# Patient Record
Sex: Male | Born: 1950 | Race: White | Hispanic: No | Smoking: Never smoker
Health system: Southern US, Community
[De-identification: ages and names within clinical notes are randomized; demographics above are authoritative.]

## PROBLEM LIST (undated history)

## (undated) DIAGNOSIS — R51 Headache: Secondary | ICD-10-CM

## (undated) DIAGNOSIS — G8929 Other chronic pain: Secondary | ICD-10-CM

## (undated) DIAGNOSIS — R413 Other amnesia: Secondary | ICD-10-CM

## (undated) DIAGNOSIS — M1990 Unspecified osteoarthritis, unspecified site: Secondary | ICD-10-CM

## (undated) DIAGNOSIS — M549 Dorsalgia, unspecified: Secondary | ICD-10-CM

## (undated) DIAGNOSIS — K922 Gastrointestinal hemorrhage, unspecified: Secondary | ICD-10-CM

## (undated) DIAGNOSIS — Z8719 Personal history of other diseases of the digestive system: Secondary | ICD-10-CM

## (undated) DIAGNOSIS — Z8711 Personal history of peptic ulcer disease: Secondary | ICD-10-CM

## (undated) DIAGNOSIS — F329 Major depressive disorder, single episode, unspecified: Secondary | ICD-10-CM

## (undated) DIAGNOSIS — Z8709 Personal history of other diseases of the respiratory system: Secondary | ICD-10-CM

## (undated) DIAGNOSIS — K219 Gastro-esophageal reflux disease without esophagitis: Secondary | ICD-10-CM

## (undated) DIAGNOSIS — T7840XA Allergy, unspecified, initial encounter: Secondary | ICD-10-CM

## (undated) DIAGNOSIS — Z9289 Personal history of other medical treatment: Secondary | ICD-10-CM

## (undated) DIAGNOSIS — E66813 Obesity, class 3: Secondary | ICD-10-CM

## (undated) DIAGNOSIS — F32A Depression, unspecified: Secondary | ICD-10-CM

## (undated) DIAGNOSIS — I10 Essential (primary) hypertension: Secondary | ICD-10-CM

## (undated) HISTORY — DX: Allergy, unspecified, initial encounter: T78.40XA

## (undated) HISTORY — PX: HAND SURGERY: SHX662

## (undated) HISTORY — PX: ANKLE SURGERY: SHX546

## (undated) HISTORY — PX: LEG SURGERY: SHX1003

## (undated) HISTORY — DX: Major depressive disorder, single episode, unspecified: F32.9

## (undated) HISTORY — DX: Essential (primary) hypertension: I10

## (undated) HISTORY — PX: TIBIA FRACTURE SURGERY: SHX806

## (undated) HISTORY — DX: Morbid (severe) obesity due to excess calories: E66.01

## (undated) HISTORY — DX: Obesity, class 3: E66.813

## (undated) HISTORY — PX: VASECTOMY: SHX75

## (undated) HISTORY — PX: OTHER SURGICAL HISTORY: SHX169

## (undated) HISTORY — PX: FRACTURE SURGERY: SHX138

## (undated) HISTORY — DX: Depression, unspecified: F32.A

## (undated) HISTORY — DX: Personal history of other diseases of the respiratory system: Z87.09

## (undated) HISTORY — PX: BACK SURGERY: SHX140

## (undated) HISTORY — DX: Headache: R51

## (undated) HISTORY — DX: Other amnesia: R41.3

## (undated) HISTORY — PX: TONSILLECTOMY: SUR1361

## (undated) SURGERY — Surgical Case
Anesthesia: *Unknown

---

## 1987-04-01 DIAGNOSIS — K922 Gastrointestinal hemorrhage, unspecified: Secondary | ICD-10-CM

## 1987-04-01 HISTORY — DX: Gastrointestinal hemorrhage, unspecified: K92.2

## 2006-01-02 ENCOUNTER — Ambulatory Visit (HOSPITAL_COMMUNITY): Admission: RE | Admit: 2006-01-02 | Discharge: 2006-01-02 | Payer: Self-pay | Admitting: Anesthesiology

## 2007-07-12 ENCOUNTER — Observation Stay (HOSPITAL_COMMUNITY): Admission: EM | Admit: 2007-07-12 | Discharge: 2007-07-16 | Payer: Self-pay | Admitting: Emergency Medicine

## 2007-07-13 ENCOUNTER — Ambulatory Visit: Payer: Self-pay | Admitting: Surgery

## 2007-07-13 ENCOUNTER — Encounter (INDEPENDENT_AMBULATORY_CARE_PROVIDER_SITE_OTHER): Payer: Self-pay | Admitting: Internal Medicine

## 2007-07-15 ENCOUNTER — Encounter
Admission: RE | Admit: 2007-07-15 | Discharge: 2007-07-15 | Payer: Self-pay | Admitting: Physical Medicine & Rehabilitation

## 2009-12-09 ENCOUNTER — Inpatient Hospital Stay (HOSPITAL_COMMUNITY)
Admission: EM | Admit: 2009-12-09 | Discharge: 2009-12-17 | Disposition: A | Discharge status: Other Acute Inpatient Hospital | Payer: Self-pay | Source: Home / Self Care | Admitting: Emergency Medicine

## 2009-12-09 ENCOUNTER — Ambulatory Visit: Payer: Self-pay | Admitting: Pulmonary Disease

## 2009-12-17 ENCOUNTER — Ambulatory Visit: Payer: Self-pay | Admitting: Psychiatry

## 2009-12-17 ENCOUNTER — Inpatient Hospital Stay (HOSPITAL_COMMUNITY): Admission: EM | Admit: 2009-12-17 | Discharge: 2009-12-19 | Payer: Self-pay | Admitting: Psychiatry

## 2009-12-18 ENCOUNTER — Emergency Department (HOSPITAL_COMMUNITY)
Admission: EM | Admit: 2009-12-18 | Discharge: 2009-12-18 | Disposition: A | Discharge status: Other Acute Inpatient Hospital | Payer: Self-pay | Source: Home / Self Care | Admitting: Emergency Medicine

## 2010-02-13 ENCOUNTER — Ambulatory Visit: Payer: Self-pay | Admitting: Psychiatry

## 2010-06-13 LAB — BASIC METABOLIC PANEL
BUN: 27 mg/dL — ABNORMAL HIGH (ref 6–23)
BUN: 46 mg/dL — ABNORMAL HIGH (ref 6–23)
BUN: 56 mg/dL — ABNORMAL HIGH (ref 6–23)
BUN: 67 mg/dL — ABNORMAL HIGH (ref 6–23)
CO2: 19 mEq/L (ref 19–32)
CO2: 14 mEq/L — ABNORMAL LOW (ref 19–32)
CO2: 16 mEq/L — ABNORMAL LOW (ref 19–32)
CO2: 31 mEq/L (ref 19–32)
CO2: 33 mEq/L — ABNORMAL HIGH (ref 19–32)
Calcium: 8.9 mg/dL (ref 8.4–10.5)
Calcium: 7.3 mg/dL — ABNORMAL LOW (ref 8.4–10.5)
Calcium: 8 mg/dL — ABNORMAL LOW (ref 8.4–10.5)
Chloride: 107 mEq/L (ref 96–112)
Chloride: 103 mEq/L (ref 96–112)
Chloride: 107 mEq/L (ref 96–112)
Chloride: 113 mEq/L — ABNORMAL HIGH (ref 96–112)
Creatinine, Ser: 2.54 mg/dL — ABNORMAL HIGH (ref 0.4–1.5)
Creatinine, Ser: 4.18 mg/dL — ABNORMAL HIGH (ref 0.4–1.5)
Creatinine, Ser: 4.58 mg/dL — ABNORMAL HIGH (ref 0.4–1.5)
Creatinine, Ser: 4.63 mg/dL — ABNORMAL HIGH (ref 0.4–1.5)
GFR calc Af Amer: 16 mL/min — ABNORMAL LOW (ref >60)
GFR calc Af Amer: 21 mL/min — ABNORMAL LOW (ref >60)
GFR calc Af Amer: 32 mL/min — ABNORMAL LOW (ref >60)
GFR calc non Af Amer: 15 mL/min — ABNORMAL LOW (ref >60)
Glucose, Bld: 162 mg/dL — ABNORMAL HIGH (ref 70–99)
Glucose, Bld: 131 mg/dL — ABNORMAL HIGH (ref 70–99)
Glucose, Bld: 163 mg/dL — ABNORMAL HIGH (ref 70–99)
Glucose, Bld: 193 mg/dL — ABNORMAL HIGH (ref 70–99)
Potassium: 3.5 mEq/L (ref 3.5–5.1)
Potassium: 4.2 mEq/L (ref 3.5–5.1)
Sodium: 138 mEq/L (ref 135–145)
Sodium: 141 mEq/L (ref 135–145)

## 2010-06-13 LAB — CBC
HCT: 38.4 % — ABNORMAL LOW (ref 39.0–52.0)
HCT: 30.7 % — ABNORMAL LOW (ref 39.0–52.0)
HCT: 33.3 % — ABNORMAL LOW (ref 39.0–52.0)
HCT: 45.8 % (ref 39.0–52.0)
Hemoglobin: 13.4 g/dL (ref 13.0–17.0)
Hemoglobin: 12.8 g/dL — ABNORMAL LOW (ref 13.0–17.0)
Hemoglobin: 14.7 g/dL (ref 13.0–17.0)
MCH: 29.7 pg (ref 26.0–34.0)
MCH: 29.9 pg (ref 26.0–34.0)
MCH: 30.1 pg (ref 26.0–34.0)
MCHC: 33.6 g/dL (ref 30.0–36.0)
MCHC: 33.6 g/dL (ref 30.0–36.0)
MCHC: 32.1 g/dL (ref 30.0–36.0)
MCV: 91 fL (ref 78.0–100.0)
MCV: 86.7 fL (ref 78.0–100.0)
MCV: 88.6 fL (ref 78.0–100.0)
MCV: 93.9 fL (ref 78.0–100.0)
Platelets: 148 10*3/uL — ABNORMAL LOW (ref 150–400)
Platelets: 161 10*3/uL (ref 150–400)
Platelets: 138 10*3/uL — ABNORMAL LOW (ref 150–400)
Platelets: 226 K/uL (ref 150–400)
RBC: 3.54 MIL/uL — ABNORMAL LOW (ref 4.22–5.81)
RBC: 4.31 MIL/uL (ref 4.22–5.81)
RBC: 4.88 MIL/uL (ref 4.22–5.81)
RDW: 12.9 % (ref 11.5–15.5)
RDW: 13.3 % (ref 11.5–15.5)
RDW: 13.6 % (ref 11.5–15.5)
RDW: 13.8 % (ref 11.5–15.5)
WBC: 8.2 10*3/uL (ref 4.0–10.5)
WBC: 10.5 10*3/uL (ref 4.0–10.5)
WBC: 14.3 10*3/uL — ABNORMAL HIGH (ref 4.0–10.5)
WBC: 9.1 10*3/uL (ref 4.0–10.5)
WBC: 9.6 10*3/uL (ref 4.0–10.5)

## 2010-06-13 LAB — RAPID URINE DRUG SCREEN, HOSP PERFORMED
Amphetamines: NOT DETECTED
Barbiturates: NOT DETECTED
Benzodiazepines: POSITIVE — AB
Cocaine: NOT DETECTED
Opiates: POSITIVE — AB
Tetrahydrocannabinol: NOT DETECTED

## 2010-06-13 LAB — POCT I-STAT 3, ART BLOOD GAS (G3+)
Acid-Base Excess: 11 mmol/L — ABNORMAL HIGH (ref 0.0–2.0)
Acid-base deficit: 13 mmol/L — ABNORMAL HIGH (ref 0.0–2.0)
Acid-base deficit: 15 mmol/L — ABNORMAL HIGH (ref 0.0–2.0)
Acid-base deficit: 16 mmol/L — ABNORMAL HIGH (ref 0.0–2.0)
Acid-base deficit: 16 mmol/L — ABNORMAL HIGH (ref 0.0–2.0)
Acid-base deficit: 8 mmol/L — ABNORMAL HIGH (ref 0.0–2.0)
Bicarbonate: 12.1 mEq/L — ABNORMAL LOW (ref 20.0–24.0)
Bicarbonate: 13.9 meq/L — ABNORMAL LOW (ref 20.0–24.0)
Bicarbonate: 14.4 meq/L — ABNORMAL LOW (ref 20.0–24.0)
Bicarbonate: 14.6 mEq/L — ABNORMAL LOW (ref 20.0–24.0)
Bicarbonate: 15.8 mEq/L — ABNORMAL LOW (ref 20.0–24.0)
Bicarbonate: 35 meq/L — ABNORMAL HIGH (ref 20.0–24.0)
O2 Saturation: 87 %
O2 Saturation: 94 %
O2 Saturation: 98 %
O2 Saturation: 98 %
O2 Saturation: 98 %
O2 Saturation: 99 %
Patient temperature: 37
Patient temperature: 38.6
Patient temperature: 39.4
Patient temperature: 98
Patient temperature: 98.6
Patient temperature: 98.7
TCO2: 13 mmol/L (ref 0–100)
TCO2: 15 mmol/L (ref 0–100)
TCO2: 15 mmol/L (ref 0–100)
TCO2: 16 mmol/L (ref 0–100)
TCO2: 17 mmol/L (ref 0–100)
TCO2: 36 mmol/L (ref 0–100)
pCO2 arterial: 28.5 mmHg — ABNORMAL LOW (ref 35.0–45.0)
pCO2 arterial: 34.3 mmHg — ABNORMAL LOW (ref 35.0–45.0)
pCO2 arterial: 39.9 mmHg (ref 35.0–45.0)
pCO2 arterial: 45 mmHg (ref 35.0–45.0)
pCO2 arterial: 46.5 mmHg — ABNORMAL HIGH (ref 35.0–45.0)
pCO2 arterial: 48.1 mmHg — ABNORMAL HIGH (ref 35.0–45.0)
pH, Arterial: 7.082 — CL (ref 7.350–7.450)
pH, Arterial: 7.092 — CL (ref 7.350–7.450)
pH, Arterial: 7.155 — CL (ref 7.350–7.450)
pH, Arterial: 7.177 — CL (ref 7.350–7.450)
pH, Arterial: 7.359 (ref 7.350–7.450)
pH, Arterial: 7.499 — ABNORMAL HIGH (ref 7.350–7.450)
pO2, Arterial: 133 mmHg — ABNORMAL HIGH (ref 80.0–100.0)
pO2, Arterial: 138 mmHg — ABNORMAL HIGH (ref 80.0–100.0)
pO2, Arterial: 142 mmHg — ABNORMAL HIGH (ref 80.0–100.0)
pO2, Arterial: 73 mmHg — ABNORMAL LOW (ref 80.0–100.0)
pO2, Arterial: 87 mmHg (ref 80.0–100.0)
pO2, Arterial: 96 mmHg (ref 80.0–100.0)

## 2010-06-13 LAB — GLUCOSE, CAPILLARY
Glucose-Capillary: 104 mg/dL — ABNORMAL HIGH (ref 70–99)
Glucose-Capillary: 104 mg/dL — ABNORMAL HIGH (ref 70–99)
Glucose-Capillary: 107 mg/dL — ABNORMAL HIGH (ref 70–99)
Glucose-Capillary: 112 mg/dL — ABNORMAL HIGH (ref 70–99)
Glucose-Capillary: 113 mg/dL — ABNORMAL HIGH (ref 70–99)
Glucose-Capillary: 115 mg/dL — ABNORMAL HIGH (ref 70–99)
Glucose-Capillary: 116 mg/dL — ABNORMAL HIGH (ref 70–99)
Glucose-Capillary: 116 mg/dL — ABNORMAL HIGH (ref 70–99)
Glucose-Capillary: 119 mg/dL — ABNORMAL HIGH (ref 70–99)
Glucose-Capillary: 119 mg/dL — ABNORMAL HIGH (ref 70–99)
Glucose-Capillary: 124 mg/dL — ABNORMAL HIGH (ref 70–99)
Glucose-Capillary: 124 mg/dL — ABNORMAL HIGH (ref 70–99)
Glucose-Capillary: 125 mg/dL — ABNORMAL HIGH (ref 70–99)
Glucose-Capillary: 126 mg/dL — ABNORMAL HIGH (ref 70–99)
Glucose-Capillary: 127 mg/dL — ABNORMAL HIGH (ref 70–99)
Glucose-Capillary: 131 mg/dL — ABNORMAL HIGH (ref 70–99)
Glucose-Capillary: 133 mg/dL — ABNORMAL HIGH (ref 70–99)
Glucose-Capillary: 137 mg/dL — ABNORMAL HIGH (ref 70–99)
Glucose-Capillary: 139 mg/dL — ABNORMAL HIGH (ref 70–99)
Glucose-Capillary: 140 mg/dL — ABNORMAL HIGH (ref 70–99)
Glucose-Capillary: 142 mg/dL — ABNORMAL HIGH (ref 70–99)
Glucose-Capillary: 147 mg/dL — ABNORMAL HIGH (ref 70–99)
Glucose-Capillary: 152 mg/dL — ABNORMAL HIGH (ref 70–99)
Glucose-Capillary: 180 mg/dL — ABNORMAL HIGH (ref 70–99)
Glucose-Capillary: 245 mg/dL — ABNORMAL HIGH (ref 70–99)
Glucose-Capillary: 92 mg/dL (ref 70–99)
Glucose-Capillary: 103 mg/dL — ABNORMAL HIGH (ref 70–99)
Glucose-Capillary: 104 mg/dL — ABNORMAL HIGH (ref 70–99)
Glucose-Capillary: 111 mg/dL — ABNORMAL HIGH (ref 70–99)
Glucose-Capillary: 113 mg/dL — ABNORMAL HIGH (ref 70–99)
Glucose-Capillary: 114 mg/dL — ABNORMAL HIGH (ref 70–99)
Glucose-Capillary: 114 mg/dL — ABNORMAL HIGH (ref 70–99)
Glucose-Capillary: 125 mg/dL — ABNORMAL HIGH (ref 70–99)
Glucose-Capillary: 127 mg/dL — ABNORMAL HIGH (ref 70–99)
Glucose-Capillary: 146 mg/dL — ABNORMAL HIGH (ref 70–99)
Glucose-Capillary: 146 mg/dL — ABNORMAL HIGH (ref 70–99)
Glucose-Capillary: 153 mg/dL — ABNORMAL HIGH (ref 70–99)
Glucose-Capillary: 163 mg/dL — ABNORMAL HIGH (ref 70–99)
Glucose-Capillary: 171 mg/dL — ABNORMAL HIGH (ref 70–99)
Glucose-Capillary: 172 mg/dL — ABNORMAL HIGH (ref 70–99)
Glucose-Capillary: 180 mg/dL — ABNORMAL HIGH (ref 70–99)
Glucose-Capillary: 180 mg/dL — ABNORMAL HIGH (ref 70–99)
Glucose-Capillary: 184 mg/dL — ABNORMAL HIGH (ref 70–99)
Glucose-Capillary: 186 mg/dL — ABNORMAL HIGH (ref 70–99)
Glucose-Capillary: 192 mg/dL — ABNORMAL HIGH (ref 70–99)
Glucose-Capillary: 88 mg/dL (ref 70–99)
Glucose-Capillary: 95 mg/dL (ref 70–99)
Glucose-Capillary: 95 mg/dL (ref 70–99)
Glucose-Capillary: 97 mg/dL (ref 70–99)

## 2010-06-13 LAB — CARDIAC PANEL(CRET KIN+CKTOT+MB+TROPI)
CK, MB: 71.3 ng/mL (ref 0.3–4.0)
CK, MB: 99.4 ng/mL (ref 0.3–4.0)
Relative Index: 0.3 (ref 0.0–2.5)
Relative Index: 1.5 (ref 0.0–2.5)
Total CK: 564 U/L — ABNORMAL HIGH (ref 7–232)
Total CK: 4669 U/L — ABNORMAL HIGH (ref 7–232)
Total CK: 5519 U/L — ABNORMAL HIGH (ref 7–232)
Total CK: 6094 U/L — ABNORMAL HIGH (ref 7–232)
Troponin I: 0.12 ng/mL — ABNORMAL HIGH (ref 0.00–0.06)
Troponin I: 0.72 ng/mL (ref 0.00–0.06)

## 2010-06-13 LAB — URINALYSIS, ROUTINE W REFLEX MICROSCOPIC
Bilirubin Urine: NEGATIVE
Bilirubin Urine: NEGATIVE
Glucose, UA: 100 mg/dL — AB
Glucose, UA: NEGATIVE mg/dL
Ketones, ur: NEGATIVE mg/dL
Leukocytes, UA: NEGATIVE
Nitrite: NEGATIVE
Nitrite: NEGATIVE
Protein, ur: >300 mg/dL — AB
Specific Gravity, Urine: 1.014 (ref 1.005–1.030)
Specific Gravity, Urine: 1.014 (ref 1.005–1.030)
Urobilinogen, UA: 1 mg/dL (ref 0.0–1.0)
pH: 6 (ref 5.0–8.0)
pH: 6 (ref 5.0–8.0)

## 2010-06-13 LAB — RENAL FUNCTION PANEL
Albumin: 3 g/dL — ABNORMAL LOW (ref 3.5–5.2)
Albumin: 3.1 g/dL — ABNORMAL LOW (ref 3.5–5.2)
BUN: 48 mg/dL — ABNORMAL HIGH (ref 6–23)
BUN: 66 mg/dL — ABNORMAL HIGH (ref 6–23)
CO2: 29 mEq/L (ref 19–32)
CO2: 29 mEq/L (ref 19–32)
CO2: 32 mEq/L (ref 19–32)
CO2: 34 mEq/L — ABNORMAL HIGH (ref 19–32)
Calcium: 8.5 mg/dL (ref 8.4–10.5)
Calcium: 8.5 mg/dL (ref 8.4–10.5)
Calcium: 8.5 mg/dL (ref 8.4–10.5)
Calcium: 8.2 mg/dL — ABNORMAL LOW (ref 8.4–10.5)
Chloride: 103 meq/L (ref 96–112)
Chloride: 96 mEq/L (ref 96–112)
Creatinine, Ser: 1.79 mg/dL — ABNORMAL HIGH (ref 0.4–1.5)
Creatinine, Ser: 4.07 mg/dL — ABNORMAL HIGH (ref 0.4–1.5)
Creatinine, Ser: 4.41 mg/dL — ABNORMAL HIGH (ref 0.4–1.5)
GFR calc Af Amer: 18 mL/min — ABNORMAL LOW (ref >60)
GFR calc Af Amer: 28 mL/min — ABNORMAL LOW (ref >60)
GFR calc Af Amer: 47 mL/min — ABNORMAL LOW (ref >60)
GFR calc Af Amer: 17 mL/min — ABNORMAL LOW (ref >60)
GFR calc non Af Amer: 23 mL/min — ABNORMAL LOW (ref >60)
GFR calc non Af Amer: 39 mL/min — ABNORMAL LOW (ref >60)
GFR calc non Af Amer: 14 mL/min — ABNORMAL LOW (ref >60)
Glucose, Bld: 137 mg/dL — ABNORMAL HIGH (ref 70–99)
Glucose, Bld: 91 mg/dL (ref 70–99)
Glucose, Bld: 114 mg/dL — ABNORMAL HIGH (ref 70–99)
Phosphorus: 3.5 mg/dL (ref 2.3–4.6)
Phosphorus: 4.6 mg/dL (ref 2.3–4.6)
Phosphorus: 6.5 mg/dL — ABNORMAL HIGH (ref 2.3–4.6)
Potassium: 3 mEq/L — ABNORMAL LOW (ref 3.5–5.1)
Sodium: 136 mEq/L (ref 135–145)
Sodium: 142 mEq/L (ref 135–145)

## 2010-06-13 LAB — COMPREHENSIVE METABOLIC PANEL
ALT: 47 U/L (ref 0–53)
ALT: 50 U/L (ref 0–53)
ALT: 54 U/L — ABNORMAL HIGH (ref 0–53)
AST: 57 U/L — ABNORMAL HIGH (ref 0–37)
Alkaline Phosphatase: 50 U/L (ref 39–117)
Alkaline Phosphatase: 62 U/L (ref 39–117)
Alkaline Phosphatase: 81 U/L (ref 39–117)
BUN: 52 mg/dL — ABNORMAL HIGH (ref 6–23)
CO2: 20 mEq/L (ref 19–32)
CO2: 26 mEq/L (ref 19–32)
CO2: 31 mEq/L (ref 19–32)
Calcium: 8.4 mg/dL (ref 8.4–10.5)
Chloride: 109 mEq/L (ref 96–112)
GFR calc Af Amer: 24 mL/min — ABNORMAL LOW (ref >60)
GFR calc non Af Amer: 13 mL/min — ABNORMAL LOW (ref >60)
GFR calc non Af Amer: 13 mL/min — ABNORMAL LOW (ref >60)
GFR calc non Af Amer: 20 mL/min — ABNORMAL LOW (ref >60)
Glucose, Bld: 101 mg/dL — ABNORMAL HIGH (ref 70–99)
Glucose, Bld: 159 mg/dL — ABNORMAL HIGH (ref 70–99)
Potassium: 2.7 mEq/L — CL (ref 3.5–5.1)
Potassium: 3.1 mEq/L — ABNORMAL LOW (ref 3.5–5.1)
Potassium: 5.4 mEq/L — ABNORMAL HIGH (ref 3.5–5.1)
Sodium: 139 mEq/L (ref 135–145)
Sodium: 141 mEq/L (ref 135–145)
Sodium: 144 mEq/L (ref 135–145)
Total Bilirubin: 0.4 mg/dL (ref 0.3–1.2)
Total Bilirubin: 0.7 mg/dL (ref 0.3–1.2)

## 2010-06-13 LAB — BLOOD GAS, ARTERIAL
Bicarbonate: 14.2 mEq/L — ABNORMAL LOW (ref 20.0–24.0)
Bicarbonate: 30.6 mEq/L — ABNORMAL HIGH (ref 20.0–24.0)
MECHVT: 620 mL
O2 Saturation: 98.9 %
Patient temperature: 102
TCO2: 15.1 mmol/L (ref 0–100)
TCO2: 31.5 mmol/L (ref 0–100)
pCO2 arterial: 28.6 mmHg — ABNORMAL LOW (ref 35.0–45.0)
pH, Arterial: 7.281 — ABNORMAL LOW (ref 7.350–7.450)
pH, Arterial: 7.632 (ref 7.350–7.450)

## 2010-06-13 LAB — CULTURE, BLOOD (ROUTINE X 2)

## 2010-06-13 LAB — DIFFERENTIAL
Basophils Absolute: 0 K/uL (ref 0.0–0.1)
Basophils Relative: 0 % (ref 0–1)
Basophils Relative: 0 % (ref 0–1)
Eosinophils Absolute: 0 10*3/uL (ref 0.0–0.7)
Eosinophils Absolute: 0 10*3/uL (ref 0.0–0.7)
Eosinophils Relative: 0 % (ref 0–5)
Lymphocytes Relative: 10 % — ABNORMAL LOW (ref 12–46)
Lymphs Abs: 1.4 10*3/uL (ref 0.7–4.0)
Monocytes Absolute: 0.5 K/uL (ref 0.1–1.0)
Monocytes Relative: 4 % (ref 3–12)
Neutro Abs: 12.3 K/uL — ABNORMAL HIGH (ref 1.7–7.7)
Neutrophils Relative %: 75 % (ref 43–77)
Neutrophils Relative %: 86 % — ABNORMAL HIGH (ref 43–77)

## 2010-06-13 LAB — CARBOXYHEMOGLOBIN
Carboxyhemoglobin: 0.4 % — ABNORMAL LOW (ref 0.5–1.5)
Methemoglobin: 0.7 % (ref 0.0–1.5)
O2 Saturation: 82.3 %
Total hemoglobin: 14.5 g/dL (ref 13.5–18.0)

## 2010-06-13 LAB — URINE MICROSCOPIC-ADD ON

## 2010-06-13 LAB — COMPREHENSIVE METABOLIC PANEL WITH GFR
Albumin: 3.7 g/dL (ref 3.5–5.2)
BUN: 24 mg/dL — ABNORMAL HIGH (ref 6–23)
Creatinine, Ser: 3.21 mg/dL — ABNORMAL HIGH (ref 0.4–1.5)
Glucose, Bld: 91 mg/dL (ref 70–99)
Total Bilirubin: 0.6 mg/dL (ref 0.3–1.2)
Total Protein: 7 g/dL (ref 6.0–8.3)

## 2010-06-13 LAB — POCT CARDIAC MARKERS
CKMB, poc: 20.7 ng/mL (ref 1.0–8.0)
Myoglobin, poc: >500 ng/mL (ref 12–200)
Troponin i, poc: <0.05 ng/mL (ref 0.00–0.09)

## 2010-06-13 LAB — CULTURE, BAL-QUANTITATIVE W GRAM STAIN: Colony Count: 45000

## 2010-06-13 LAB — SODIUM, URINE, RANDOM: Sodium, Ur: 39 mEq/L

## 2010-06-13 LAB — APTT: aPTT: 38 s — ABNORMAL HIGH (ref 24–37)

## 2010-06-13 LAB — MAGNESIUM: Magnesium: 1.8 mg/dL (ref 1.5–2.5)

## 2010-06-13 LAB — STREP PNEUMONIAE URINARY ANTIGEN: Strep Pneumo Urinary Antigen: POSITIVE — AB

## 2010-06-13 LAB — SALICYLATE LEVEL: Salicylate Lvl: <4 mg/dL (ref 2.8–20.0)

## 2010-06-13 LAB — TRICYCLICS SCREEN, URINE: TCA Scrn: NOT DETECTED

## 2010-06-13 LAB — MRSA PCR SCREENING: MRSA by PCR: NEGATIVE

## 2010-06-13 LAB — CK TOTAL AND CKMB (NOT AT ARMC)
CK, MB: 26.9 ng/mL (ref 0.3–4.0)
Total CK: 1572 U/L — ABNORMAL HIGH (ref 7–232)

## 2010-06-13 LAB — ETHANOL: Alcohol, Ethyl (B): <5 mg/dL (ref 0–10)

## 2010-06-13 LAB — PROTIME-INR
INR: 1.22 (ref 0.00–1.49)
Prothrombin Time: 15.6 seconds — ABNORMAL HIGH (ref 11.6–15.2)

## 2010-06-13 LAB — PHOSPHORUS: Phosphorus: 4 mg/dL (ref 2.3–4.6)

## 2010-06-13 LAB — ACETAMINOPHEN LEVEL: Acetaminophen (Tylenol), Serum: <10 ug/mL — ABNORMAL LOW (ref 10–30)

## 2010-08-13 NOTE — Discharge Summary (Signed)
NAME:  Randy Grimes, Randy Grimes               ACCOUNT NO.:  [id number]   MEDICAL RECORD NO.:  [id number]          PATIENT TYPE:  INP   LOCATION:  3703                         FACILITY:  [facility]   PHYSICIAN:  Mobolaji B. Bakare, M.D.DATE OF BIRTH:  [birth year]   DATE OF ADMISSION:  07/12/2007  DATE OF DISCHARGE:  07/16/2007                               DISCHARGE SUMMARY   PRIMARY CARE PHYSICIAN:  Marjory Lies, M.D.   FINAL DIAGNOSES:  1. Syncope, etiology unclear.  2. Headache and tinnitus.  3. Hypertension.  4. Chronic back pain/chronic pain syndrome.   PROCEDURES:  1. Head CT scan done on July 12, 2007, showed no evidence of acute      intracranial abnormality.  Lumbar spine x-ray showed age      indeterminate L1 and mild superior endplate compression fracture.      MRI of the lumbar spine done on July 14, 2007, showed old healed      minimal compression deformity at the superior endplate of L1 minor      disk degenerative changes in the lower lumbar region.  No apparent      neural compression.  MRI done on July 15, 2007, showed no acute      intracranial abnormality.  Previous right maxillary sinus surgery.      MRA showed normal variant in the circle of Willis.  2. A 2D echocardiogram done on the July 13, 2007, showed normal left      ventricular systolic function.  Ejection fraction of 55%-65%.  No      significant valvular abnormality.  There was an increased relative      contribution of atrial contraction to the left ventricular filling.  3. Carotid Dopplers showed no significant internal carotid artery      stenosis.  Vertebral artery flow antegrade bilaterally.   BRIEF HISTORY:  Please refer to the admission H&P for full details.  In  brief, Randy Grimes was found on the floor by his wife and he was brought  to the emergency room.  The patient could not tell what happened.  It  was presumed he had a syncopal episode.  The patient reported previous  episodes of fall 2 weeks  prior to hospitalization, but not associated  with loss of consciousness.  He did not go to the hospital at that time.  Subsequent to this fall, he has developed worsening of his chronic  headaches and ringing in both ears.   Examination of both ears were unrevealing.  The tympanic membranes were  intact and no evidence of middle ear infection.  Initial vitals on  admission were blood pressure 155/85, pulse of 61, respiratory rate of  16, O2 sats 100% on room air, and temperature of 97.1.  Physical  examination was unremarkable.  No focal neurological deficit.  The  patient was admitted for further evaluation.  His initial laboratory  data was essentially within normal except for a lumbosacral x-ray, which  showed question of an age indeterminate L1 and endplate compression  fracture.  The patient was admitted to telemetry floor for further  evaluation and  treatment.   HOSPITAL COURSE:  1. Syncope.  Syncopal workup was essentially negative.  A 2D      echocardiogram which showed normal left ventricular systolic      function and no valvular abnormality.  Carotid Dopplers were      normal.  Head CT scan was normal.  He had no malignant arrhythmia      on telemetry.  He ruled out for myocardial infarction with 3      negative sets of cardiac enzymes.  The patient had no chest pain to      suggest cardiac origin.  No telemetry evidence of abnormal rhythm      to account for syncopal episode.  He had an EEG, which again was      normal.  The patient had no recurrent episode of syncope while in      the hospital.  2. Headache and tinnitus.  He described frontal headaches bilaterally      without any visual symptoms.  He had some cervical pain as well,      which seems to be an acute on chronic problem.  This recent acuity      was a result of a recent fall about 2 weeks ago, and he tells these      headaches come associated with tinnitus.  We obtained an MRA of the      head to rule out  cerebellopontine angle tumor and aneurysm.  This      study was essentially negative.  3. Hypertension.  Blood pressure was controlled with Norvasc.  4. Chronic back pain/chronic pain syndrome.  The patient reports that      he has been referred to the pain clinic.  He had some issues with      his Duragesic patch.  He was given p.r.n. OxyIR during this      hospitalization and also received one-time dose of Duragesic patch.   DISCHARGE MEDICATIONS:  1. Norvasc 10 mg daily.  2. Fentanyl patch to use as before.  3. Protonix 40 mg daily.  4. Hydrocodone 10/325 p.r.n.   DISCHARGE INSTRUCTIONS:  1. Follow up with Dr. Doristine Counter in 1 week.  2. Follow up with Dr. Joneen Boers at [facility]      (neurologist).  3. Follow up with ENT physician if tinnitus persists.   DISCHARGE LABORATORY DATA:  White cell 5.9, hemoglobin 14.3, hematocrit  41.4, and platelets 220.  LFTs were normal.  Sodium 139, potassium 3.7,  BUN 7, creatinine 0.79.      Mobolaji B. Corky Downs, M.D.  Electronically Signed     MBB/MEDQ  D:  07/16/2007  T:  07/17/2007  Job:  [id number]   cc:   Marjory Lies, M.D.  Joneen Boers, M.D.

## 2010-08-13 NOTE — Procedures (Signed)
EEG NUMBER:  [id number]   HISTORY:  This is a 60 year old who is being evaluated for a period of  unresponsiveness.  The patient was being evaluated for possible  seizures.   This is a portable EEG recording.  No skull defects were noted.   MEDICATIONS:  Include fentanyl and hydrocodone.   EEG CLASSIFICATION:  Normal weight.   DESCRIPTION OF RECORDING:  Background rhythm of this recording consists  of fairly well modulated medium amplitude alpha rhythm of 9 Hz that is  reactive to eye opening and closure.  As the record progresses, the  patient appears to remain in the waking state during the recording.  Photic stimulation is performed resulting in excellent bilaterally  symmetric photic drive response.  Hyperventilation is also performed  resulting in a minimal buildup of background rhythm activity without  significant slowing seen.  At no time during the recording does there  appear to be evidence of spikes, spike wave discharges, or evidence of  focal slowing.  EKG monitor shows no evidence of cardiac rhythm  abnormalities with a heart rate of 78.   IMPRESSION:  This is a normal EEG recording in the waking state.  No  evidence of ictal or interictal discharges were seen.      Marlan Palau, M.D.  Electronically Signed     EAV:[id number]  D:  07/13/2007 16:30:20  T:  07/13/2007 17:23:36  Job #:  [id number]
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
## 2010-11-29 ENCOUNTER — Emergency Department (HOSPITAL_COMMUNITY)
Admission: EM | Admit: 2010-11-29 | Discharge: 2010-11-30 | Disposition: A | Discharge status: Home / Self Care | Payer: Medicare Other | Attending: Emergency Medicine | Admitting: Emergency Medicine

## 2010-11-29 ENCOUNTER — Other Ambulatory Visit: Payer: Self-pay | Admitting: Family Medicine

## 2010-11-29 ENCOUNTER — Ambulatory Visit
Admission: RE | Admit: 2010-11-29 | Discharge: 2010-11-29 | Disposition: A | Discharge status: Home / Self Care | Payer: Medicare Other | Source: Ambulatory Visit | Attending: Family Medicine | Admitting: Family Medicine

## 2010-11-29 DIAGNOSIS — R51 Headache: Secondary | ICD-10-CM | POA: Insufficient documentation

## 2010-11-29 LAB — POCT I-STAT, CHEM 8
BUN: 12 mg/dL (ref 6–23)
Chloride: 103 mEq/L (ref 96–112)
Creatinine, Ser: 1 mg/dL (ref 0.50–1.35)
Glucose, Bld: 96 mg/dL (ref 70–99)
Potassium: 3.7 mEq/L (ref 3.5–5.1)
Sodium: 139 mEq/L (ref 135–145)

## 2010-12-24 LAB — COMPREHENSIVE METABOLIC PANEL
Albumin: 3.7
Alkaline Phosphatase: 57
BUN: 7
CO2: 27
Chloride: 102
Glucose, Bld: 114 — ABNORMAL HIGH
Total Protein: 6.7

## 2010-12-24 LAB — CBC
HCT: 41.4
Hemoglobin: 14.8
Hemoglobin: 14.3
MCHC: 33.8
MCHC: 34.6
MCV: 88.4
MCV: 89
RBC: 4.95
RBC: 4.65
WBC: 6.1

## 2010-12-24 LAB — CARDIAC PANEL(CRET KIN+CKTOT+MB+TROPI)
CK, MB: 1.7
CK, MB: 1.8
CK, MB: 2
CK, MB: 2
Relative Index: INVALID
Total CK: 60
Total CK: 66
Troponin I: 0.01
Troponin I: 0.01
Troponin I: 0.01
Troponin I: 0.01

## 2010-12-24 LAB — POCT CARDIAC MARKERS
CKMB, poc: 2.9
CKMB, poc: 3.4
Myoglobin, poc: 99.9
Troponin i, poc: <0.05

## 2010-12-24 LAB — DIFFERENTIAL
Basophils Relative: 1
Monocytes Absolute: 0.3
Monocytes Relative: 4
Neutro Abs: 4.5

## 2010-12-24 LAB — CK TOTAL AND CKMB (NOT AT ARMC)
CK, MB: 3.3
Relative Index: 2.9 — ABNORMAL HIGH
Total CK: 115

## 2010-12-24 LAB — RAPID URINE DRUG SCREEN, HOSP PERFORMED
Amphetamines: NOT DETECTED
Barbiturates: NOT DETECTED
Opiates: POSITIVE — AB

## 2011-01-02 ENCOUNTER — Encounter: Payer: Medicare Other | Admitting: *Deleted

## 2011-01-29 ENCOUNTER — Encounter: Payer: Self-pay | Admitting: *Deleted

## 2011-01-29 ENCOUNTER — Encounter: Payer: Medicare Other | Attending: Family Medicine | Admitting: *Deleted

## 2011-01-29 DIAGNOSIS — Z713 Dietary counseling and surveillance: Secondary | ICD-10-CM | POA: Insufficient documentation

## 2011-01-29 NOTE — Patient Instructions (Addendum)
Goals:  Aim for 1600 calories and 3 meals per day - NO meal skipping.   Limit carbohydrate to 4 servings/meal   Choose more whole grains, lean protein, low-fat dairy, and fruits/non-starchy vegetables.   Aim for >30 min of physical activity daily  Avoid fried and high sodium foods  Call or email me with any questions
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
## 2011-04-17 ENCOUNTER — Encounter: Payer: Medicare Other | Attending: Family Medicine | Admitting: *Deleted

## 2011-04-17 ENCOUNTER — Encounter: Payer: Self-pay | Admitting: *Deleted

## 2011-04-17 DIAGNOSIS — Z713 Dietary counseling and surveillance: Secondary | ICD-10-CM | POA: Insufficient documentation

## 2011-04-17 NOTE — Patient Instructions (Addendum)
Goals:  Continue previous goals.  Decrease soda and refined sugar intake.  Follow up in 4 weeks.

## 2011-04-17 NOTE — Progress Notes (Signed)
Medical Nutrition Therapy:  Appt start time: 10:00  end time:  10:30.  Primary Concerns Today:  Morbid Obesity, follow up. Pt here for follow up with wt gain of ~8 lbs since last visit (01/29/11). Continues to consume regular sodas (32 oz/day) and now consumes 3 meals daily. States an interest in gastric sleeve surgery and reports attending seminar last month. Wife and son both had R&Y several yrs ago and pt states he is familiar with gastric surgery.  Reports walking 2x/day for ~20 min each.  Flat affect continues.  Gave pt AB's Pre-Op goals handout to start practicing during his 6 month supervised wt loss.  MEDICATIONS: See medication list; reconciled with patient   DIETARY INTAKE:  Usual eating pattern includes 2-3 meals and 0 snacks per day.   24-hr recall:  B (AM): 4 pc toast (wheat); coffee (w/ cream and "a few tsp of sugar") Snk (AM): 8 oz soda L (PM): Bowl of soup or sandwich; soda (8 oz) Snk (PM): 8 oz soda D ( PM): Smaller portions of "whatever is served"; sprite (8 oz) Snk ( PM): water Beverages: water, coffee, (2) 16 oz soda  Usual physical activity:  Walking 2x/day @ 20 min  Estimated energy needs: 1600 calories 200 g carbohydrates 100 g protein 45 g fat  Progress Towards Goal(s):  In progress.   Nutritional Diagnosis:  [place]-3.3 Morbid obesity related to excessive CHO intake and daily meal skipping as evidenced by 24-hour food recall and a BMI of 42.2 kg/m^2.    Intervention/Goals:  Continue previous goals.  Decrease soda and refined sugar intake.  Follow up in 4 weeks.  Monitoring/Evaluation:  Dietary intake, exercise, and body weight in 4 weeks.

## 2011-05-15 ENCOUNTER — Encounter: Payer: Self-pay | Admitting: *Deleted

## 2011-05-15 ENCOUNTER — Encounter: Payer: Medicare Other | Attending: Family Medicine | Admitting: *Deleted

## 2011-05-15 DIAGNOSIS — Z713 Dietary counseling and surveillance: Secondary | ICD-10-CM | POA: Insufficient documentation

## 2011-05-15 NOTE — Progress Notes (Signed)
Medical Nutrition Therapy:  Appt start time: 10:00  end time:  10:30.  Primary Concerns Today:  Morbid Obesity, follow up. Pt here for f/u with a wt loss of ~ 4 lbs. Still interested in gastric sleeve and currently in process of filling out paperwork for [facility].  Has decreased soda intake by 80% and drinks mainly diet soda.  No longer skipping meals.  Increase in exercise to 30 min 2x/day noted. Pain reported "all over". States he has chronic pain that flares off and on.   MEDICATIONS: See medication list; No changes reported.   DIETARY INTAKE:  Usual eating pattern includes 3 meals and 0 snacks per day.   Usual physical activity:  Walking 2x/day @ 30-45 min  Estimated energy needs: 1600 calories 200 g carbohydrates 100 g protein 45 g fat  Progress Towards Goal(s):  In progress.   Nutritional Diagnosis:  [place]-3.3 Morbid obesity related to excessive CHO intake and daily meal skipping as evidenced by 24-hour food recall and a BMI of 42.2 kg/m^2.    Intervention/Goals:  Continue previous goals.  Contact Carol @ [facility] about insurance requirements for supervised weight loss.  Follow up in 4 weeks.  Monitoring/Evaluation:  Dietary intake, exercise, and body weight in 4 weeks.

## 2011-05-15 NOTE — Patient Instructions (Signed)
Goals:  Continue previous goals.  Contact Carol @ [facility] about insurance requirements for supervised weight loss.  Follow up in 4 weeks.

## 2011-06-12 ENCOUNTER — Encounter: Payer: Self-pay | Admitting: *Deleted

## 2011-06-12 ENCOUNTER — Encounter: Payer: Medicare Other | Attending: Family Medicine | Admitting: *Deleted

## 2011-06-12 DIAGNOSIS — Z713 Dietary counseling and surveillance: Secondary | ICD-10-CM | POA: Insufficient documentation

## 2011-06-12 NOTE — Patient Instructions (Signed)
Goals:  Continue previous goals.  Follow up in 4 weeks.

## 2011-06-12 NOTE — Progress Notes (Signed)
Medical Nutrition Therapy:  Appt start time: 10:00  end time:  10:30.  Primary Concerns Today:  Morbid Obesity, follow up. Pt here for f/u with a wt loss of ~2 lbs.  Continues decreased soda intake and meal skipping.  Exercise has decreased to 20 min 2 times/day d/t pain in back, leg, and foot. Also c/o headaches that he reports are chronic. Pain today of 4/5.   MEDICATIONS: See medication list; No changes reported.   DIETARY INTAKE:  Usual eating pattern includes 3 meals and 0 snacks per day.   Usual physical activity:  Walking 2x/day @ 20 min  Estimated energy needs: 1600 calories 200 g carbohydrates 100 g protein 45 g fat  Progress Towards Goal(s):  In progress.   Nutritional Diagnosis:  [place]-3.3 Morbid obesity related to excessive CHO intake and daily meal skipping as evidenced by 24-hour food recall and a BMI of 42.2 kg/m^2.    Intervention/Goals:  Continue previous goals.  Follow up in 4 weeks.  Monitoring/Evaluation:  Dietary intake, exercise, and body weight in 4 weeks.

## 2011-07-10 ENCOUNTER — Encounter: Payer: Medicare Other | Attending: Family Medicine | Admitting: *Deleted

## 2011-07-10 ENCOUNTER — Encounter: Payer: Self-pay | Admitting: *Deleted

## 2011-07-10 DIAGNOSIS — Z713 Dietary counseling and surveillance: Secondary | ICD-10-CM | POA: Insufficient documentation

## 2011-07-10 NOTE — Progress Notes (Signed)
Medical Nutrition Therapy:  Appt start time: 10:00  end time:  10:15.  Primary Concerns Today:  Morbid Obesity, follow up. Pt here for f/u with a wt loss of ~1 lbs.  Reports pain in back and leg d/t working in the yard yesterday. Pain continues to be a 4 out of 5.  Also reports drinking Ensure as replacement for 1 meal so he won't skip. Continues to walk a few times a week in addition to working in the yard. Will increase as weather continues to get better.   MEDICATIONS: See medication list; No changes reported.   DIETARY INTAKE:  Usual eating pattern includes 3 meals and 0 snacks per day. Ensure as a meal.   Usual physical activity:  Walking 2x/day @ 20 min; working in the yard  Estimated energy needs: 1600 calories 200 g carbohydrates 100 g protein 45 g fat  Progress Towards Goal(s):  In progress.   Nutritional Diagnosis:  [place]-3.3 Morbid obesity related to excessive CHO intake and daily meal skipping as evidenced by 24-hour food recall and a BMI of 42.2 kg/m^2.    Intervention/Goals:  Continue previous goals.  Follow up in 4 weeks.  Monitoring/Evaluation:  Dietary intake, exercise, and body weight in 4 weeks.

## 2011-07-10 NOTE — Patient Instructions (Signed)
Goals:  Continue previous goals.  Follow up in 4 weeks.

## 2011-08-12 ENCOUNTER — Encounter: Payer: Medicare Other | Attending: Family Medicine | Admitting: *Deleted

## 2011-08-12 ENCOUNTER — Encounter: Payer: Self-pay | Admitting: *Deleted

## 2011-08-12 DIAGNOSIS — Z713 Dietary counseling and surveillance: Secondary | ICD-10-CM | POA: Insufficient documentation

## 2011-08-12 NOTE — Patient Instructions (Addendum)
Goals:  Continue previous goals.  Switch to 1% or 2% milk.  Follow up in 4 weeks.

## 2011-08-12 NOTE — Progress Notes (Signed)
Medical Nutrition Therapy:  Appt start time: 10:00  end time:  10:15.  Primary Concerns Today:  Morbid Obesity, follow up. Pt here for 5th month of supervised weight loss with a wt gain of ~10 lbs. Reports drinking 1/2 gallon of whole milk daily. 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 Waiting on approval from worker's comp for surgery to adjust. Pain continues to be a 4 out of 5.  Has started working again and moving 45 lb packs there. States he is lifting from knees, not back. Still plans to do the sleeve with Dr. Biagio Quint.    MEDICATIONS: See medication list; No changes reported.   DIETARY INTAKE:  Usual eating pattern includes 3 meals and 0-1 snacks per day. Ensure as a meal or snack.   Usual physical activity:  Working in the yard; lifts 45 lb packs at work.  Estimated energy needs: 1600 calories 200 g carbohydrates 100 g protein 45 g fat  Progress Towards Goal(s):  In progress.   Nutritional Diagnosis:  [place]-3.3 Morbid obesity related to excessive CHO intake and daily meal skipping as evidenced by 24-hour food recall and a BMI of 42.2 kg/m^2.    Intervention/Goals:  Continue previous goals.  Change to 1-2% milk.   Follow up in 4 weeks.  Monitoring/Evaluation:  Dietary intake, exercise, and body weight in 4 weeks.

## 2011-09-05 ENCOUNTER — Encounter: Payer: Self-pay | Admitting: *Deleted

## 2011-09-05 ENCOUNTER — Encounter: Payer: Medicare Other | Attending: Family Medicine | Admitting: *Deleted

## 2011-09-05 DIAGNOSIS — Z713 Dietary counseling and surveillance: Secondary | ICD-10-CM | POA: Insufficient documentation

## 2011-09-05 NOTE — Patient Instructions (Addendum)
Goals:  Continue previous goals.  Switch to 1% or 2% milk  Contact Carol at [facility] for next step in process.    Begin to practice Pre-Op Goals.

## 2011-09-05 NOTE — Progress Notes (Signed)
Medical Nutrition Therapy:  Appt start time: 10:00  end time:  10:15.  Primary Concerns Today:  Morbid Obesity, follow up. Pt here for 6th month of supervised weight loss with a wt gain of ~10 lbs. Reports drinking 1 quart of whole milk daily. Also reports continued pain in back and leg because surgery for the control unit of his spinal cord stimulator has not been scheduled yet. Unable to exercise.    MEDICATIONS: See medication list; No changes reported.   DIETARY INTAKE:  Usual eating pattern includes 3 meals and 0-1 snacks per day. Ensure as a meal or snack.   Usual physical activity:  Working in the yard; lifts 45 lb packs at work.  Estimated energy needs: 1600 calories 200 g carbohydrates 100 g protein 45 g fat  Progress Towards Goal(s):  In progress.   Nutritional Diagnosis:  [place]-3.3 Morbid obesity related to excessive CHO intake and daily meal skipping as evidenced by 24-hour food recall and a BMI of 42.2 kg/m^2.    Intervention/Goals:  Continue previous goals.  Change to 1-2% milk.   Contact Carol at [facility] for next step in process.   Monitoring/Evaluation:  Dietary intake, exercise, and body weight in TBD per [facility].

## 2012-02-12 ENCOUNTER — Ambulatory Visit (INDEPENDENT_AMBULATORY_CARE_PROVIDER_SITE_OTHER): Payer: Medicare Other | Admitting: General Surgery

## 2012-02-12 ENCOUNTER — Encounter (INDEPENDENT_AMBULATORY_CARE_PROVIDER_SITE_OTHER): Payer: Self-pay | Admitting: General Surgery

## 2012-02-12 DIAGNOSIS — Z6841 Body Mass Index (BMI) 40.0 and over, adult: Secondary | ICD-10-CM

## 2012-02-12 DIAGNOSIS — K279 Peptic ulcer, site unspecified, unspecified as acute or chronic, without hemorrhage or perforation: Secondary | ICD-10-CM
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
## 2012-02-18 ENCOUNTER — Encounter (INDEPENDENT_AMBULATORY_CARE_PROVIDER_SITE_OTHER): Payer: Self-pay

## 2012-02-18 ENCOUNTER — Other Ambulatory Visit: Payer: Self-pay | Admitting: Family Medicine

## 2012-02-18 ENCOUNTER — Ambulatory Visit
Admission: RE | Admit: 2012-02-18 | Discharge: 2012-02-18 | Disposition: A | Discharge status: Home / Self Care | Payer: Medicare Other | Source: Ambulatory Visit | Attending: Family Medicine | Admitting: Family Medicine

## 2012-02-18 ENCOUNTER — Telehealth (INDEPENDENT_AMBULATORY_CARE_PROVIDER_SITE_OTHER): Payer: Self-pay

## 2012-02-18 DIAGNOSIS — R51 Headache: Secondary | ICD-10-CM

## 2012-02-18 NOTE — Telephone Encounter (Signed)
Cardiac Clearance faxed to Dr. Kayren Eaves @ [phone].

## 2012-02-20 ENCOUNTER — Other Ambulatory Visit (HOSPITAL_COMMUNITY): Payer: Self-pay | Admitting: Otolaryngology

## 2012-02-20 ENCOUNTER — Telehealth (INDEPENDENT_AMBULATORY_CARE_PROVIDER_SITE_OTHER): Payer: Self-pay

## 2012-02-20 DIAGNOSIS — J320 Chronic maxillary sinusitis: Secondary | ICD-10-CM

## 2012-02-20 NOTE — Telephone Encounter (Signed)
Pre-Op Medical/Cardiac Clearance faxed to Dr. Rosezetta Schlatter @ [phone].

## 2012-02-23 ENCOUNTER — Ambulatory Visit (HOSPITAL_COMMUNITY)
Admission: RE | Admit: 2012-02-23 | Discharge: 2012-02-23 | Disposition: A | Discharge status: Home / Self Care | Payer: Medicare Other | Source: Ambulatory Visit | Attending: Otolaryngology | Admitting: Otolaryngology

## 2012-02-23 DIAGNOSIS — J320 Chronic maxillary sinusitis: Secondary | ICD-10-CM | POA: Insufficient documentation

## 2012-02-23 DIAGNOSIS — R51 Headache: Secondary | ICD-10-CM | POA: Insufficient documentation

## 2012-02-23 MED ORDER — IOHEXOL 300 MG/ML  SOLN
100.0000 mL | Freq: Once | INTRAMUSCULAR | Status: AC | PRN
  Administered 2012-02-23: 100 mL via INTRAVENOUS

## 2012-03-01 LAB — CBC WITH DIFFERENTIAL/PLATELET
Basophils Relative: 0 % (ref 0–1)
HCT: 44.2 % (ref 39.0–52.0)
Hemoglobin: 15.5 g/dL (ref 13.0–17.0)
Lymphocytes Relative: 20 % (ref 12–46)
MCHC: 35.1 g/dL (ref 30.0–36.0)
Monocytes Absolute: 0.4 10*3/uL (ref 0.1–1.0)
Monocytes Relative: 5 % (ref 3–12)
Neutro Abs: 5.7 10*3/uL (ref 1.7–7.7)
Neutrophils Relative %: 74 % (ref 43–77)
RBC: 5.31 MIL/uL (ref 4.22–5.81)
WBC: 7.7 10*3/uL (ref 4.0–10.5)

## 2012-03-02 LAB — COMPREHENSIVE METABOLIC PANEL
AST: 15 U/L (ref 0–37)
Albumin: 4.5 g/dL (ref 3.5–5.2)
Alkaline Phosphatase: 57 U/L (ref 39–117)
Calcium: 9.2 mg/dL (ref 8.4–10.5)
Chloride: 101 mEq/L (ref 96–112)
Glucose, Bld: 119 mg/dL — ABNORMAL HIGH (ref 70–99)
Potassium: 4.4 mEq/L (ref 3.5–5.3)
Sodium: 139 mEq/L (ref 135–145)
Total Protein: 7.1 g/dL (ref 6.0–8.3)

## 2012-03-02 LAB — LIPID PANEL
HDL: 42 mg/dL (ref >39)
LDL Cholesterol: 174 mg/dL — ABNORMAL HIGH (ref 0–99)
Total CHOL/HDL Ratio: 6.2 Ratio

## 2012-03-02 LAB — HEMOGLOBIN A1C
Hgb A1c MFr Bld: 6.5 % — ABNORMAL HIGH (ref <5.7)
Mean Plasma Glucose: 140 mg/dL — ABNORMAL HIGH (ref <117)

## 2012-03-06 ENCOUNTER — Encounter: Payer: Medicare Other | Admitting: *Deleted

## 2012-03-09 ENCOUNTER — Other Ambulatory Visit (HOSPITAL_COMMUNITY): Payer: Self-pay | Admitting: Otolaryngology

## 2012-03-12 ENCOUNTER — Encounter (HOSPITAL_COMMUNITY): Payer: Self-pay | Admitting: Respiratory Therapy

## 2012-03-18 ENCOUNTER — Encounter (HOSPITAL_COMMUNITY)
Admission: RE | Admit: 2012-03-18 | Discharge: 2012-03-18 | Disposition: A | Discharge status: Home / Self Care | Payer: Medicare Other | Source: Ambulatory Visit | Attending: Otolaryngology | Admitting: Otolaryngology

## 2012-03-18 ENCOUNTER — Encounter (HOSPITAL_COMMUNITY): Payer: Self-pay

## 2012-03-18 ENCOUNTER — Encounter (HOSPITAL_COMMUNITY)
Admission: RE | Admit: 2012-03-18 | Discharge: 2012-03-18 | Disposition: A | Discharge status: Home / Self Care | Payer: Medicare Other | Source: Ambulatory Visit | Attending: Anesthesiology | Admitting: Anesthesiology

## 2012-03-18 HISTORY — DX: Personal history of other medical treatment: Z92.89

## 2012-03-18 HISTORY — DX: Gastrointestinal hemorrhage, unspecified: K92.2

## 2012-03-18 HISTORY — DX: Unspecified osteoarthritis, unspecified site: M19.90

## 2012-03-18 HISTORY — DX: Dorsalgia, unspecified: M54.9

## 2012-03-18 HISTORY — DX: Other chronic pain: G89.29

## 2012-03-18 HISTORY — DX: Personal history of other diseases of the digestive system: Z87.19

## 2012-03-18 HISTORY — DX: Gastro-esophageal reflux disease without esophagitis: K21.9

## 2012-03-18 HISTORY — DX: Personal history of peptic ulcer disease: Z87.11

## 2012-03-18 LAB — BASIC METABOLIC PANEL
GFR calc non Af Amer: 89 mL/min — ABNORMAL LOW (ref >90)
Glucose, Bld: 164 mg/dL — ABNORMAL HIGH (ref 70–99)
Potassium: 3.7 mEq/L (ref 3.5–5.1)
Sodium: 139 mEq/L (ref 135–145)

## 2012-03-18 LAB — CBC
Hemoglobin: 13.7 g/dL (ref 13.0–17.0)
MCH: 29.5 pg (ref 26.0–34.0)
RBC: 4.65 MIL/uL (ref 4.22–5.81)
WBC: 8.3 10*3/uL (ref 4.0–10.5)

## 2012-03-18 LAB — SURGICAL PCR SCREEN
MRSA, PCR: NEGATIVE
Staphylococcus aureus: NEGATIVE

## 2012-03-18 NOTE — Progress Notes (Signed)
Pt doesn't have a cardiologist  Stress test done 20+yrs ago Echo report in epic from 200 Denies ever having a heart cath  Dr.Brent Rosezetta Schlatter is Medical MD  Denies ekg or cxr being done within the past yr

## 2012-03-18 NOTE — Pre-Procedure Instructions (Signed)
20 [name]  03/18/2012   Your procedure is scheduled on:  Fri, Dec 27 @ 7:30 AM  Report to [facility] at 5:30 AM.  Call this number if you have problems the morning of surgery: [phone]   Remember:   Do not eat food:After Midnight.  Take these medicines the morning of surgery with A SIP OF WATER: Abilify(Aripiprazole),Doxazosin(Cardura),Cymbalta(Duloxetine),Pain Pill(if needed),Quinine(Qualaquin),and Protonix(Pantoprazole)   Do not wear jewelry  Do not wear lotions, powders, or colognes. You may wear deodorant.  Men may shave face and neck.  Do not bring valuables to the hospital.  Contacts, dentures or bridgework may not be worn into surgery.  Leave suitcase in the car. After surgery it may be brought to your room.  For patients admitted to the hospital, checkout time is 11:00 AM the day of discharge.   Patients discharged the day of surgery will not be allowed to drive home.    Special Instructions: Shower using CHG 2 nights before surgery and the night before surgery.  If you shower the day of surgery use CHG.  Use special wash - you have one bottle of CHG for all showers.  You should use approximately 1/3 of the bottle for each shower.   Please read over the following fact sheets that you were given: Pain Booklet, Coughing and Deep Breathing, MRSA Information and Surgical Site Infection Prevention

## 2012-03-18 NOTE — Progress Notes (Signed)
03/18/12 1412  OBSTRUCTIVE SLEEP APNEA  Have you ever been diagnosed with sleep apnea through a sleep study? No  Do you snore loudly (loud enough to be heard through closed doors)?  1  Do you often feel tired, fatigued, or sleepy during the daytime? 1  Has anyone observed you stop breathing during your sleep? 0  Do you have, or are you being treated for high blood pressure? 1  BMI more than 35 kg/m2? 1  Age over 60 years old? 1  Neck circumference greater than 40 cm/18 inches? 1 (19  1/2)  Gender: 1  Obstructive Sleep Apnea Score 7   Score 4 or greater  Results sent to PCP

## 2012-03-26 ENCOUNTER — Encounter (HOSPITAL_COMMUNITY)
Admission: RE | Disposition: A | Discharge status: Home / Self Care | Payer: Self-pay | Source: Ambulatory Visit | Attending: Otolaryngology

## 2012-03-26 ENCOUNTER — Encounter (HOSPITAL_COMMUNITY): Payer: Self-pay | Admitting: *Deleted

## 2012-03-26 ENCOUNTER — Encounter (HOSPITAL_COMMUNITY): Payer: Self-pay | Admitting: Anesthesiology

## 2012-03-26 ENCOUNTER — Encounter (HOSPITAL_COMMUNITY): Payer: Self-pay | Admitting: General Practice

## 2012-03-26 ENCOUNTER — Ambulatory Visit (HOSPITAL_COMMUNITY)
Admission: RE | Admit: 2012-03-26 | Discharge: 2012-03-27 | Disposition: A | Discharge status: Home / Self Care | Payer: Medicare Other | Source: Ambulatory Visit | Attending: Otolaryngology | Admitting: Otolaryngology

## 2012-03-26 ENCOUNTER — Ambulatory Visit (HOSPITAL_COMMUNITY): Payer: Medicare Other | Admitting: Anesthesiology

## 2012-03-26 DIAGNOSIS — Q019 Encephalocele, unspecified: Secondary | ICD-10-CM | POA: Insufficient documentation

## 2012-03-26 DIAGNOSIS — Z23 Encounter for immunization: Secondary | ICD-10-CM | POA: Insufficient documentation

## 2012-03-26 DIAGNOSIS — Z01810 Encounter for preprocedural cardiovascular examination: Secondary | ICD-10-CM | POA: Insufficient documentation

## 2012-03-26 DIAGNOSIS — I10 Essential (primary) hypertension: Secondary | ICD-10-CM | POA: Insufficient documentation

## 2012-03-26 DIAGNOSIS — Z01812 Encounter for preprocedural laboratory examination: Secondary | ICD-10-CM | POA: Insufficient documentation

## 2012-03-26 DIAGNOSIS — J323 Chronic sphenoidal sinusitis: Secondary | ICD-10-CM | POA: Insufficient documentation

## 2012-03-26 DIAGNOSIS — Z01818 Encounter for other preprocedural examination: Secondary | ICD-10-CM | POA: Insufficient documentation

## 2012-03-26 HISTORY — PX: SINUS ENDO W/FUSION: SHX777

## 2012-03-26 HISTORY — PX: NASAL SINUS SURGERY: SHX719

## 2012-03-26 HISTORY — PX: ABDOMINAL FAT GRAPH: SHX5709

## 2012-03-26 SURGERY — SINUS SURGERY, ENDOSCOPIC, USING COMPUTER-ASSISTED NAVIGATION
Anesthesia: General | Site: Nose | Wound class: Clean Contaminated

## 2012-03-26 MED ORDER — OXYCODONE HCL 5 MG/5ML PO SOLN: 5.0000 mg | Freq: Once | ORAL | Status: AC | PRN

## 2012-03-26 MED ORDER — PROMETHAZINE HCL 25 MG/ML IJ SOLN: 6.2500 mg | INTRAMUSCULAR | Status: DC | PRN

## 2012-03-26 MED ORDER — LIDOCAINE-EPINEPHRINE 1 %-1:100000 IJ SOLN
INTRAMUSCULAR | Status: DC | PRN
  Administered 2012-03-26: 20 mL

## 2012-03-26 MED ORDER — OXYMETAZOLINE HCL 0.05 % NA SOLN
NASAL | Status: AC
  Filled 2012-03-26: qty 15

## 2012-03-26 MED ORDER — HYDROMORPHONE HCL PF 1 MG/ML IJ SOLN
INTRAMUSCULAR | Status: AC
  Filled 2012-03-26: qty 1

## 2012-03-26 MED ORDER — CLINDAMYCIN HCL 150 MG PO CAPS
150.0000 mg | ORAL_CAPSULE | Freq: Four times a day (QID) | ORAL | Status: DC
  Administered 2012-03-26 – 2012-03-27 (×3): 150 mg via ORAL
  Filled 2012-03-26 (×5): qty 1

## 2012-03-26 MED ORDER — OXYCODONE HCL 5 MG PO TABS
5.0000 mg | ORAL_TABLET | ORAL | Status: DC | PRN
  Administered 2012-03-26 – 2012-03-27 (×5): 5 mg via ORAL
  Filled 2012-03-26 (×5): qty 1

## 2012-03-26 MED ORDER — FENTANYL CITRATE 0.05 MG/ML IJ SOLN: 50.0000 ug | Freq: Once | INTRAMUSCULAR | Status: DC

## 2012-03-26 MED ORDER — STERILE WATER FOR IRRIGATION IR SOLN
Status: DC | PRN
  Administered 2012-03-26 (×2): 1

## 2012-03-26 MED ORDER — PHENYLEPHRINE HCL 10 MG/ML IJ SOLN
INTRAMUSCULAR | Status: DC | PRN
  Administered 2012-03-26 (×2): 40 ug via INTRAVENOUS
  Administered 2012-03-26: 80 ug via INTRAVENOUS

## 2012-03-26 MED ORDER — HYDROMORPHONE HCL PF 1 MG/ML IJ SOLN
0.2500 mg | INTRAMUSCULAR | Status: DC | PRN
  Administered 2012-03-26 (×2): 0.5 mg via INTRAVENOUS

## 2012-03-26 MED ORDER — OXYCODONE-ACETAMINOPHEN 5-325 MG PO TABS
1.0000 | ORAL_TABLET | ORAL | Status: DC | PRN
  Administered 2012-03-26 – 2012-03-27 (×4): 1 via ORAL
  Filled 2012-03-26 (×4): qty 1

## 2012-03-26 MED ORDER — QUININE SULFATE 324 MG PO CAPS
648.0000 mg | ORAL_CAPSULE | Freq: Two times a day (BID) | ORAL | Status: DC
  Administered 2012-03-27: 648 mg via ORAL
  Filled 2012-03-26 (×3): qty 2

## 2012-03-26 MED ORDER — ALBUMIN HUMAN 5 % IV SOLN
INTRAVENOUS | Status: DC | PRN
  Administered 2012-03-26 (×2): via INTRAVENOUS

## 2012-03-26 MED ORDER — LACTATED RINGERS IV SOLN
INTRAVENOUS | Status: DC | PRN
  Administered 2012-03-26 (×4): via INTRAVENOUS

## 2012-03-26 MED ORDER — OXYCODONE-ACETAMINOPHEN 10-325 MG PO TABS: 1.0000 | ORAL_TABLET | ORAL | Status: DC | PRN

## 2012-03-26 MED ORDER — ARIPIPRAZOLE 2 MG PO TABS
2.0000 mg | ORAL_TABLET | Freq: Every day | ORAL | Status: DC
  Administered 2012-03-26 – 2012-03-27 (×2): 2 mg via ORAL
  Filled 2012-03-26 (×2): qty 1

## 2012-03-26 MED ORDER — FLUORESCEIN SODIUM 1 MG OP STRP
ORAL_STRIP | OPHTHALMIC | Status: DC | PRN
  Administered 2012-03-26: 1

## 2012-03-26 MED ORDER — DOXAZOSIN MESYLATE 4 MG PO TABS
4.0000 mg | ORAL_TABLET | Freq: Every day | ORAL | Status: DC
  Filled 2012-03-26 (×2): qty 1

## 2012-03-26 MED ORDER — LIDOCAINE HCL (CARDIAC) 20 MG/ML IV SOLN
INTRAVENOUS | Status: DC | PRN
  Administered 2012-03-26: 70 mg via INTRAVENOUS

## 2012-03-26 MED ORDER — ONDANSETRON HCL 4 MG/2ML IJ SOLN: 4.0000 mg | Freq: Four times a day (QID) | INTRAMUSCULAR | Status: DC | PRN

## 2012-03-26 MED ORDER — MUPIROCIN 2 % EX OINT
TOPICAL_OINTMENT | Freq: Once | CUTANEOUS | Status: DC
  Filled 2012-03-26: qty 22

## 2012-03-26 MED ORDER — OXYCODONE HCL 5 MG PO TABS
5.0000 mg | ORAL_TABLET | Freq: Once | ORAL | Status: AC | PRN
  Administered 2012-03-26: 5 mg via ORAL

## 2012-03-26 MED ORDER — ACETAMINOPHEN 80 MG PO CHEW
320.0000 mg | CHEWABLE_TABLET | ORAL | Status: DC | PRN
  Filled 2012-03-26: qty 4

## 2012-03-26 MED ORDER — PANTOPRAZOLE SODIUM 40 MG PO TBEC: 40.0000 mg | DELAYED_RELEASE_TABLET | Freq: Every day | ORAL | Status: DC

## 2012-03-26 MED ORDER — SODIUM CHLORIDE 0.9 % IR SOLN
Status: DC | PRN
  Administered 2012-03-26 (×2): 1

## 2012-03-26 MED ORDER — ONDANSETRON HCL 4 MG/2ML IJ SOLN
INTRAMUSCULAR | Status: DC | PRN
  Administered 2012-03-26: 4 mg via INTRAVENOUS

## 2012-03-26 MED ORDER — DEXAMETHASONE SODIUM PHOSPHATE 10 MG/ML IJ SOLN
10.0000 mg | Freq: Once | INTRAMUSCULAR | Status: AC
  Administered 2012-03-26: 10 mg via INTRAVENOUS
  Filled 2012-03-26: qty 1

## 2012-03-26 MED ORDER — KCL IN DEXTROSE-NACL 10-5-0.45 MEQ/L-%-% IV SOLN
INTRAVENOUS | Status: DC
  Administered 2012-03-26: 1000 mL via INTRAVENOUS
  Administered 2012-03-27: 05:00:00 via INTRAVENOUS
  Filled 2012-03-26 (×3): qty 1000

## 2012-03-26 MED ORDER — CLINDAMYCIN PHOSPHATE 600 MG/50ML IV SOLN
INTRAVENOUS | Status: AC
  Filled 2012-03-26: qty 50

## 2012-03-26 MED ORDER — FLUORESCEIN SODIUM 1 MG OP STRP
ORAL_STRIP | OPHTHALMIC | Status: AC
  Filled 2012-03-26: qty 3

## 2012-03-26 MED ORDER — NEOSTIGMINE METHYLSULFATE 1 MG/ML IJ SOLN
INTRAMUSCULAR | Status: DC | PRN
  Administered 2012-03-26: 4 mg via INTRAVENOUS

## 2012-03-26 MED ORDER — DULOXETINE HCL 60 MG PO CPEP
60.0000 mg | ORAL_CAPSULE | Freq: Two times a day (BID) | ORAL | Status: DC
  Administered 2012-03-26 – 2012-03-27 (×2): 60 mg via ORAL
  Filled 2012-03-26 (×3): qty 1

## 2012-03-26 MED ORDER — PHENOL 1.4 % MT LIQD
2.0000 | Freq: Three times a day (TID) | OROMUCOSAL | Status: DC | PRN
  Administered 2012-03-26: 2 via OROMUCOSAL
  Filled 2012-03-26: qty 177

## 2012-03-26 MED ORDER — FENTANYL CITRATE 0.05 MG/ML IJ SOLN
INTRAMUSCULAR | Status: DC | PRN
  Administered 2012-03-26: 100 ug via INTRAVENOUS
  Administered 2012-03-26 (×2): 50 ug via INTRAVENOUS
  Administered 2012-03-26: 100 ug via INTRAVENOUS
  Administered 2012-03-26: 50 ug via INTRAVENOUS

## 2012-03-26 MED ORDER — DICLOFENAC SODIUM 1 % TD GEL
2.0000 g | Freq: Four times a day (QID) | TRANSDERMAL | Status: DC
  Administered 2012-03-27: 2 g via TOPICAL
  Filled 2012-03-26: qty 100

## 2012-03-26 MED ORDER — LIDOCAINE-EPINEPHRINE 1 %-1:100000 IJ SOLN
INTRAMUSCULAR | Status: AC
  Filled 2012-03-26: qty 1

## 2012-03-26 MED ORDER — MIDAZOLAM HCL 2 MG/2ML IJ SOLN: 1.0000 mg | INTRAMUSCULAR | Status: DC | PRN

## 2012-03-26 MED ORDER — GLYCOPYRROLATE 0.2 MG/ML IJ SOLN
INTRAMUSCULAR | Status: DC | PRN
  Administered 2012-03-26: 0.6 mg via INTRAVENOUS

## 2012-03-26 MED ORDER — CLINDAMYCIN PHOSPHATE 600 MG/50ML IV SOLN
600.0000 mg | Freq: Once | INTRAVENOUS | Status: AC
  Administered 2012-03-26: 600 mg via INTRAVENOUS
  Filled 2012-03-26: qty 50

## 2012-03-26 MED ORDER — LABETALOL HCL 5 MG/ML IV SOLN
INTRAVENOUS | Status: DC | PRN
  Administered 2012-03-26 (×2): 2.5 mg via INTRAVENOUS
  Administered 2012-03-26: 5 mg via INTRAVENOUS

## 2012-03-26 MED ORDER — HEMOSTATIC AGENTS (NO CHARGE) OPTIME
TOPICAL | Status: DC | PRN
  Administered 2012-03-26: 1 via TOPICAL

## 2012-03-26 MED ORDER — PROPOFOL 10 MG/ML IV BOLUS
INTRAVENOUS | Status: DC | PRN
  Administered 2012-03-26: 250 mg via INTRAVENOUS
  Administered 2012-03-26: 50 mg via INTRAVENOUS

## 2012-03-26 MED ORDER — SUCCINYLCHOLINE CHLORIDE 20 MG/ML IJ SOLN
INTRAMUSCULAR | Status: DC | PRN
  Administered 2012-03-26: 160 mg via INTRAVENOUS

## 2012-03-26 MED ORDER — MIDAZOLAM HCL 5 MG/5ML IJ SOLN
INTRAMUSCULAR | Status: DC | PRN
  Administered 2012-03-26: 1 mg via INTRAVENOUS

## 2012-03-26 MED ORDER — METAXALONE 800 MG PO TABS
800.0000 mg | ORAL_TABLET | Freq: Three times a day (TID) | ORAL | Status: DC | PRN
  Filled 2012-03-26: qty 1

## 2012-03-26 MED ORDER — OXYCODONE HCL 5 MG PO TABS
ORAL_TABLET | ORAL | Status: AC
  Filled 2012-03-26: qty 1

## 2012-03-26 MED ORDER — OXYMETAZOLINE HCL 0.05 % NA SOLN
NASAL | Status: DC | PRN
  Administered 2012-03-26: 1 via NASAL

## 2012-03-26 MED ORDER — ONDANSETRON HCL 4 MG PO TABS: 4.0000 mg | ORAL_TABLET | Freq: Four times a day (QID) | ORAL | Status: DC | PRN

## 2012-03-26 MED ORDER — ARTIFICIAL TEARS OP OINT
TOPICAL_OINTMENT | OPHTHALMIC | Status: DC | PRN
  Administered 2012-03-26: 1 via OPHTHALMIC

## 2012-03-26 MED ORDER — SODIUM CHLORIDE 0.9 % IR SOLN
Status: DC | PRN
  Administered 2012-03-26: 1

## 2012-03-26 MED ORDER — ROCURONIUM BROMIDE 100 MG/10ML IV SOLN
INTRAVENOUS | Status: DC | PRN
  Administered 2012-03-26: 10 mg via INTRAVENOUS
  Administered 2012-03-26: 50 mg via INTRAVENOUS
  Administered 2012-03-26: 20 mg via INTRAVENOUS
  Administered 2012-03-26 (×2): 10 mg via INTRAVENOUS

## 2012-03-26 MED ORDER — MORPHINE SULFATE 2 MG/ML IJ SOLN
2.0000 mg | INTRAMUSCULAR | Status: DC | PRN
  Administered 2012-03-26 – 2012-03-27 (×4): 2 mg via INTRAVENOUS
  Filled 2012-03-26 (×4): qty 1

## 2012-03-26 SURGICAL SUPPLY — 78 items
ALLODERM TISSUE 4X16CM THICK (Tissue) ×3 IMPLANT
APPLIER CLIP 9.375 MED OPEN (MISCELLANEOUS) ×3
ATTRACTOMAT 16X20 MAGNETIC DRP (DRAPES) IMPLANT
BENZOIN TINCTURE PRP APPL 2/3 (GAUZE/BANDAGES/DRESSINGS) ×3 IMPLANT
BLADE IRRIGATING MICRODEBRIDER (BLADE) IMPLANT
BLADE RAD 40 CVD SINUS 4MM (BLADE) IMPLANT
BLADE RAD60 ROTATE M4 4 5PK (BLADE) IMPLANT
BLADE ROTATE RAD 40 4 M4 (BLADE) IMPLANT
BLADE ROTATE TRICUT 4X13 M4 (BLADE) ×3 IMPLANT
BLADE SURG ROTATE 9660 (MISCELLANEOUS) ×3 IMPLANT
BUR DIAMOND CURV 15X5 15D (BURR) ×3 IMPLANT
CANISTER SUCTION 2500CC (MISCELLANEOUS) ×3 IMPLANT
CHLORAPREP W/TINT 26ML (MISCELLANEOUS) ×3 IMPLANT
CLIP APPLIE 9.375 MED OPEN (MISCELLANEOUS) ×2 IMPLANT
CLOTH BEACON ORANGE TIMEOUT ST (SAFETY) ×3 IMPLANT
COAGULATOR SUCT SWTCH 10FR 6 (ELECTROSURGICAL) ×3 IMPLANT
CONT SPEC 4OZ CLIKSEAL STRL BL (MISCELLANEOUS) IMPLANT
CORDS BIPOLAR (ELECTRODE) ×3 IMPLANT
CRADLE DONUT ADULT HEAD (MISCELLANEOUS) ×3 IMPLANT
DECANTER SPIKE VIAL GLASS SM (MISCELLANEOUS) ×3 IMPLANT
DERMABOND ADVANCED (GAUZE/BANDAGES/DRESSINGS) ×1
DERMABOND ADVANCED .7 DNX12 (GAUZE/BANDAGES/DRESSINGS) ×2 IMPLANT
DRESSING NASAL POPE 10X1.5X2.5 (GAUZE/BANDAGES/DRESSINGS) ×2 IMPLANT
DRESSING TELFA 8X3 (GAUZE/BANDAGES/DRESSINGS) IMPLANT
DRSG NASAL POPE 10X1.5X2.5 (GAUZE/BANDAGES/DRESSINGS) ×3
DRSG NASOPORE 8CM (GAUZE/BANDAGES/DRESSINGS) ×3 IMPLANT
DURASEAL SPINE SEALANT 3ML (MISCELLANEOUS) ×3 IMPLANT
ELECT BLADE 6.5 EXT (BLADE) ×3 IMPLANT
ELECT COATED BLADE 2.86 ST (ELECTRODE) ×3 IMPLANT
ELECT REM PT RETURN 9FT ADLT (ELECTROSURGICAL) ×3
ELECTRODE REM PT RTRN 9FT ADLT (ELECTROSURGICAL) ×2 IMPLANT
FILTER ARTHROSCOPY CONVERTOR (FILTER) ×3 IMPLANT
FLOSEAL 10ML (HEMOSTASIS) IMPLANT
GLOVE BIO SURGEON STRL SZ 6 (GLOVE) ×3 IMPLANT
GLOVE BIO SURGEON STRL SZ7.5 (GLOVE) ×6 IMPLANT
GLOVE BIOGEL PI IND STRL 6 (GLOVE) ×4 IMPLANT
GLOVE BIOGEL PI INDICATOR 6 (GLOVE) ×2
GLOVE SURG SS PI 6.0 STRL IVOR (GLOVE) ×12 IMPLANT
GLOVE SURG SS PI 6.5 STRL IVOR (GLOVE) ×9 IMPLANT
GLOVE SURG SS PI 7.5 STRL IVOR (GLOVE) ×6 IMPLANT
GOWN STRL NON-REIN LRG LVL3 (GOWN DISPOSABLE) ×12 IMPLANT
GOWN STRL REIN XL XLG (GOWN DISPOSABLE) ×6 IMPLANT
HEMOSTAT SURGICEL 2X14 (HEMOSTASIS) ×3 IMPLANT
KIT BASIN OR (CUSTOM PROCEDURE TRAY) ×3 IMPLANT
KIT ROOM TURNOVER OR (KITS) ×3 IMPLANT
NEEDLE SPNL 20GX3.5 QUINCKE YW (NEEDLE) ×3 IMPLANT
NS IRRIG 1000ML POUR BTL (IV SOLUTION) ×6 IMPLANT
PAD ARMBOARD 7.5X6 YLW CONV (MISCELLANEOUS) ×6 IMPLANT
PAD ENT ADHESIVE 25PK (MISCELLANEOUS) ×3 IMPLANT
PATTIES SURGICAL .5 X3 (DISPOSABLE) ×3 IMPLANT
PENCIL BUTTON HOLSTER BLD 10FT (ELECTRODE) ×3 IMPLANT
PENCIL FOOT CONTROL (ELECTRODE) ×3 IMPLANT
SHEATH ENDOSCRUB 0 DEG (SHEATH) ×3 IMPLANT
SHEATH ENDOSCRUB 30 DEG (SHEATH) IMPLANT
SHEATH ENDOSCRUB 45 DEG (SHEATH) ×3 IMPLANT
SOLUTION ANTI FOG 6CC (MISCELLANEOUS) ×3 IMPLANT
SPECIMEN JAR SMALL (MISCELLANEOUS) IMPLANT
SPLINT NASAL DOYLE BI-VL (GAUZE/BANDAGES/DRESSINGS) ×3 IMPLANT
STRIP CLOSURE SKIN 1/2X4 (GAUZE/BANDAGES/DRESSINGS) ×3 IMPLANT
SUT CHROMIC 4 0 PS 2 18 (SUTURE) ×3 IMPLANT
SUT ETHILON 3 0 PS 1 (SUTURE) ×3 IMPLANT
SUT PLAIN 4 0 ~~LOC~~ 1 (SUTURE) ×3 IMPLANT
SUT SILK 2 0 FS (SUTURE) ×3 IMPLANT
SUT VIC AB 3-0 SH 27 (SUTURE) ×1
SUT VIC AB 3-0 SH 27X BRD (SUTURE) ×2 IMPLANT
SWAB COLLECTION DEVICE MRSA (MISCELLANEOUS) IMPLANT
SYR 50ML SLIP (SYRINGE) IMPLANT
SYRINGE 10CC LL (SYRINGE) ×3 IMPLANT
TOWEL OR 17X24 6PK STRL BLUE (TOWEL DISPOSABLE) ×3 IMPLANT
TOWEL OR 17X26 10 PK STRL BLUE (TOWEL DISPOSABLE) ×3 IMPLANT
TRACKER ENT INSTRUMENT (MISCELLANEOUS) ×3 IMPLANT
TRACKER ENT PATIENT (MISCELLANEOUS) ×3 IMPLANT
TRAY ENT MC OR (CUSTOM PROCEDURE TRAY) ×3 IMPLANT
TRAY FOLEY CATH 14FR (SET/KITS/TRAYS/PACK) ×3 IMPLANT
TUBE CONNECTING 12X1/4 (SUCTIONS) ×3 IMPLANT
TUBING STRAIGHTSHOT EPS 5PK (TUBING) ×3 IMPLANT
WATER STERILE IRR 1000ML POUR (IV SOLUTION) ×3 IMPLANT
WIPE INSTRUMENT VISIWIPE 73X73 (MISCELLANEOUS) ×3 IMPLANT

## 2012-03-26 NOTE — Transfer of Care (Signed)
Immediate Anesthesia Transfer of Care Note  Patient: Randy Grimes  Procedure(s) Performed: Procedure(s) (LRB) with comments: ENDOSCOPIC SINUS SURGERY WITH FUSION NAVIGATION (N/A) - LEFT ETHMOIDECTOMY; BILATERAL SPHENOIDECTOMY; LEFT MAXILLARY ANTROSTOMY WITH IMAGE GUIDANCE; REPAIR LEFT SPHENOID CSF LEAK; NASAL SEPTAL FLAP ABDOMINAL FAT GRAPH (N/A)  Patient Location: PACU  Anesthesia Type:General  Level of Consciousness: awake and alert   Airway & Oxygen Therapy: Patient Spontanous Breathing and Patient connected to face mask oxygen  Post-op Assessment: Report given to PACU RN, Post -op Vital signs reviewed and stable and Patient moving all extremities X 4  Post vital signs: Reviewed and stable  Complications: No apparent anesthesia complications

## 2012-03-26 NOTE — Anesthesia Postprocedure Evaluation (Signed)
  Anesthesia Post-op Note  Patient: Randy Grimes  Procedure(s) Performed: Procedure(s) (LRB) with comments: ENDOSCOPIC SINUS SURGERY WITH FUSION NAVIGATION (N/A) - LEFT ETHMOIDECTOMY; BILATERAL SPHENOIDECTOMY; LEFT MAXILLARY ANTROSTOMY WITH IMAGE GUIDANCE; REPAIR LEFT SPHENOID CSF LEAK; NASAL SEPTAL FLAP ABDOMINAL FAT GRAPH (N/A)  Patient Location: PACU  Anesthesia Type:General  Level of Consciousness: awake  Airway and Oxygen Therapy: Patient Spontanous Breathing  Post-op Pain: mild  Post-op Assessment: Post-op Vital signs reviewed, Patient's Cardiovascular Status Stable, Respiratory Function Stable, Patent Airway, No signs of Nausea or vomiting and Pain level controlled  Post-op Vital Signs: stable  Complications: No apparent anesthesia complications

## 2012-03-26 NOTE — Anesthesia Preprocedure Evaluation (Addendum)
Anesthesia Evaluation  Patient identified by MRN, date of birth, ID band Patient awake    Reviewed: Allergy & Precautions, H&P , NPO status , Patient's Chart, lab work & pertinent test results, reviewed documented beta blocker date and time   Airway Mallampati: II TM Distance: >3 FB Neck ROM: Full    Dental  (+) Teeth Intact and Dental Advisory Given   Pulmonary  breath sounds clear to auscultation        Cardiovascular hypertension, Pt. on medications Rhythm:Regular Rate:Normal     Neuro/Psych  Headaches, Depression    GI/Hepatic GERD-  Medicated and Controlled,  Endo/Other  Morbid obesity  Renal/GU      Musculoskeletal   Abdominal (+) + obese,   Peds  Hematology   Anesthesia Other Findings   Reproductive/Obstetrics                         Anesthesia Physical Anesthesia Plan  ASA: III  Anesthesia Plan: General   Post-op Pain Management:    Induction: Intravenous  Airway Management Planned: Oral ETT  Additional Equipment:   Intra-op Plan:   Post-operative Plan: Extubation in OR  Informed Consent: I have reviewed the patients History and Physical, chart, labs and discussed the procedure including the risks, benefits and alternatives for the proposed anesthesia with the patient or authorized representative who has indicated his/her understanding and acceptance.     Plan Discussed with: CRNA and Surgeon  Anesthesia Plan Comments:         Anesthesia Quick Evaluation

## 2012-03-26 NOTE — Op Note (Signed)
03/26/2012 1:25 PM Surgeon: Melvenia Beam Procedure Performed: 15732-left-left nasoseptal flap 62121-left- repair of left lateral sphenoid skull base encephalocele 20926-Tissue grafts, other-abdominal fat graft to left sphenoid 31267-left-left maxillary antrostomy with tissue removal 31255-left-left total ethmoidectomy 31288-50-bilateral sphenoidotomies with tissue removal    PREOPERATIVE DIAGNOSIS: 1. Bilateral chronic sphenoid sinusitis 2. 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  POSTOPERATIVE DIAGNOSIS: 1. Bilateral chronic sphenoid sinusitis 2. 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   ANESTHESIA: General endotracheal.  ESTIMATED BLOOD LOSS: Approximately 300 cc.  SPECIMENS: sinus contents and left sphenoid encephalocele.  HISTORY OF PRESENT ILLNESS: The patient is a  61yo who has had chronic sphenoid sinusitis, headaches, and an enlarging left sphenoid Sternberg's canal encephalocele.  PROCEDURE: The patient was brought to the operating room and placed in the supine position. After adequate endotracheal anesthesia was obtained, the abdominal skin was prepped and draped in sterile fashion. Lidocaine 1% with 1:100,000 epinephrine was injected into the abdominal fat graft incision and also into the bilateral greater palatine arteries in the standard fashion. The abdominal fat graft was obtained by making a horizontal incision caudal to the umbilicus (the patient has an umbilical hernia) with the 15 blade, and then using the Bovie and Allis to remove a generous fat graft. The incision was closed with deep 4-0 vicryl and dermabond for the skin.  The image guidance hardware was placed on the patient's forehead and he was registered to the image guidance Ct with good accuracy and precision.   Attention then was directed toward the left side. Lidocaine 1% with 1:100,000 epinephrine was injected in the region of the anterior  portion of the left middle turbinate and uncinate process and polyps. The left middle turbinate was removed and placed in saline after the mucosa was removed. The left uncinate process was removed systematically superiorly to inferiorly with back-biting forceps and the left maxillary antrostomy was identified and expanded with the back-biting forceps and microdebrider, taking care to include the left maxillary natural ostium.  The anterior and posterior ethmoid air cells were entered primarily using the J-curette and dissected with the microdebrider and up-biting forceps for the superior and lateral dissection. The ethmoid were opened up to the skull base and out to the lamina papyracea.  The left sphenoid natural ostium was then entered using the image guidance suction and the 3mm Kerrison was used to widely open the left sphenoid up to the skull base and out to the lamina. The right sphenoid was then identified using a transnasal approach and the right sphenoid was widely opened using the 3mm Kerrison.  I then came back to the left sphenoid. I identified the left lateral sphenoid encephalocele and decompressed this with the suction. I rush of ~78mL of clear CSF was suctioned out. Next I used the 3mm Kerrison and the 5mm diamond drill to remove the medial posterior wall of the left maxillary sinus and to decompress the bone medial to the left pterygopalatine fossa and the left posterior lamina papyracea. I took care not to injure the pterygopalatine fossa contents or V2 or the Vidian or the orbital contents. I cauterized some bleeding from a branch of the left internal maxillary artery. I then used the guarded bipolar to cauterize the base of the encephalocele and to resect and remove the encephalocele in the left lateral sphenoid. Once I had removed the bone around the left sphenoid encephalocele I then cut an alloderm graft to fit and placed an alloderm underlay graft across the posterior  maxillary wall,  pterygopalatine fossa, and across the left lateral sphenoid/medial left temporal dura, covering the left sphenoid defect. I then placed a generous fat graft in the sinus to further bolster the graft, after I removed all of the mucosal tissue from the left sphenoid sinus.  A #15 blade and the Cottle elevator were then used to make a standard left hemitransfixion incision and a mucoperichondrial/mucoperiosteal flap was carefully elevated on the left side of the septum. I then completed harvesting the left nasoseptal flap using the straight Mayo scissors for the inferior and superior cuts. I then used the straight Blakesley to rotate the left nasoseptal flap, with the periosteal side down/facing the fat graft and the mucosal side facing out, and placed this over the encephalocele/Alloderm graft/fat graft. I then bolstered the repair with surgicel and Duraseal, and then ascospore and a 10cm merocele that was secured to the patient's left cheek.   I placed the left middle turbinate graft on the left septum, sewed it on with 4-0 plain gut, and then placed bilateral Doyle splints.  Any obvious bleeding points were controlled with the suction Bovie apparatus. A thorough irrigation was then carried out in the nasal cavity, and the stomach was suctioned out. The image guidance hardware was removed and the patient was returned to the care of anesthesia, awakened, and extubated. The patient tolerated the procedure well and returned to the recovery room in stable condition.   Dr. Melvenia Beam was present and performed the entire procedure. 03/26/2012 1:20 PM Melvenia Beam

## 2012-03-26 NOTE — Progress Notes (Signed)
Subjective: POD#0 from left sinus surgery and endoscopic repair of left sphenoid Sternberg's canal encephalocele with left nasoseptal flap/fat graft/alloderm. Doing well post-op, pain well controlled.  Objective: Vital signs in last 24 hours: Temp:  [98.1 F (36.7 C)-98.3 F (36.8 C)] 98.3 F (36.8 C) (12/27 1257) Pulse Rate:  [61-72] 62  (12/27 1355) Resp:  [13-23] 16  (12/27 1355) BP: (137-149)/(80-98) 140/80 mmHg (12/27 1345) SpO2:  [95 %-100 %] 97 % (12/27 1355)  EOMI, PERLLA, vision intact bilaterally, able to count fingers OU. Left nasal packing and bilateral Doyle splints in place and secure. Oral cavity clear. Awake, alert and oriented. Nose hemostatic.  @LABLAST2 (wbc:2,hgb:2,hct:2,plt:2) No results found for this basename: NA:2,K:2,CL:2,CO2:2,GLUCOSE:2,BUN:2,CREATININE:2,CALCIUM:2 in the last 72 hours  Medications:  No current facility-administered medications on file prior to encounter.   Current Outpatient Prescriptions on File Prior to Encounter  Medication Sig Dispense Refill  . ARIPiprazole (ABILIFY) 2 MG tablet Take 2 mg by mouth daily.        Marland Kitchen doxazosin (CARDURA) 4 MG tablet Take 4 mg by mouth at bedtime.       . DULoxetine (CYMBALTA) 60 MG capsule Take 60 mg by mouth 2 (two) times daily.        . metaxalone (SKELAXIN) 800 MG tablet Take 800 mg by mouth 3 (three) times daily as needed. For muscle pain      . oxyCODONE-acetaminophen (PERCOCET) 10-325 MG per tablet Take 1 tablet by mouth every 4 (four) hours as needed. For pain      . pantoprazole (PROTONIX) 40 MG tablet Take 40 mg by mouth daily.        . quiNINE (QUALAQUIN) 324 MG capsule Take 648 mg by mouth 2 (two) times daily.         Current facility-administered medications:acetaminophen (TYLENOL) chewable tablet 320 mg, 320 mg, Oral, Q4H PRN, Melvenia Beam, MD;  ARIPiprazole (ABILIFY) tablet 2 mg, 2 mg, Oral, Daily, Melvenia Beam, MD;  clindamycin (CLEOCIN) capsule 150 mg, 150 mg, Oral, Q6H, Melvenia Beam, MD;   dextrose 5 % and 0.45 % NaCl with KCl 10 mEq/L infusion, , Intravenous, Continuous, Melvenia Beam, MD diclofenac sodium (VOLTAREN) 1 % transdermal gel 2 g, 2 g, Topical, QID, Melvenia Beam, MD;  doxazosin (CARDURA) tablet 4 mg, 4 mg, Oral, QHS, Melvenia Beam, MD;  DULoxetine (CYMBALTA) DR capsule 60 mg, 60 mg, Oral, BID, Melvenia Beam, MD;  fentaNYL (SUBLIMAZE) injection 50-100 mcg, 50-100 mcg, Intravenous, Once, Bedelia Person, MD;  HYDROmorphone (DILAUDID) 1 MG/ML injection, , , ,  HYDROmorphone (DILAUDID) injection 0.25-0.5 mg, 0.25-0.5 mg, Intravenous, Q5 min PRN, Bedelia Person, MD, 0.5 mg at 03/26/12 1356;  metaxalone (SKELAXIN) tablet 800 mg, 800 mg, Oral, TID PRN, Melvenia Beam, MD;  midazolam (VERSED) injection 1-2 mg, 1-2 mg, Intravenous, PRN, Bedelia Person, MD;  morphine 2 MG/ML injection 2 mg, 2 mg, Intravenous, Q3H PRN, Melvenia Beam, MD;  mupirocin ointment (BACTROBAN) 2 %, , Nasal, Once, Melvenia Beam, MD ondansetron [facility]) injection 4 mg, 4 mg, Intravenous, Q6H PRN, Melvenia Beam, MD;  ondansetron [facility]) tablet 4 mg, 4 mg, Oral, Q6H PRN, Melvenia Beam, MD;  oxyCODONE (Oxy IR/ROXICODONE) 5 MG immediate release tablet, , , , ;  oxyCODONE-acetaminophen (PERCOCET) 10-325 MG per tablet 1 tablet, 1 tablet, Oral, Q4H PRN, Melvenia Beam, MD;  pantoprazole (PROTONIX) EC tablet 40 mg, 40 mg, Oral, Daily, Melvenia Beam, MD phenol (CHLORASEPTIC) mouth spray 2 spray, 2 spray, Mouth/Throat, TID PRN, Melvenia Beam, MD;  promethazine (PHENERGAN) injection 6.25-12.5 mg, 6.25-12.5 mg, Intravenous, Q15  min PRN, Bedelia Person, MD;  quiNINE [facility]) capsule 648 mg, 648 mg, Oral, BID, Melvenia Beam, MD  Assessment/Plan: Doing well s/p endoscopic repair of left sphenoid Sternberg's canal encephalocele with a left nasoseptal flap/abdominal fat graft/alloderm graft.doing well, alert and oriented. Will monitor overnight, can likely go home in the morning if doing well. Prescriptions are on chart/sent to pharmacy.   LOS: 0 days     Melvenia Beam 03/26/2012, 2:02 PM

## 2012-03-26 NOTE — Preoperative (Signed)
Beta Blockers   Reason not to administer Beta Blockers:Not Applicable 

## 2012-03-26 NOTE — Anesthesia Procedure Notes (Signed)
Procedure Name: Intubation Date/Time: 03/26/2012 7:52 AM Performed by: Marni Griffon Pre-anesthesia Checklist: Patient identified, Emergency Drugs available, Suction available and Patient being monitored Patient Re-evaluated:Patient Re-evaluated prior to inductionOxygen Delivery Method: Circle system utilized Preoxygenation: Pre-oxygenation with 100% oxygen Intubation Type: IV induction Ventilation: Two handed mask ventilation required Laryngoscope Size: Mac and 4 Grade View: Grade II Tube type: Oral Rae Tube size: 7.5 mm Number of attempts: 1 Airway Equipment and Method: Stylet Placement Confirmation: ETT inserted through vocal cords under direct vision,  breath sounds checked- equal and bilateral and positive ETCO2 Tube secured with: Tape (at flex to cheeks) Dental Injury: Teeth and Oropharynx as per pre-operative assessment

## 2012-03-26 NOTE — H&P (Signed)
03/26/2012 7:12 AM  Randy Grimes  PREOPERATIVE HISTORY AND PHYSICAL  CHIEF COMPLAINT: left sphenoid mass  HISTORY: This is a 61 year old male who presents with headaches and a series of CT scans showing a progressively enlarging left sphenoid mass that has grown over several years. This appears consistent with either an enlarging mucocele or a lateral sphenoid/Sternberg's canal encephalocele.Marland Kitchen  He now presents for endoscopic sinus surgery, removal of the left sphenoid mass, and possible repair of the CSF leak/encephalocele, possible nasoseptal flap, possible abdominal fat graft.  Dr. Emeline Darling, [place] has discussed the risks (CSF leak, bleeding, stroke, eye injury, bleeding, death, dental injury, infection), benefits, and alternatives of this procedure. The patient understands the risks and would like to proceed with the procedure. The chances of success of the procedure are >50% and the patient understands this. I personally performed an examination of the patient within 24 hours of the procedure.  PAST MEDICAL HISTORY: Past Medical History  Diagnosis Date  . Acute kidney failure, unspecified   . Allergic rhinitis, cause unspecified   . Other chronic pain   . Headache   . Unspecified pruritic disorder   . Hordeolum externum     Stye in R lower eyelid  . Obesity, Class III, BMI 40-49.9 (morbid obesity)   . Essential hypertension, benign     takes Cardura "sometimes"  . History of migraine     last one a couple of weeks ago  . Arthritis   . Chronic back pain   . GERD (gastroesophageal reflux disease)     takes Protonix daily  . History of gastric ulcer   . GI bleeding 1989  . History of blood transfusion     no abnormal reaction noted  . History of kidney stones   . Depressive disorder, not elsewhere classified     takes Cymbalta and Abilify daily    PAST SURGICAL HISTORY: Past Surgical History  Procedure Date  . Ankle surgery   . Back surgery   . Hand surgery   . Leg  surgery   . Undescended testicle     as a child  . Left testcle removed   . Vasectomy   . Tonsillectomy     as a child  . Tibia fracture surgery     multiple  . Spinal cord stimulator placed   . Spinal cord stimulator removed     MEDICATIONS: No current facility-administered medications on file prior to encounter.   Current Outpatient Prescriptions on File Prior to Encounter  Medication Sig Dispense Refill  . ARIPiprazole (ABILIFY) 2 MG tablet Take 2 mg by mouth daily.        Marland Kitchen doxazosin (CARDURA) 4 MG tablet Take 4 mg by mouth at bedtime.       . DULoxetine (CYMBALTA) 60 MG capsule Take 60 mg by mouth 2 (two) times daily.        . metaxalone (SKELAXIN) 800 MG tablet Take 800 mg by mouth 3 (three) times daily as needed. For muscle pain      . oxyCODONE-acetaminophen (PERCOCET) 10-325 MG per tablet Take 1 tablet by mouth every 4 (four) hours as needed. For pain      . pantoprazole (PROTONIX) 40 MG tablet Take 40 mg by mouth daily.        . quiNINE (QUALAQUIN) 324 MG capsule Take 648 mg by mouth 2 (two) times daily.          ALLERGIES: No Known Allergies    SOCIAL HISTORY: History  Social History  . Marital Status: Married    Spouse Name: N/A    Number of Children: N/A  . Years of Education: N/A   Occupational History  . Not on file.   Social History Main Topics  . Smoking status: Never Smoker   . Smokeless tobacco: Not on file  . Alcohol Use: No  . Drug Use: No  . Sexually Active: Not on file   Other Topics Concern  . Not on file   Social History Narrative  . No narrative on file    FAMILY HISTORY: Family History  Problem Relation Age of Onset  . Asthma Other   . Depression Other   . Diabetes Other   . Heart disease Other   . Hypertension Other   . Stroke Other     REVIEW OF SYSTEMS:  HEENT: headaches, otherwise negative x 10 systems except per HPI   PHYSICAL EXAM:  GENERAL:  NAD VITAL SIGNS:   Filed Vitals:   03/26/12 0606  BP: 144/98    Pulse: 61  Temp: 98.1 F (36.7 C)  Resp: 20   SKIN:  Warm, dry HEENT:  Oral cavity clear, tonsils surgically absent NECK:  supple LYMPH:  No LAD LUNGS:  Grossly clear CARDIOVASCULAR:  RRR ABDOMEN:  Soft, NT MUSCULOSKELETAL: normal strength PSYCH:  Normal affect NEUROLOGIC:  CN 2-12 grossly intact and symmetric  DIAGNOSTIC STUDIES: CT scan with contrast reviewed, shows left lateral sphenoid mass filling entire left sphenoid, possible bony dehiscence lateral to V2.  ASSESSMENT AND PLAN: Plan to proceed with endoscopic sinus surgery, removal of left sphenoid mass, possible abdominal fat graft, possible repair of CSF leak, possible nasoseptal flap. Patient understands the risks, benefits, and alternatives.  03/26/2012 7:12 AM Randy Grimes

## 2012-03-27 MED ORDER — MENINGOCOCCAL A C Y&W-135 CONJ IM INJ
0.5000 mL | INJECTION | Freq: Once | INTRAMUSCULAR | Status: DC
  Filled 2012-03-27: qty 0.5

## 2012-03-27 MED ORDER — MENINGOCOCCAL VAC A,C,Y,W-135 ~~LOC~~ INJ
0.5000 mL | INJECTION | Freq: Once | SUBCUTANEOUS | Status: AC
  Administered 2012-03-27: 0.5 mL via SUBCUTANEOUS
  Filled 2012-03-27: qty 0.5

## 2012-03-27 MED ORDER — PNEUMOCOCCAL VAC POLYVALENT 25 MCG/0.5ML IJ INJ
0.5000 mL | INJECTION | Freq: Once | INTRAMUSCULAR | Status: AC
  Administered 2012-03-27: 0.5 mL via INTRAMUSCULAR
  Filled 2012-03-27: qty 0.5

## 2012-03-27 NOTE — Discharge Summary (Signed)
Physician Discharge Summary  Patient ID: Randy Grimes MRN: [id number] DOB/AGE: Mar 04, 1951 61 y.o.  Admit date: 03/26/2012 Discharge date: 03/27/2012  Admission Diagnoses:CSF leak  Discharge Diagnoses:  Active Problems:  * No active hospital problems. *    Discharged Condition: good  Hospital Course: no complications  Consults: none  Significant Diagnostic Studies: none  Treatments: surgery: endoscopic repair CSF leak  Discharge Exam: Blood pressure 144/78, pulse 59, temperature 97.5 F (36.4 C), temperature source Oral, resp. rate 18, height 6' (1.829 m), weight 334 lb 14.4 oz (151.91 kg), SpO2 96.00%. PHYSICAL EXAM: Awake and alert, having some intermittent bleeding. No active bleeding now.  Disposition: 01-Home or Self Care  Discharge Orders    Future Orders Please Complete By Expires   Diet - low sodium heart healthy      Increase activity slowly          Medication List     As of 03/27/2012  7:31 AM    TAKE these medications         ARIPiprazole 2 MG tablet   Commonly known as: ABILIFY   Take 2 mg by mouth daily.      diclofenac sodium 1 % Gel   Commonly known as: VOLTAREN   Apply 2 g topically 4 (four) times daily.      doxazosin 4 MG tablet   Commonly known as: CARDURA   Take 4 mg by mouth at bedtime.      DULoxetine 60 MG capsule   Commonly known as: CYMBALTA   Take 60 mg by mouth 2 (two) times daily.      metaxalone 800 MG tablet   Commonly known as: SKELAXIN   Take 800 mg by mouth 3 (three) times daily as needed. For muscle pain      oxyCODONE-acetaminophen 10-325 MG per tablet   Commonly known as: PERCOCET   Take 1 tablet by mouth every 4 (four) hours as needed. For pain      pantoprazole 40 MG tablet   Commonly known as: PROTONIX   Take 40 mg by mouth daily.      QUALAQUIN 324 MG capsule   Generic drug: quiNINE   Take 648 mg by mouth 2 (two) times daily.           Follow-up Information    Follow up with Melvenia Beam,  MD. Schedule an appointment as soon as possible for a visit in 2 weeks.   Contact information:   [address] [place] [address] [phone]          Signed: Serena Colonel 03/27/2012, 7:31 AM

## 2012-03-29 ENCOUNTER — Encounter (HOSPITAL_COMMUNITY): Payer: Self-pay | Admitting: Otolaryngology

## 2012-04-22 DIAGNOSIS — G894 Chronic pain syndrome: Secondary | ICD-10-CM | POA: Insufficient documentation

## 2012-05-26 DIAGNOSIS — M961 Postlaminectomy syndrome, not elsewhere classified: Secondary | ICD-10-CM | POA: Insufficient documentation

## 2012-05-27 ENCOUNTER — Ambulatory Visit (HOSPITAL_COMMUNITY)
Admission: RE | Admit: 2012-05-27 | Discharge: 2012-05-27 | Disposition: A | Discharge status: Home / Self Care | Payer: Medicare Other | Source: Ambulatory Visit | Attending: Surgery | Admitting: Surgery

## 2012-05-27 ENCOUNTER — Encounter (HOSPITAL_COMMUNITY): Payer: Self-pay | Admitting: *Deleted

## 2012-05-27 ENCOUNTER — Encounter (HOSPITAL_COMMUNITY)
Admission: RE | Disposition: A | Discharge status: Home / Self Care | Payer: Self-pay | Source: Ambulatory Visit | Attending: Surgery

## 2012-05-27 DIAGNOSIS — Z6841 Body Mass Index (BMI) 40.0 and over, adult: Secondary | ICD-10-CM | POA: Insufficient documentation

## 2012-05-27 DIAGNOSIS — I10 Essential (primary) hypertension: Secondary | ICD-10-CM | POA: Insufficient documentation

## 2012-05-27 DIAGNOSIS — K314 Gastric diverticulum: Secondary | ICD-10-CM | POA: Insufficient documentation

## 2012-05-27 DIAGNOSIS — Z8711 Personal history of peptic ulcer disease: Secondary | ICD-10-CM

## 2012-05-27 DIAGNOSIS — Z79899 Other long term (current) drug therapy: Secondary | ICD-10-CM | POA: Insufficient documentation

## 2012-05-27 HISTORY — PX: ESOPHAGOGASTRODUODENOSCOPY: SHX5428

## 2012-05-27 SURGERY — EGD (ESOPHAGOGASTRODUODENOSCOPY)
Anesthesia: Moderate Sedation

## 2012-05-27 MED ORDER — FENTANYL CITRATE 0.05 MG/ML IJ SOLN
INTRAMUSCULAR | Status: AC
  Filled 2012-05-27: qty 2

## 2012-05-27 MED ORDER — MIDAZOLAM HCL 10 MG/2ML IJ SOLN
INTRAMUSCULAR | Status: DC | PRN
  Administered 2012-05-27 (×3): 2 mg via INTRAVENOUS

## 2012-05-27 MED ORDER — FENTANYL CITRATE 0.05 MG/ML IJ SOLN
INTRAMUSCULAR | Status: DC | PRN
  Administered 2012-05-27 (×3): 25 ug via INTRAVENOUS

## 2012-05-27 MED ORDER — BUTAMBEN-TETRACAINE-BENZOCAINE 2-2-14 % EX AERO
INHALATION_SPRAY | CUTANEOUS | Status: DC | PRN
  Administered 2012-05-27: 3 via TOPICAL

## 2012-05-27 MED ORDER — MIDAZOLAM HCL 10 MG/2ML IJ SOLN
INTRAMUSCULAR | Status: AC
  Filled 2012-05-27: qty 2
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
## 2012-05-27 NOTE — Op Note (Signed)
05/27/2012  1:05 PM  PATIENT:  Randy Grimes, 62 y.o., male, MRN: [id number]  PREOP DIAGNOSIS:  History of duodenal ulcer, plan sleeve gastrectomy  POSTOP DIAGNOSIS:   No active ulcer.  Question dimple in antrum at site of old ulcer, but no acute inflammation.  Small diverticula in cardia. (The diverticula will be resected with a sleeve gastrectomy.)  PROCEDURE:  Esophagogastroduedonoscopy  SURGEON:   [name], M.D.  ANESTHESIA:   Fentanyl  75 mcg   Versed 6 mg  INDICATIONS FOR PROCEDURE:  Randy Grimes is a 62 y.o. (DOB: 06-07-1950)  white  male whose primary care physician is BURNETT,BRENT A, MD and comes for upper endoscopy to evaluate his stomach/duodenum pre op for a sleeve gastrectomy by Dr. Trude Mcburney.  The patient has a remote history of problems with ulcers, at least one time he had a GI bleed.   The indications and risks of the endoscopy were explained to the patient.  The risks include, but are not limited to, perforation, bleeding, or injury to the bowel.  PROCEDURE:  The patient was monitored with a pulse oximetry, BP cuff, and EKG.  The patient has nasal O2 flowing during the procedure.   The back of the throat was anesthestized with Ceticaine.  A flexible Pentax endoscope was passed down the throat without difficulty.  Findings include:   Esophagus:   Normal   GE junction at:  42 cm   Stomach: Normal.  Except in the cardia of the stomach, he has a small diverticula.  [Photos taken.]  He has a dimple in the antrum that may be an old ulcer scar (it also may be nothing), but there is no ulcer and no acute inflammation.   Duodenum:   Normal  PLAN:  Appears okay to go ahead with sleeve gastrectomy.  Will discuss with Dr. Biagio Quint.  [name], MD, [facility] Surgery Pager: [phone] Office phone:  [phone]

## 2012-05-28 ENCOUNTER — Encounter (HOSPITAL_COMMUNITY): Payer: Self-pay | Admitting: Surgery

## 2012-06-02 ENCOUNTER — Encounter (INDEPENDENT_AMBULATORY_CARE_PROVIDER_SITE_OTHER): Payer: Medicare Other | Admitting: General Surgery

## 2012-06-08 ENCOUNTER — Encounter (INDEPENDENT_AMBULATORY_CARE_PROVIDER_SITE_OTHER): Payer: Self-pay | Admitting: General Surgery

## 2012-06-08 ENCOUNTER — Ambulatory Visit (INDEPENDENT_AMBULATORY_CARE_PROVIDER_SITE_OTHER): Payer: Medicare Other | Admitting: General Surgery

## 2012-06-08 DIAGNOSIS — K279 Peptic ulcer, site unspecified, unspecified as acute or chronic, without hemorrhage or perforation: Secondary | ICD-10-CM

## 2012-06-08 NOTE — Progress Notes (Signed)
Subjective:     Patient ID: Randy Grimes, male   DOB: 04-13-1950, 62 y.o.   MRN: [id number]  HPI This patient follows up status post upper endoscopy by Dr. Ezzard Standing for preoperative clearance and evaluation for a sleeve gastrectomy. He has a history of peptic ulcer disease and long-standing reflux and I wanted to ensure that he had no residual ulcer changes or Barrett's esophagitis. Endoscopy was negative for any significant abnormalities or precancerous changes. He remains interested in sleeve gastrectomy and has no questions or concerns  Review of Systems     Objective:   Physical Exam NAD, nontoxic     Assessment:     Obesity and history of peptic ulcer disease He remains interested in the sleeve gastrectomy which I think is a good option for him especially given his history of peptic ulcer disease which has required 2 prior endoscopies. He had no evidence of any active disease but did have some scarring likely from prior treatments. There was no evidence of any Barrett's esophagitis. Thank given his endoscopy results, that we can go ahead and continue as planned with sleeve gastrectomy. He had no questions or concerns today    Plan:     We will continue with his preoperative workup and we're awaiting cardiac clearance. When this arrives we will go ahead and get him scheduled for this procedure

## 2012-06-10 ENCOUNTER — Encounter (INDEPENDENT_AMBULATORY_CARE_PROVIDER_SITE_OTHER): Payer: Medicare Other | Admitting: General Surgery

## 2012-07-13 ENCOUNTER — Other Ambulatory Visit (INDEPENDENT_AMBULATORY_CARE_PROVIDER_SITE_OTHER): Payer: Self-pay | Admitting: General Surgery

## 2012-07-27 ENCOUNTER — Encounter (HOSPITAL_COMMUNITY): Payer: Self-pay | Admitting: Pharmacy Technician

## 2012-07-29 ENCOUNTER — Encounter: Payer: Medicare Other | Attending: General Surgery | Admitting: *Deleted

## 2012-07-29 DIAGNOSIS — Z01818 Encounter for other preprocedural examination: Secondary | ICD-10-CM | POA: Insufficient documentation

## 2012-07-29 DIAGNOSIS — Z713 Dietary counseling and surveillance: Secondary | ICD-10-CM | POA: Insufficient documentation

## 2012-07-29 DIAGNOSIS — E119 Type 2 diabetes mellitus without complications: Secondary | ICD-10-CM | POA: Insufficient documentation

## 2012-08-03 ENCOUNTER — Other Ambulatory Visit (HOSPITAL_COMMUNITY): Payer: Self-pay | Admitting: *Deleted

## 2012-08-04 ENCOUNTER — Ambulatory Visit (INDEPENDENT_AMBULATORY_CARE_PROVIDER_SITE_OTHER): Payer: Medicare Other | Admitting: General Surgery

## 2012-08-04 ENCOUNTER — Encounter (HOSPITAL_COMMUNITY): Payer: Self-pay

## 2012-08-04 ENCOUNTER — Encounter (HOSPITAL_COMMUNITY)
Admission: RE | Admit: 2012-08-04 | Discharge: 2012-08-04 | Disposition: A | Discharge status: Home / Self Care | Payer: Medicare Other | Source: Ambulatory Visit | Attending: General Surgery | Admitting: General Surgery

## 2012-08-04 ENCOUNTER — Inpatient Hospital Stay (HOSPITAL_COMMUNITY): Admission: RE | Admit: 2012-08-04 | Payer: Medicare Other | Source: Ambulatory Visit

## 2012-08-04 ENCOUNTER — Encounter (INDEPENDENT_AMBULATORY_CARE_PROVIDER_SITE_OTHER): Payer: Self-pay | Admitting: General Surgery

## 2012-08-04 DIAGNOSIS — K439 Ventral hernia without obstruction or gangrene: Secondary | ICD-10-CM

## 2012-08-04 LAB — CBC WITH DIFFERENTIAL/PLATELET
HCT: 39.8 % (ref 39.0–52.0)
Hemoglobin: 13.5 g/dL (ref 13.0–17.0)
Lymphocytes Relative: 28 % (ref 12–46)
Lymphs Abs: 2 10*3/uL (ref 0.7–4.0)
MCV: 84.9 fL (ref 78.0–100.0)
Monocytes Absolute: 0.4 10*3/uL (ref 0.1–1.0)
Monocytes Relative: 6 % (ref 3–12)
Neutro Abs: 4.6 10*3/uL (ref 1.7–7.7)
WBC: 7.2 10*3/uL (ref 4.0–10.5)

## 2012-08-04 LAB — SURGICAL PCR SCREEN: Staphylococcus aureus: POSITIVE — AB

## 2012-08-04 LAB — COMPREHENSIVE METABOLIC PANEL
Albumin: 3.5 g/dL (ref 3.5–5.2)
Alkaline Phosphatase: 62 U/L (ref 39–117)
BUN: 20 mg/dL (ref 6–23)
Chloride: 103 mEq/L (ref 96–112)
Glucose, Bld: 98 mg/dL (ref 70–99)
Potassium: 4.3 mEq/L (ref 3.5–5.1)
Total Bilirubin: 0.3 mg/dL (ref 0.3–1.2)

## 2012-08-04 NOTE — Progress Notes (Signed)
08/04/12 1042  OBSTRUCTIVE SLEEP APNEA  Have you ever been diagnosed with sleep apnea through a sleep study? No  Do you snore loudly (loud enough to be heard through closed doors)?  0  Do you often feel tired, fatigued, or sleepy during the daytime? 0  Has anyone observed you stop breathing during your sleep? 0  Do you have, or are you being treated for high blood pressure? 0  BMI more than 35 kg/m2? 1  Age over 61 years old? 1  Neck circumference greater than 40 cm/18 inches? 1  Gender: 1  Obstructive Sleep Apnea Score 4  Score 4 or greater  Results sent to PCP

## 2012-08-04 NOTE — Progress Notes (Signed)
Patient ID: Randy Grimes, male   DOB: February 25, 1951, 62 y.o.   MRN: [id number]  Chief Complaint  Patient presents with  . Bariatric Pre-op    gastric sleeve 08/10/2012    HPI Randy Grimes is a 62 y.o. male.  This patient comes in today for his preoperative surgery evaluation in preparation for vertical sleeve gastrectomy. He has a BMI of 43 with obesity related comorbidities of depression, pedicles or disease, back pain, arthritis. He has completed his preoperative workup including manometry studies nutrition and a psychology consultation and has undergone upper endoscopy which was negative for any residual peptic ulcer disease or esophageal pathology. He remains interested in the vertical sleeve gastrectomy. He is currently on the preoperative diet and has lost 6 pounds. HPI  Past Medical History  Diagnosis Date  . Acute kidney failure, unspecified   . Allergic rhinitis, cause unspecified   . Other chronic pain   . Headache   . Unspecified pruritic disorder   . Hordeolum externum     Stye in R lower eyelid  . Obesity, Class III, BMI 40-49.9 (morbid obesity)   . Essential hypertension, benign     takes Cardura "sometimes"  . History of migraine     last one a couple of weeks ago  . Arthritis   . Chronic back pain   . GERD (gastroesophageal reflux disease)     takes Protonix daily  . History of gastric ulcer   . GI bleeding 1989  . History of blood transfusion     no abnormal reaction noted  . History of kidney stones   . Depressive disorder, not elsewhere classified     takes Cymbalta and Abilify daily    Past Surgical History  Procedure Laterality Date  . Ankle surgery    . Back surgery    . Hand surgery    . Leg surgery    . 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 Left testcle removed    . Vasectomy    . Tonsillectomy      as a child  . Tibia fracture surgery      multiple  . Spinal cord stimulator placed    . Spinal cord stimulator removed    . Nasal sinus  surgery  03/26/2012    with fusion  . Sinus endo w/fusion  03/26/2012    Procedure: ENDOSCOPIC SINUS SURGERY WITH FUSION NAVIGATION;  Surgeon: Melvenia Beam, MD;  Location: [facility];  Service: ENT;  Laterality: N/A;  LEFT ETHMOIDECTOMY; BILATERAL SPHENOIDECTOMY; LEFT MAXILLARY ANTROSTOMY WITH IMAGE GUIDANCE; REPAIR LEFT SPHENOID CSF LEAK; NASAL SEPTAL FLAP  . Abdominal fat graph  03/26/2012    Procedure: ABDOMINAL FAT GRAPH;  Surgeon: Melvenia Beam, MD;  Location[facility] OR;  Service: ENT;  Laterality: N/A;  . Esophagogastroduodenoscopy N/A 05/27/2012    Procedure: ESOPHAGOGASTRODUODENOSCOPY (EGD);  Surgeon: Kandis Cocking, MD;  Location: [facility];  Service: General;  Laterality: N/A;    Family History  Problem Relation Age of Onset  . Asthma Other   . Depression Other   . Diabetes Other   . Heart disease Other   . Hypertension Other   . Stroke Other     Social History History  Substance Use Topics  . Smoking status: Never Smoker   . Smokeless tobacco: Never Used  . Alcohol Use: No    No Known Allergies  Current Outpatient Prescriptions  Medication Sig Dispense Refill  . ARIPiprazole (ABILIFY) 2 MG tablet  Take 2 mg by mouth daily.       . diclofenac sodium (VOLTAREN) 1 % GEL Apply 2 g topically 4 (four) times daily as needed. Apply to back      . DULoxetine (CYMBALTA) 60 MG capsule Take 60 mg by mouth 2 (two) times daily.       . OXYCONTIN 30 MG T12A       . pantoprazole (PROTONIX) 40 MG tablet Take 40 mg by mouth daily.        No current facility-administered medications for this visit.    Review of Systems Review of Systems All other review of systems negative or noncontributory except as stated in the HPI  Blood pressure 130/68, pulse 62, temperature 97.2 F (36.2 C), temperature source Temporal, resp. rate 18, height 6' (1.829 m), weight 318 lb 6.4 oz (144.425 kg).  Physical Exam Physical Exam Physical Exam  Vitals reviewed. Constitutional: He is oriented to  person, place, and time. He appears well-developed and well-nourished. No distress.  HENT:  Head: Normocephalic and atraumatic.  Mouth/Throat: No oropharyngeal exudate.  Eyes: Conjunctivae and EOM are normal. Pupils are equal, round, and reactive to light. Right eye exhibits no discharge. Left eye exhibits no discharge. No scleral icterus.  Neck: Normal range of motion. No tracheal deviation present.  Cardiovascular: Normal rate, regular rhythm and normal heart sounds.   Pulmonary/Chest: Effort normal and breath sounds normal. No stridor. No respiratory distress. He has no wheezes. He has no rales. He exhibits no tenderness.  Abdominal: Soft. Bowel sounds are normal. He exhibits no distension and no mass. There is no tenderness. There is no rebound and no guarding.  Musculoskeletal: Normal range of motion. He exhibits no edema and no tenderness.  Neurological: He is alert and oriented to person, place, and time.  Skin: Skin is warm and dry. No rash noted. He is not diaphoretic. No erythema. No pallor.  Psychiatric: He has a normal mood and affect. His behavior is normal. Judgment and thought content normal.    Data Reviewed   Assessment    Obesity with a BMI of 43 in comorbidities of arthritis, depression peptic disease, and back pain He is ready for his weight loss surgery. He has completed his preoperative workup and I have reviewed his laboratory studies, EGD, and nutrition and cytology evaluations. We discussed the perioperative procedures and operative complications and he remains interested in sleeve gastrectomy. The risks of infection, bleeding, pain, scarring, weight regain, too little or too much weight loss, vitamin deficiencies and need for lifelong vitamin supplementation, hair loss, need for protein supplementation, leaks, stricture, reflux, food intolerance, need for reoperation and conversion to roux Y gastric bypass, need for open surgery, injury to spleen or surrounding  structures, DVT's, PE, and death again discussed with the patient and the patient expressed understanding and desires to proceed with laparoscopic vertical sleeve gastrectomy, possible open, intraoperative endoscopy.     Plan    Will plan for vertical sleeve gastrectomy next week.        [name] DAVID 08/04/2012, 9:48 AM

## 2012-08-04 NOTE — Progress Notes (Signed)
Chest x-ray 12/13 on EPIC, EKG 12/13 on EPIC

## 2012-08-04 NOTE — Patient Instructions (Addendum)
20 Randy Grimes  08/04/2012   Your procedure is scheduled on: 08/10/12  Report to [facility] at 1115 AM.  Call this number if you have problems the morning of surgery [phone]: [phone]   Remember:   Do not eat food or drink liquids After Midnight.     Take these medicines the morning of surgery with A SIP OF WATER: abilify, cymbalta, protonix   Do not wear jewelry, make-up or nail polish.  Do not wear lotions, powders, or perfumes. You may wear deodorant.  Do not shave 48 hours prior to surgery. Men may shave face and neck.  Do not bring valuables to the hospital.  Contacts, dentures or bridgework may not be worn into surgery.  Leave suitcase in the car. After surgery it may be brought to your room.  For patients admitted to the hospital, checkout time is 11:00 AM the day of discharge.    Please read over the following fact sheets that you were given: MRSA Information, incentive spirometry fact sheet Birdie Sons, RN  pre op nurse call if needed [phone]    FAILURE TO FOLLOW THESE INSTRUCTIONS MAY RESULT IN CANCELLATION OF YOUR SURGERY   Patient Signature: ___________________________________________

## 2012-08-10 ENCOUNTER — Inpatient Hospital Stay (HOSPITAL_COMMUNITY)
Admission: RE | Admit: 2012-08-10 | Discharge: 2012-08-14 | DRG: 621 | Disposition: A | Discharge status: Home / Self Care | Payer: Medicare Other | Source: Ambulatory Visit | Attending: General Surgery | Admitting: General Surgery

## 2012-08-10 ENCOUNTER — Inpatient Hospital Stay (HOSPITAL_COMMUNITY): Payer: Medicare Other | Admitting: Anesthesiology

## 2012-08-10 ENCOUNTER — Encounter (HOSPITAL_COMMUNITY): Payer: Self-pay | Admitting: Anesthesiology

## 2012-08-10 ENCOUNTER — Encounter (HOSPITAL_COMMUNITY): Payer: Self-pay | Admitting: *Deleted

## 2012-08-10 ENCOUNTER — Encounter (HOSPITAL_COMMUNITY)
Admission: RE | Disposition: A | Discharge status: Home / Self Care | Payer: Self-pay | Source: Ambulatory Visit | Attending: General Surgery

## 2012-08-10 DIAGNOSIS — F3289 Other specified depressive episodes: Secondary | ICD-10-CM | POA: Diagnosis present

## 2012-08-10 DIAGNOSIS — Z01812 Encounter for preprocedural laboratory examination: Secondary | ICD-10-CM

## 2012-08-10 DIAGNOSIS — M19079 Primary osteoarthritis, unspecified ankle and foot: Secondary | ICD-10-CM

## 2012-08-10 DIAGNOSIS — K449 Diaphragmatic hernia without obstruction or gangrene: Secondary | ICD-10-CM | POA: Diagnosis present

## 2012-08-10 DIAGNOSIS — D649 Anemia, unspecified: Secondary | ICD-10-CM | POA: Diagnosis not present

## 2012-08-10 DIAGNOSIS — Z6841 Body Mass Index (BMI) 40.0 and over, adult: Secondary | ICD-10-CM

## 2012-08-10 DIAGNOSIS — Z9884 Bariatric surgery status: Secondary | ICD-10-CM

## 2012-08-10 DIAGNOSIS — I10 Essential (primary) hypertension: Secondary | ICD-10-CM

## 2012-08-10 DIAGNOSIS — F329 Major depressive disorder, single episode, unspecified: Secondary | ICD-10-CM

## 2012-08-10 HISTORY — PX: ESOPHAGOGASTRODUODENOSCOPY: SHX5428

## 2012-08-10 HISTORY — PX: LAPAROSCOPIC GASTRIC SLEEVE RESECTION: SHX5895

## 2012-08-10 SURGERY — GASTRECTOMY, SLEEVE, LAPAROSCOPIC
Anesthesia: General | Site: Esophagus | Wound class: Clean Contaminated

## 2012-08-10 MED ORDER — LABETALOL HCL 5 MG/ML IV SOLN
10.0000 mg | INTRAVENOUS | Status: DC | PRN
  Administered 2012-08-10 (×3): 10 mg via INTRAVENOUS
  Filled 2012-08-10 (×2): qty 4

## 2012-08-10 MED ORDER — BUPIVACAINE HCL 0.25 % IJ SOLN
INTRAMUSCULAR | Status: DC | PRN
  Administered 2012-08-10: 15 mL

## 2012-08-10 MED ORDER — MEPERIDINE HCL 50 MG/ML IJ SOLN: 6.2500 mg | INTRAMUSCULAR | Status: DC | PRN

## 2012-08-10 MED ORDER — PROMETHAZINE HCL 25 MG/ML IJ SOLN
6.2500 mg | INTRAMUSCULAR | Status: DC | PRN
  Administered 2012-08-10: 6.25 mg via INTRAVENOUS

## 2012-08-10 MED ORDER — HYDRALAZINE HCL 20 MG/ML IJ SOLN
5.0000 mg | INTRAMUSCULAR | Status: DC | PRN
  Administered 2012-08-10 – 2012-08-12 (×3): 5 mg via INTRAVENOUS
  Filled 2012-08-10 (×3): qty 1

## 2012-08-10 MED ORDER — ACETAMINOPHEN 10 MG/ML IV SOLN
INTRAVENOUS | Status: DC | PRN
  Administered 2012-08-10: 1000 mg via INTRAVENOUS

## 2012-08-10 MED ORDER — HYDROMORPHONE HCL PF 1 MG/ML IJ SOLN
0.2500 mg | INTRAMUSCULAR | Status: DC | PRN
  Administered 2012-08-10 (×2): 0.5 mg via INTRAVENOUS

## 2012-08-10 MED ORDER — NITROGLYCERIN IN D5W 200-5 MCG/ML-% IV SOLN
2.0000 ug/min | INTRAVENOUS | Status: DC
  Administered 2012-08-11: 5 ug/min via INTRAVENOUS
  Filled 2012-08-10: qty 250

## 2012-08-10 MED ORDER — LACTATED RINGERS IV SOLN
INTRAVENOUS | Status: DC
  Administered 2012-08-10: 18:00:00 via INTRAVENOUS
  Administered 2012-08-10: 1000 mL via INTRAVENOUS
  Administered 2012-08-10: 16:00:00 via INTRAVENOUS

## 2012-08-10 MED ORDER — OXYCODONE-ACETAMINOPHEN 5-325 MG/5ML PO SOLN
5.0000 mL | ORAL | Status: DC | PRN
  Administered 2012-08-11 – 2012-08-14 (×8): 10 mL via ORAL
  Administered 2012-08-14: 5 mL via ORAL
  Filled 2012-08-10 (×9): qty 10

## 2012-08-10 MED ORDER — CISATRACURIUM BESYLATE (PF) 10 MG/5ML IV SOLN
INTRAVENOUS | Status: DC | PRN
  Administered 2012-08-10: 4 mg via INTRAVENOUS
  Administered 2012-08-10: 8 mg via INTRAVENOUS
  Administered 2012-08-10 (×2): 4 mg via INTRAVENOUS

## 2012-08-10 MED ORDER — 0.9 % SODIUM CHLORIDE (POUR BTL) OPTIME
TOPICAL | Status: DC | PRN
  Administered 2012-08-10: 1000 mL

## 2012-08-10 MED ORDER — NEOSTIGMINE METHYLSULFATE 1 MG/ML IJ SOLN
INTRAMUSCULAR | Status: DC | PRN
  Administered 2012-08-10: 5 mg via INTRAVENOUS

## 2012-08-10 MED ORDER — MORPHINE SULFATE 2 MG/ML IJ SOLN
2.0000 mg | Freq: Once | INTRAMUSCULAR | Status: AC
  Administered 2012-08-10: 2 mg via INTRAVENOUS

## 2012-08-10 MED ORDER — HEPARIN SODIUM (PORCINE) 5000 UNIT/ML IJ SOLN
5000.0000 [IU] | Freq: Three times a day (TID) | INTRAMUSCULAR | Status: DC
  Administered 2012-08-11 – 2012-08-12 (×5): 5000 [IU] via SUBCUTANEOUS
  Filled 2012-08-10 (×8): qty 1

## 2012-08-10 MED ORDER — HEPARIN SODIUM (PORCINE) 5000 UNIT/ML IJ SOLN
5000.0000 [IU] | Freq: Once | INTRAMUSCULAR | Status: AC
  Administered 2012-08-10: 5000 [IU] via SUBCUTANEOUS
  Filled 2012-08-10: qty 1

## 2012-08-10 MED ORDER — UNJURY VANILLA POWDER
2.0000 [oz_av] | Freq: Four times a day (QID) | ORAL | Status: DC
  Filled 2012-08-10 (×4): qty 27

## 2012-08-10 MED ORDER — SUCCINYLCHOLINE CHLORIDE 20 MG/ML IJ SOLN
INTRAMUSCULAR | Status: DC | PRN
  Administered 2012-08-10: 100 mg via INTRAVENOUS

## 2012-08-10 MED ORDER — TISSEEL VH 10 ML EX KIT
PACK | CUTANEOUS | Status: DC | PRN
  Administered 2012-08-10: 10 mL

## 2012-08-10 MED ORDER — LACTATED RINGERS IR SOLN
Status: DC | PRN
  Administered 2012-08-10: 3000 mL

## 2012-08-10 MED ORDER — FENTANYL CITRATE 0.05 MG/ML IJ SOLN
INTRAMUSCULAR | Status: DC | PRN
  Administered 2012-08-10 (×2): 100 ug via INTRAVENOUS

## 2012-08-10 MED ORDER — KCL IN DEXTROSE-NACL 20-5-0.45 MEQ/L-%-% IV SOLN
INTRAVENOUS | Status: DC
  Administered 2012-08-10 – 2012-08-11 (×2): via INTRAVENOUS
  Administered 2012-08-11 (×2): 1000 mL via INTRAVENOUS
  Administered 2012-08-13 (×2): via INTRAVENOUS
  Filled 2012-08-10 (×11): qty 1000

## 2012-08-10 MED ORDER — PROPOFOL 10 MG/ML IV BOLUS
INTRAVENOUS | Status: DC | PRN
  Administered 2012-08-10: 180 mg via INTRAVENOUS

## 2012-08-10 MED ORDER — ACETAMINOPHEN 10 MG/ML IV SOLN: 1000.0000 mg | Freq: Once | INTRAVENOUS | Status: DC | PRN

## 2012-08-10 MED ORDER — UNJURY CHOCOLATE CLASSIC POWDER
2.0000 [oz_av] | Freq: Four times a day (QID) | ORAL | Status: DC
  Administered 2012-08-12 – 2012-08-14 (×10): 2 [oz_av] via ORAL
  Filled 2012-08-10 (×4): qty 27

## 2012-08-10 MED ORDER — GLYCOPYRROLATE 0.2 MG/ML IJ SOLN
INTRAMUSCULAR | Status: DC | PRN
  Administered 2012-08-10: 0.6 mg via INTRAVENOUS

## 2012-08-10 MED ORDER — ONDANSETRON HCL 4 MG/2ML IJ SOLN
4.0000 mg | INTRAMUSCULAR | Status: DC | PRN
  Administered 2012-08-13: 4 mg via INTRAVENOUS
  Filled 2012-08-10: qty 2

## 2012-08-10 MED ORDER — LIDOCAINE-EPINEPHRINE 1 %-1:100000 IJ SOLN
INTRAMUSCULAR | Status: DC | PRN
  Administered 2012-08-10: 15 mL

## 2012-08-10 MED ORDER — MIDAZOLAM HCL 5 MG/5ML IJ SOLN
INTRAMUSCULAR | Status: DC | PRN
  Administered 2012-08-10: 2 mg via INTRAVENOUS

## 2012-08-10 MED ORDER — DEXAMETHASONE SODIUM PHOSPHATE 10 MG/ML IJ SOLN
INTRAMUSCULAR | Status: DC | PRN
  Administered 2012-08-10: 10 mg via INTRAVENOUS

## 2012-08-10 MED ORDER — HYDROMORPHONE HCL PF 1 MG/ML IJ SOLN
INTRAMUSCULAR | Status: DC | PRN
  Administered 2012-08-10 (×2): 1 mg via INTRAVENOUS

## 2012-08-10 MED ORDER — OXYCODONE HCL 5 MG PO TABS: 5.0000 mg | ORAL_TABLET | Freq: Once | ORAL | Status: DC | PRN

## 2012-08-10 MED ORDER — HYDRALAZINE HCL 20 MG/ML IJ SOLN
5.0000 mg | INTRAMUSCULAR | Status: AC | PRN
  Administered 2012-08-10 (×4): 5 mg via INTRAVENOUS

## 2012-08-10 MED ORDER — UNJURY CHICKEN SOUP POWDER
2.0000 [oz_av] | Freq: Four times a day (QID) | ORAL | Status: DC
  Filled 2012-08-10 (×4): qty 27

## 2012-08-10 MED ORDER — MORPHINE SULFATE 2 MG/ML IJ SOLN
2.0000 mg | INTRAMUSCULAR | Status: DC | PRN
Start: 2012-08-10 — End: 2012-08-14
  Administered 2012-08-10: 6 mg via INTRAVENOUS
  Administered 2012-08-10: 4 mg via INTRAVENOUS
  Administered 2012-08-10: 2 mg via INTRAVENOUS
  Administered 2012-08-11 (×8): 6 mg via INTRAVENOUS
  Administered 2012-08-11: 2 mg via INTRAVENOUS
  Administered 2012-08-11 – 2012-08-14 (×21): 6 mg via INTRAVENOUS
  Filled 2012-08-10 (×3): qty 3
  Filled 2012-08-10: qty 1
  Filled 2012-08-10 (×3): qty 3
  Filled 2012-08-10: qty 1
  Filled 2012-08-10 (×17): qty 3
  Filled 2012-08-10: qty 2
  Filled 2012-08-10 (×7): qty 3
  Filled 2012-08-10: qty 1
  Filled 2012-08-10: qty 3

## 2012-08-10 MED ORDER — OXYCODONE HCL 5 MG/5ML PO SOLN
5.0000 mg | Freq: Once | ORAL | Status: DC | PRN
  Filled 2012-08-10: qty 5

## 2012-08-10 MED ORDER — SODIUM CHLORIDE 0.9 % IV SOLN
1.0000 g | INTRAVENOUS | Status: AC
  Administered 2012-08-10: 1 g via INTRAVENOUS

## 2012-08-10 SURGICAL SUPPLY — 60 items
APPLICATOR COTTON TIP 6IN STRL (MISCELLANEOUS) IMPLANT
APPLIER CLIP ROT 10 11.4 M/L (STAPLE)
CABLE HIGH FREQUENCY MONO STRZ (ELECTRODE) ×3 IMPLANT
CANISTER SUCTION 2500CC (MISCELLANEOUS) ×6 IMPLANT
CHLORAPREP W/TINT 26ML (MISCELLANEOUS) ×6 IMPLANT
CLIP APPLIE ROT 10 11.4 M/L (STAPLE) IMPLANT
CLOTH BEACON ORANGE TIMEOUT ST (SAFETY) ×3 IMPLANT
DERMABOND ADVANCED (GAUZE/BANDAGES/DRESSINGS)
DERMABOND ADVANCED .7 DNX12 (GAUZE/BANDAGES/DRESSINGS) IMPLANT
DEVICE SUTURE ENDOST 10MM (ENDOMECHANICALS) IMPLANT
DRAIN CHANNEL 19F RND (DRAIN) ×3 IMPLANT
DRAPE LAPAROSCOPIC ABDOMINAL (DRAPES) ×3 IMPLANT
DRAPE UTILITY 15X26 (DRAPE) ×6 IMPLANT
ELECT REM PT RETURN 9FT ADLT (ELECTROSURGICAL) ×3
ELECTRODE REM PT RTRN 9FT ADLT (ELECTROSURGICAL) ×2 IMPLANT
EVACUATOR DRAINAGE 10X20 100CC (DRAIN) ×2 IMPLANT
EVACUATOR SILICONE 100CC (DRAIN) ×4 IMPLANT
GLOVE BIOGEL M 8.0 STRL (GLOVE) ×6 IMPLANT
GLOVE BIOGEL PI IND STRL 7.0 (GLOVE) ×2 IMPLANT
GLOVE BIOGEL PI INDICATOR 7.0 (GLOVE) ×1
GLOVE SURG SS PI 6.5 STRL IVOR (GLOVE) ×3 IMPLANT
GLOVE SURG SS PI 7.5 STRL IVOR (GLOVE) ×6 IMPLANT
GLOVE SURG SS PI 8.5 STRL IVOR (GLOVE) ×2
GLOVE SURG SS PI 8.5 STRL STRW (GLOVE) ×4 IMPLANT
GOWN STRL NON-REIN LRG LVL3 (GOWN DISPOSABLE) ×3 IMPLANT
GOWN STRL REIN 2XL XLG LVL4 (GOWN DISPOSABLE) ×3 IMPLANT
GOWN STRL REIN XL XLG (GOWN DISPOSABLE) ×9 IMPLANT
HANDLE STAPLE EGIA 4 XL (STAPLE) ×3 IMPLANT
HOVERMATT SINGLE USE (MISCELLANEOUS) ×3 IMPLANT
IV LACTATED RINGER IRRG 3000ML (IV SOLUTION) ×1
IV LR IRRIG 3000ML ARTHROMATIC (IV SOLUTION) ×2 IMPLANT
KIT BASIN OR (CUSTOM PROCEDURE TRAY) ×3 IMPLANT
MARKER SKIN DUAL TIP RULER LAB (MISCELLANEOUS) ×3 IMPLANT
NEEDLE SPNL 22GX3.5 QUINCKE BK (NEEDLE) ×3 IMPLANT
NS IRRIG 1000ML POUR BTL (IV SOLUTION) ×3 IMPLANT
PENCIL BUTTON HOLSTER BLD 10FT (ELECTRODE) ×3 IMPLANT
POUCH SPECIMEN RETRIEVAL 10MM (ENDOMECHANICALS) IMPLANT
RELOAD EGIA 60 MED/THCK PURPLE (STAPLE) ×12 IMPLANT
RELOAD TRI 2.0 60 XTHK VAS SUL (STAPLE) ×6 IMPLANT
SCISSORS LAP 5X35 DISP (ENDOMECHANICALS) ×3 IMPLANT
SEALANT SURGICAL APPL DUAL CAN (MISCELLANEOUS) ×3 IMPLANT
SET IRRIG TUBING LAPAROSCOPIC (IRRIGATION / IRRIGATOR) ×3 IMPLANT
SHEARS CURVED HARMONIC AC 45CM (MISCELLANEOUS) ×3 IMPLANT
SLEEVE ENDOPATH XCEL 5M (ENDOMECHANICALS) ×12 IMPLANT
SOLUTION ANTI FOG 6CC (MISCELLANEOUS) ×3 IMPLANT
SPONGE GAUZE 4X4 12PLY (GAUZE/BANDAGES/DRESSINGS) IMPLANT
SPONGE LAP 18X18 X RAY DECT (DISPOSABLE) ×3 IMPLANT
SUT ETHILON 2 0 PS N (SUTURE) ×3 IMPLANT
SUT MNCRL AB 4-0 PS2 18 (SUTURE) ×6 IMPLANT
SUT SURGIDAC NAB ES-9 0 48 120 (SUTURE) ×6 IMPLANT
SUT VICRYL 0 UR6 27IN ABS (SUTURE) ×3 IMPLANT
SYR 50ML LL SCALE MARK (SYRINGE) ×3 IMPLANT
TRAY FOLEY CATH 14FRSI W/METER (CATHETERS) ×3 IMPLANT
TRAY LAP CHOLE (CUSTOM PROCEDURE TRAY) ×3 IMPLANT
TROCAR BLADELESS 15MM (ENDOMECHANICALS) ×3 IMPLANT
TROCAR BLADELESS OPT 5 100 (ENDOMECHANICALS) ×3 IMPLANT
TUBING CONNECTING 10 (TUBING) ×3 IMPLANT
TUBING ENDO SMARTCAP (MISCELLANEOUS) ×3 IMPLANT
TUBING FILTER THERMOFLATOR (ELECTROSURGICAL) ×3 IMPLANT
WATER STERILE IRR 500ML POUR (IV SOLUTION) ×3 IMPLANT

## 2012-08-10 NOTE — Interval H&P Note (Signed)
History and Physical Interval Note:  08/10/2012 2:24 PM  Randy Grimes  has presented today for surgery, with the diagnosis of morbid obesity  The various methods of treatment have been discussed with the patient and family. After consideration of risks, benefits and other options for treatment, the patient has consented to  Procedure(s) with comments: LAPAROSCOPIC GASTRIC SLEEVE RESECTION (N/A) - Laparoscopic Sleeve Gastrectomy with EGD ESOPHAGOGASTRODUODENOSCOPY (EGD) (N/A) as a surgical intervention .  The patient's history has been reviewed, patient examined, no change in status, stable for surgery.  I have reviewed the patient's chart and labs.  Questions were answered to the patient's satisfaction.  He was seen and evaluated in the preop area.  Risks of the procedure again discussed in lay terms.  The risks of infection, bleeding, pain, scarring, weight regain, too little or too much weight loss, vitamin deficiencies and need for lifelong vitamin supplementation, hair loss, need for protein supplementation, leaks, stricture, reflux, food intolerance, need for reoperation and conversion to roux Y gastric bypass, need for open surgery, injury to spleen or surrounding structures, DVT's, PE, and death again discussed with the patient and the patient expressed understanding and desires to proceed with laparoscopic vertical sleeve gastrectomy, possible open, intraoperative endoscopy.  He has a wife who has had a RYGB and he desires sleeve gastrectomy.  He understands that he may have increased reflux disease or need for conversion to open or RYGB.      Biagio Quint, Darlisa Spruiell DAVID

## 2012-08-10 NOTE — Anesthesia Procedure Notes (Addendum)
Procedure Name: Intubation Date/Time: 08/10/2012 3:24 PM Performed by: Paulla Dolly A Pre-anesthesia Checklist: Patient identified, Timeout performed, Emergency Drugs available, Suction available and Patient being monitored Patient Re-evaluated:Patient Re-evaluated prior to inductionOxygen Delivery Method: Circle system utilized Preoxygenation: Pre-oxygenation with 100% oxygen Intubation Type: IV induction Ventilation: Two handed mask ventilation required Laryngoscope Size: Mac and 5 Grade View: Grade II Tube type: Oral Tube size: 7.5 mm Airway Equipment and Method: Stylet Placement Confirmation: ETT inserted through vocal cords under direct vision,  breath sounds checked- equal and bilateral and positive ETCO2 Secured at: 24 cm Tube secured with: Tape Dental Injury: Teeth and Oropharynx as per pre-operative assessment  Comments: Intubated by Glenna Fellows

## 2012-08-10 NOTE — Anesthesia Preprocedure Evaluation (Addendum)
Anesthesia Evaluation  Patient identified by MRN, date of birth, ID band Patient awake    Reviewed: Allergy & Precautions, H&P , NPO status , Patient's Chart, lab work & pertinent test results, reviewed documented beta blocker date and time   Airway Mallampati: II TM Distance: >3 FB Neck ROM: Full    Dental  (+) Teeth Intact and Dental Advisory Given   Pulmonary neg pulmonary ROS,  breath sounds clear to auscultation        Cardiovascular hypertension, Pt. on medications negative cardio ROS  Rhythm:Regular Rate:Normal     Neuro/Psych  Headaches, PSYCHIATRIC DISORDERS Depression    GI/Hepatic Neg liver ROS, GERD-  Medicated and Controlled,  Endo/Other  Morbid obesity  Renal/GU Renal disease     Musculoskeletal negative musculoskeletal ROS (+)   Abdominal (+) + obese,   Peds  Hematology negative hematology ROS (+)   Anesthesia Other Findings   Reproductive/Obstetrics                           Anesthesia Physical  Anesthesia Plan  ASA: III  Anesthesia Plan: General   Post-op Pain Management:    Induction: Intravenous  Airway Management Planned: Oral ETT  Additional Equipment:   Intra-op Plan:   Post-operative Plan: Extubation in OR  Informed Consent: I have reviewed the patients History and Physical, chart, labs and discussed the procedure including the risks, benefits and alternatives for the proposed anesthesia with the patient or authorized representative who has indicated his/her understanding and acceptance.   Dental advisory given  Plan Discussed with: CRNA  Anesthesia Plan Comments:         Anesthesia Quick Evaluation

## 2012-08-10 NOTE — Brief Op Note (Signed)
08/10/2012  6:04 PM  PATIENT:  Randy Grimes  62 y.o. male  PRE-OPERATIVE DIAGNOSIS:  morbid obesity  POST-OPERATIVE DIAGNOSIS:  morbid obesity  PROCEDURE:  Procedure(s): LAPAROSCOPIC GASTRIC SLEEVE RESECTION (N/A) ESOPHAGOGASTRODUODENOSCOPY (EGD) (N/A) Hiatal hernia  SURGEON:  Surgeon(s) and Role:    * [name], DO - Primary    * Valarie Merino, MD - Assisting  PHYSICIAN ASSISTANT:   ASSISTANTS: martin   ANESTHESIA:   general  EBL:  Total I/O In: 2000 [I.V.:2000] Out: 200 [Urine:200]  BLOOD ADMINISTERED:none  DRAINS: (79F) Jackson-Pratt drain(s) with closed bulb suction in the along the sleeve staple line   LOCAL MEDICATIONS USED:  MARCAINE    and LIDOCAINE   SPECIMEN:  Source of Specimen:  sleeve gastrectomy  DISPOSITION OF SPECIMEN:  PATHOLOGY  COUNTS:  YES  TOURNIQUET:  * No tourniquets in log *  DICTATION: .Other Dictation: Dictation Number dictated [id number]  PLAN OF CARE: Admit to inpatient   PATIENT DISPOSITION:  PACU - hemodynamically stable.   Delay start of Pharmacological VTE agent (>24hrs) due to surgical blood loss or risk of bleeding: no

## 2012-08-10 NOTE — Progress Notes (Signed)
Pt complaining of a stabbing pain in the mid chest area and left arm. Pt states he has not had this type of pain before and is rating the pain 8 on a scale from 1-10. EKG performed which shows NSR. Dr. Biagio Quint called and made aware of pt's complaints. Order to have cardiac labs draws received.

## 2012-08-10 NOTE — Transfer of Care (Signed)
Immediate Anesthesia Transfer of Care Note  Patient: Randy Grimes  Procedure(s) Performed: Procedure(s): LAPAROSCOPIC GASTRIC SLEEVE RESECTION (N/A) ESOPHAGOGASTRODUODENOSCOPY (EGD) (N/A)  Patient Location:PACU  Anesthesia Type:General  Level of Consciousness: sedated  Airway & Oxygen Therapy: Patient Spontanous Breathing and Patient connected to face mask oxygen  Post-op Assessment: Report given to PACU RN and Post -op Vital signs reviewed and stable  Post vital signs: Reviewed and stable  Complications: No apparent anesthesia complications

## 2012-08-10 NOTE — H&P (View-Only) (Signed)
Patient ID: Randy Grimes, male   DOB: 02/12/1951, 62 y.o.   MRN: [id number]  Chief Complaint  Patient presents with  . Bariatric Pre-op    gastric sleeve 08/10/2012    HPI Randy Grimes is a 61 y.o. male.  This patient comes in today for his preoperative surgery evaluation in preparation for vertical sleeve gastrectomy. He has a BMI of 43 with obesity related comorbidities of depression, pedicles or disease, back pain, arthritis. He has completed his preoperative workup including manometry studies nutrition and a psychology consultation and has undergone upper endoscopy which was negative for any residual peptic ulcer disease or esophageal pathology. He remains interested in the vertical sleeve gastrectomy. He is currently on the preoperative diet and has lost 6 pounds. HPI  Past Medical History  Diagnosis Date  . Acute kidney failure, unspecified   . Allergic rhinitis, cause unspecified   . Other chronic pain   . Headache   . Unspecified pruritic disorder   . Hordeolum externum     Stye in R lower eyelid  . Obesity, Class III, BMI 40-49.9 (morbid obesity)   . Essential hypertension, benign     takes Cardura "sometimes"  . History of migraine     last one a couple of weeks ago  . Arthritis   . Chronic back pain   . GERD (gastroesophageal reflux disease)     takes Protonix daily  . History of gastric ulcer   . GI bleeding 1989  . History of blood transfusion     no abnormal reaction noted  . History of kidney stones   . Depressive disorder, not elsewhere classified     takes Cymbalta and Abilify daily    Past Surgical History  Procedure Laterality Date  . Ankle surgery    . Back surgery    . Hand surgery    . Leg surgery    . 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 Left testcle removed    . Vasectomy    . Tonsillectomy      as a child  . Tibia fracture surgery      multiple  . Spinal cord stimulator placed    . Spinal cord stimulator removed    . Nasal sinus  surgery  03/26/2012    with fusion  . Sinus endo w/fusion  03/26/2012    Procedure: ENDOSCOPIC SINUS SURGERY WITH FUSION NAVIGATION;  Surgeon: Mitchell Gore, MD;  Location: [facility] OR;  Service: ENT;  Laterality: N/A;  LEFT ETHMOIDECTOMY; BILATERAL SPHENOIDECTOMY; LEFT MAXILLARY ANTROSTOMY WITH IMAGE GUIDANCE; REPAIR LEFT SPHENOID CSF LEAK; NASAL SEPTAL FLAP  . Abdominal fat graph  03/26/2012    Procedure: ABDOMINAL FAT GRAPH;  Surgeon: Mitchell Gore, MD;  Location: [facility] OR;  Service: ENT;  Laterality: N/A;  . Esophagogastroduodenoscopy N/A 05/27/2012    Procedure: ESOPHAGOGASTRODUODENOSCOPY (EGD);  Surgeon: David H Newman, MD;  Location: [facility] ENDOSCOPY;  Service: General;  Laterality: N/A;    Family History  Problem Relation Age of Onset  . Asthma Other   . Depression Other   . Diabetes Other   . Heart disease Other   . Hypertension Other   . Stroke Other     Social History History  Substance Use Topics  . Smoking status: Never Smoker   . Smokeless tobacco: Never Used  . Alcohol Use: No    No Known Allergies  Current Outpatient Prescriptions  Medication Sig Dispense Refill  . ARIPiprazole (ABILIFY) 2 MG tablet   Take 2 mg by mouth daily.       . diclofenac sodium (VOLTAREN) 1 % GEL Apply 2 g topically 4 (four) times daily as needed. Apply to back      . DULoxetine (CYMBALTA) 60 MG capsule Take 60 mg by mouth 2 (two) times daily.       . OXYCONTIN 30 MG T12A       . pantoprazole (PROTONIX) 40 MG tablet Take 40 mg by mouth daily.        No current facility-administered medications for this visit.    Review of Systems Review of Systems All other review of systems negative or noncontributory except as stated in the HPI  Blood pressure 130/68, pulse 62, temperature 97.2 F (36.2 C), temperature source Temporal, resp. rate 18, height 6' (1.829 m), weight 318 lb 6.4 oz (144.425 kg).  Physical Exam Physical Exam Physical Exam  Vitals reviewed. Constitutional: He is oriented to  person, place, and time. He appears well-developed and well-nourished. No distress.  HENT:  Head: Normocephalic and atraumatic.  Mouth/Throat: No oropharyngeal exudate.  Eyes: Conjunctivae and EOM are normal. Pupils are equal, round, and reactive to light. Right eye exhibits no discharge. Left eye exhibits no discharge. No scleral icterus.  Neck: Normal range of motion. No tracheal deviation present.  Cardiovascular: Normal rate, regular rhythm and normal heart sounds.   Pulmonary/Chest: Effort normal and breath sounds normal. No stridor. No respiratory distress. He has no wheezes. He has no rales. He exhibits no tenderness.  Abdominal: Soft. Bowel sounds are normal. He exhibits no distension and no mass. There is no tenderness. There is no rebound and no guarding.  Musculoskeletal: Normal range of motion. He exhibits no edema and no tenderness.  Neurological: He is alert and oriented to person, place, and time.  Skin: Skin is warm and dry. No rash noted. He is not diaphoretic. No erythema. No pallor.  Psychiatric: He has a normal mood and affect. His behavior is normal. Judgment and thought content normal.    Data Reviewed   Assessment    Obesity with a BMI of 43 in comorbidities of arthritis, depression peptic disease, and back pain He is ready for his weight loss surgery. He has completed his preoperative workup and I have reviewed his laboratory studies, EGD, and nutrition and cytology evaluations. We discussed the perioperative procedures and operative complications and he remains interested in sleeve gastrectomy. The risks of infection, bleeding, pain, scarring, weight regain, too little or too much weight loss, vitamin deficiencies and need for lifelong vitamin supplementation, hair loss, need for protein supplementation, leaks, stricture, reflux, food intolerance, need for reoperation and conversion to roux Y gastric bypass, need for open surgery, injury to spleen or surrounding  structures, DVT's, PE, and death again discussed with the patient and the patient expressed understanding and desires to proceed with laparoscopic vertical sleeve gastrectomy, possible open, intraoperative endoscopy.     Plan    Will plan for vertical sleeve gastrectomy next week.        Tinaya Ceballos DAVID 08/04/2012, 9:48 AM    

## 2012-08-10 NOTE — Progress Notes (Signed)
Patient informed that surgery is delayed. He verbalizes understanding. 

## 2012-08-10 NOTE — Progress Notes (Signed)
I was called by the nurse; the patient underwent gastric surgery this pm; post surgery developed chest pain with radiation to the L arm. He also has post surgical pain in the epigastric area.   Possible ACS post surgical intervention;   Plan: Achieve pain control Morphine 2 mg stat, initiate Nitroglycerine drip; EKG and troponin;   I asked the nurse to inform the primary team. Would benefit from cardiology consult.

## 2012-08-11 ENCOUNTER — Other Ambulatory Visit: Payer: Self-pay

## 2012-08-11 ENCOUNTER — Encounter (HOSPITAL_COMMUNITY): Payer: Self-pay | Admitting: Family Medicine

## 2012-08-11 DIAGNOSIS — Z9884 Bariatric surgery status: Secondary | ICD-10-CM

## 2012-08-11 DIAGNOSIS — I10 Essential (primary) hypertension: Secondary | ICD-10-CM

## 2012-08-11 LAB — CBC WITH DIFFERENTIAL/PLATELET
Basophils Absolute: 0 10*3/uL (ref 0.0–0.1)
Basophils Relative: 0 % (ref 0–1)
MCHC: 32.6 g/dL (ref 30.0–36.0)
Monocytes Absolute: 0.4 10*3/uL (ref 0.1–1.0)
Neutro Abs: 9.8 10*3/uL — ABNORMAL HIGH (ref 1.7–7.7)
Neutrophils Relative %: 89 % — ABNORMAL HIGH (ref 43–77)
RDW: 13.5 % (ref 11.5–15.5)

## 2012-08-11 LAB — COMPREHENSIVE METABOLIC PANEL
AST: 21 U/L (ref 0–37)
Albumin: 3.3 g/dL — ABNORMAL LOW (ref 3.5–5.2)
Chloride: 99 mEq/L (ref 96–112)
Creatinine, Ser: 0.74 mg/dL (ref 0.50–1.35)
Potassium: 4.5 mEq/L (ref 3.5–5.1)
Total Bilirubin: 0.3 mg/dL (ref 0.3–1.2)
Total Protein: 6.8 g/dL (ref 6.0–8.3)

## 2012-08-11 LAB — GLUCOSE, CAPILLARY: Glucose-Capillary: 141 mg/dL — ABNORMAL HIGH (ref 70–99)

## 2012-08-11 LAB — TROPONIN I
Troponin I: <0.3 ng/mL (ref <0.30)
Troponin I: <0.3 ng/mL (ref <0.30)
Troponin I: <0.3 ng/mL (ref <0.30)

## 2012-08-11 LAB — MAGNESIUM: Magnesium: 2.2 mg/dL (ref 1.5–2.5)

## 2012-08-11 MED ORDER — INSULIN ASPART 100 UNIT/ML ~~LOC~~ SOLN
0.0000 [IU] | SUBCUTANEOUS | Status: DC
  Administered 2012-08-11 – 2012-08-14 (×7): 1 [IU] via SUBCUTANEOUS

## 2012-08-11 MED ORDER — KETOROLAC TROMETHAMINE 30 MG/ML IJ SOLN
30.0000 mg | Freq: Once | INTRAMUSCULAR | Status: AC
  Administered 2012-08-11: 30 mg via INTRAVENOUS
  Filled 2012-08-11: qty 1

## 2012-08-11 MED ORDER — PANTOPRAZOLE SODIUM 40 MG IV SOLR
40.0000 mg | Freq: Every day | INTRAVENOUS | Status: DC
  Administered 2012-08-11: 40 mg via INTRAVENOUS
  Filled 2012-08-11 (×2): qty 40

## 2012-08-11 MED ORDER — METOPROLOL TARTRATE 25 MG/10 ML ORAL SUSPENSION
12.5000 mg | Freq: Four times a day (QID) | ORAL | Status: DC
  Administered 2012-08-11 (×2): 12.5 mg via ORAL
  Filled 2012-08-11 (×8): qty 5

## 2012-08-11 MED ORDER — KETOROLAC TROMETHAMINE 30 MG/ML IJ SOLN
30.0000 mg | Freq: Four times a day (QID) | INTRAMUSCULAR | Status: DC | PRN
  Administered 2012-08-11 (×2): 30 mg via INTRAVENOUS
  Filled 2012-08-11 (×2): qty 1

## 2012-08-11 MED ORDER — PANTOPRAZOLE SODIUM 40 MG PO PACK
40.0000 mg | PACK | Freq: Every day | ORAL | Status: DC
  Administered 2012-08-11 – 2012-08-14 (×4): 40 mg
  Filled 2012-08-11 (×5): qty 20

## 2012-08-11 NOTE — Progress Notes (Signed)
Inpatient Diabetes Program Recommendations  AACE/ADA: New Consensus Statement on Inpatient Glycemic Control (2013)  Target Ranges:  Prepandial:   less than 140 mg/dL      Peak postprandial:   less than 180 mg/dL (1-2 hours)      Critically ill patients:  140 - 180 mg/dL   Reason for Visit: Hyperglycemia  Results for ADIAN, JABLONOWSKI (MRN [id number]) as of 08/11/2012 13:48  Ref. Range 08/11/2012 03:45  Sodium Latest Range: 135-145 mEq/L 133 (L)  Potassium Latest Range: 3.5-5.1 mEq/L 4.5  Chloride Latest Range: 96-112 mEq/L 99  CO2 Latest Range: 19-32 mEq/L 28  BUN Latest Range: 6-23 mg/dL 12  Creatinine Latest Range: 0.50-1.35 mg/dL 8.29  Calcium Latest Range: 8.4-10.5 mg/dL 8.7  GFR calc non Af Amer Latest Range: >90 mL/min >90  GFR calc Af Amer Latest Range: >90 mL/min >90  Glucose Latest Range: 70-99 mg/dL 562 (H)    Inpatient Diabetes Program Recommendations Correction (SSI): Add Novolog sensitive Q4 hours HgbA1C: HgbA1C to assess glucose control prior to hospitalization   Will follow. Thank you. Ailene Ards, RD, LDN, CDE Inpatient Diabetes Coordinator [phone]

## 2012-08-11 NOTE — Consult Note (Signed)
[facility] Medical Consultation  Randy Grimes WUJ:[id number] DOB: 1950/11/06 DOA: 08/10/2012 PCP: Randy Lek, MD   Requesting physician: Dr. Biagio Grimes Date of consultation:  08/11/12 Reason for consultation: Peri-operative Htn Urgency  Impression/Recommendations Principal Problem:   S/P gastric bypass 08/10/12 for Obesity-Dr. Biagio Grimes Active Problems:   HTN (hypertension)    1. Hypertensive urgency-patient has been started on metoprolol 12.5 milligrams solution 4 times a day, hold for blood pressure below 120/80. At this time for the discontinue his nitroglycerin drip. He will continue on hydralazine 5 mg every 4 hourly when necessary for systolic blood pressure above 956.  We will up titrate medications as possible.  If his heart rate drops too low, we may need to place him on a clonidine patch which I will hold off on for now 2. S/p gastric sleeve surgery 08/10/12-perioperative analgesia, diet and other postsurgical instructions as per surgery-appreciate nutritionist input into his care appears to be on dextrose 5% and 100 cc an hour as well as protein supplements 4 times a day 3. ? Impaired glucose tolerance-get HbA1c 4. Morbid obesity, Body mass index is 41.55 kg/(m^2).-monitor as an outpatient 5. Chest pain-has a history of chronic pain, this was postsurgical and may been secondary to his hiatal hernia. 6. History GI bleed 2006-patient may need PPI-started Protonix 40 mg daily 7. Leukocytosis-potentially postoperative. No current fever therefore monitor only   I will followup again tomorrow. Please contact me if I can be of assistance in the meanwhile. Thank you for this consultation.  Chief Complaint: Peri-OP severe htn  HPI:  [name] with knonw Htn/Chronic LBP for a fall 30 years ago from 30 feet.  He doesn't currently take blood bp meds.  The last time these were taken were months ago-he doesn't recall what they were.  His wife reports he usually has pretty elevated  blood pressures and was seen previously at [facility]-he had a spinal cord stimulator that was removed in February. admitted for elective Gastric sleeve surgery Dr. Kris Grimes surgery seemed to have gone well.  He had been seen by Cardiologit and was tgiven pre-op clearance for this HIs blood rpessures were reported by Anesthesai to have bneen as high as 260 systo0lic and PCCM added Nitro Gtt to his meds to help control this, as he concurrently complained of some chest pain and tightness going down the left arm. EKG done at the time was not concerning for acute coronary syndrome, and troponins were negative  Review of Systems:  Currently has a 8-9/10 in stomach and shoulder and lower back-stomach pain is the worse.  NO CP or SOb currentyl.  No n/v  Nursing has ambualted him this am and he did okay witht hat.  .  Burping.  NO stool yet.  NO vomit or nasuea.  NO SOB  NO plapitations currently   Past Medical History  Diagnosis Date  . Acute kidney failure, unspecified   . Allergic rhinitis, cause unspecified   . Other chronic pain   . Unspecified pruritic disorder   . Hordeolum externum     Stye in R lower eyelid  . Obesity, Class III, BMI 40-49.9 (morbid obesity)   . Arthritis   . Chronic back pain   . GERD (gastroesophageal reflux disease)     takes Protonix daily  . History of gastric ulcer     x2  . GI bleeding 1989  . History of blood transfusion     no abnormal reaction noted  . Depressive  disorder, not elsewhere classified     takes Cymbalta and Abilify daily   Admission 07/16/07 for syncopy/Htn/Chronic pain GI bleed in 2005/2006-he was hospitalized in [place] and [place]   Past Surgical History  Procedure Laterality Date  . Ankle surgery Left   . Back surgery    . Hand surgery Left   . Leg surgery Bilateral   . 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 Left testcle removed    . Vasectomy    . Tonsillectomy      as a child  . Tibia fracture surgery       multiple  . Spinal cord stimulator placed    . Spinal cord stimulator removed    . Nasal sinus surgery  03/26/2012    with fusion, x3  . Sinus endo w/fusion  03/26/2012    Procedure: ENDOSCOPIC SINUS SURGERY WITH FUSION NAVIGATION;  Surgeon: Randy Beam, MD;  Location[facility] OR;  Service: ENT;  Laterality: N/A;  LEFT ETHMOIDECTOMY; BILATERAL SPHENOIDECTOMY; LEFT MAXILLARY ANTROSTOMY WITH IMAGE GUIDANCE; REPAIR LEFT SPHENOID CSF LEAK; NASAL SEPTAL FLAP  . Abdominal fat graph  03/26/2012    Procedure: ABDOMINAL FAT GRAPH;  Surgeon: Randy Beam, MD;  Location: [facility] OR;  Service: ENT;  Laterality: N/A;  . Esophagogastroduodenoscopy N/A 05/27/2012    Procedure: ESOPHAGOGASTRODUODENOSCOPY (EGD);  Surgeon: Randy Cocking, MD;  Location: [facility] ENDOSCOPY;  Service: General;  Laterality: N/A;  . Fracture surgery      left leg as child   Social History:  reports that he has never smoked. He has never used smokeless tobacco. He reports that  drinks alcohol. He reports that he does not use illicit drugs.  No Known Allergies Family History  Problem Relation Age of Onset  . Asthma Other   . Depression Other   . Diabetes Other   . Heart disease Other   . Hypertension Other   . Stroke Other     Prior to Admission medications   Medication Sig Start Date End Date Taking? Authorizing Provider  ARIPiprazole (ABILIFY) 2 MG tablet Take 2 mg by mouth daily.    Yes Historical Provider, MD  DULoxetine (CYMBALTA) 60 MG capsule Take 60 mg by mouth 2 (two) times daily.    Yes Historical Provider, MD  OXYCONTIN 30 MG T12A 30 mg 3 (three) times daily as needed.  07/08/12  Yes Historical Provider, MD  pantoprazole (PROTONIX) 40 MG tablet Take 40 mg by mouth daily.    Yes Historical Provider, MD  diclofenac sodium (VOLTAREN) 1 % GEL Apply 2 g topically 4 (four) times daily as needed. Apply to back    Historical Provider, MD   Physical Exam: Blood pressure 152/85, pulse 79, temperature 97.6 F (36.4 C), temperature  source Oral, resp. rate 17, height 6' (1.829 m), weight 139 kg (306 lb 7 oz), SpO2 96.00%. Filed Vitals:   08/11/12 0500 08/11/12 0600 08/11/12 0700 08/11/12 0800  BP: 149/71 174/90 145/71 152/85  Pulse: 65 65 79 79  Temp:    97.6 F (36.4 C)  TempSrc:    Oral  Resp: 17 19 18 17   Height:      Weight:      SpO2: 96% 97% 96% 96%     General:  Alert obese pleasant Caucasian male in some painful distress  Eyes: No pallor no icterus  ENT: Mucosa dry  Neck: Soft supple  Cardiovascular: S1-S2 no murmur rub or gallop although slightly tachycardic and wonders to 100 and  Respiratory: Clinically clear  Abdomen: Postop changes with laparoscopic incisions  Skin: No lower extremity edema  Musculoskeletal: Range of motion intact  Psychiatric: Euthymic  Neurologic: Grossly intact  Labs on Admission:  Basic Metabolic Panel:  Recent Labs Lab 08/04/12 1100 08/11/12 0345  NA 136 133*  K 4.3 4.5  CL 103 99  CO2 26 28  GLUCOSE 98 206*  BUN 20 12  CREATININE 0.77 0.74  CALCIUM 9.1 8.7  MG  --  2.2   Liver Function Tests:  Recent Labs Lab 08/04/12 1100 08/11/12 0345  AST 13 21  ALT 14 23  ALKPHOS 62 62  BILITOT 0.3 0.3  PROT 7.0 6.8  ALBUMIN 3.5 3.3*   No results found for this basename: LIPASE, AMYLASE,  in the last 168 hours No results found for this basename: AMMONIA,  in the last 168 hours CBC:  Recent Labs Lab 08/04/12 1100 08/11/12 0345  WBC 7.2 11.0*  NEUTROABS 4.6 9.8*  HGB 13.5 12.6*  HCT 39.8 38.6*  MCV 84.9 85.8  PLT 253 309   Cardiac Enzymes:  Recent Labs Lab 08/10/12 2208 08/11/12 0345  TROPONINI <0.30 <0.30   BNP: No components found with this basename: POCBNP,  CBG: No results found for this basename: GLUCAP,  in the last 168 hours  Radiological Exams on Admission: No results found.    Time spent: 71  Mahala Menghini [facility] Pager [phone]  If 7PM-7AM, please contact  night-coverage [email or web address] Password [facility] 08/11/2012, 9:50 AM

## 2012-08-11 NOTE — Progress Notes (Signed)
Utilization review completed.  

## 2012-08-11 NOTE — Anesthesia Postprocedure Evaluation (Signed)
Anesthesia Post Note  Patient: Randy Grimes  Procedure(s) Performed: Procedure(s) (LRB): LAPAROSCOPIC GASTRIC SLEEVE RESECTION (N/A) ESOPHAGOGASTRODUODENOSCOPY (EGD) (N/A)  Anesthesia type: General  Patient location: PACU  Post pain: Pain level controlled  Post assessment: Post-op Vital signs reviewed  Last Vitals: BP 198/92  Pulse 63  Temp(Src) 36.3 C (Oral)  Resp 20  Ht 6' (1.829 m)  Wt 311 lb 15.2 oz (141.5 kg)  BMI 42.3 kg/m2  SpO2 97%  Post vital signs: Reviewed  Level of consciousness: sedated  Complications: No apparent anesthesia complications

## 2012-08-11 NOTE — Op Note (Signed)
Randy Grimes, Randy Grimes               ACCOUNT NO.:  [id number]  MEDICAL RECORD NO.:  [id number]  LOCATION:  1235                         FACILITY:  [facility]  PHYSICIAN:  [name], MD       DATE OF BIRTH:  14-Jul-1950  DATE OF PROCEDURE:  08/10/2012 DATE OF DISCHARGE:                              OPERATIVE REPORT   PROCEDURE:  Laparoscopic vertical sleeve gastrectomy with intraoperative endoscopy and laparoscopic hiatal hernia repair.  SURGEON:  [name], MD  ASSISTANT:  Dr. Daphine Deutscher.  PREOPERATIVE DIAGNOSIS:  Morbid obesity.  POSTOPERATIVE DIAGNOSIS:  Morbid obesity.  ANESTHESIA:  General endotracheal anesthesia with 30 mL of 1% lidocaine with epinephrine and 0.25% Marcaine in a 50:50 mixture.  FLUIDS:  Two liter of crystalloid.  ESTIMATED BLOOD LOSS:  75 mL.  DRAINS:  A 19-French Blake drain placed along the sleeve staple line.  SPECIMEN:  Greater curvature of the stomach sent to pathology for permanent section.  COMPLICATIONS:  None apparent.  FINDINGS:  Small to moderate-sized sliding type hiatal hernia with anterior repair, sleeve gastrectomy performed with 36-French bougie.  INDICATION FOR PROCEDURE:  [name] is a 62 year old male with a BMI of 12 who has failed medical weight loss attempts, desires durable weight loss solution.  OPERATIVE DETAILS:  [name] was seen and evaluated in the preoperative area.  Risks and benefits of procedure were again discussed in lay terms.  Informed consent was obtained.  He was given subcu heparin and prophylactic antibiotics and taken to the operating room, placed on table in supine position.  General endotracheal anesthesia was obtained and Foley catheter was placed.  His abdomen was prepped and draped in a standard surgical fashion.  The procedure time-out was performed with all operative team members to confirm proper patient, procedure, and then a 5-mm Optiview trocar was used to dissect the peritoneum in the left  upper quadrant and pneumoperitoneum was obtained. Laparoscope was introduced and there was no evidence of bleeding or bowel injury, bladder injury.  A 5-mm left rectus post was placed. A 5- mm left upper quadrant port was placed and a 15-mm right rectus port was placed under direct visualization.  A 5-mm epigastric incision was made and Nathanson liver retractor was placed to retract the left lobe of the liver.  Additional 5 port was eventually placed for additional retraction for mobilization of the stomach.  We measured it out 5 cm from the pylorus and started to divide the short gastric vessels along the greater curvature of the stomach and the dissection was carried up around the greater curvature using Harmonic scalpel.  This was carried up around the spleen and visualization was very difficult due to the patient's body habitus and large amount of intra-abdominal fat.  An additional 5-mm port was placed laterally for the assistant for added traction.  The stomach was mobilized from the spleen in the left crus and after this was completely mobilized, was able to pull the stomach down and he had a pretty obvious sliding-type hiatal hernia.  I dissected the esophageal phrenic ligament and identified the right crus. I bluntly dissected the hiatus anteriorly and reduced the stomach from the hiatus.  Then after  we could visualize the GE junction and stayed in the stomach without any added traction, I approximated the right and left crus anteriorly with a figure-of-Eight 2-0 Ethibond sutures.  We then with the stomach completely mobilized to the lesser curvature again measured the distance from pylorus, 35 cm from the pylorus, started to divide the stomach using the black Tri-Staple loads taking care to avoid narrowing of the stomach at the angularis incisura.  The second black staple load was placed on the stomach and when I attempted to pass a 36- French bougie.  Due to the redundant  stomach, we could not pass the bougie along the lesser curvature, so I unclamped the stapler and passed to the bougie along the lesser curvature and then replaced the staple. The crotch of the prior stapler and certified and again that we were not narrowing the stomach at the angularis and the bougie was not incorporated into the staple and a second black Tri-Staple load was used to divide the to continue with the sleeve staple.  Then, I transitioned to triple 16 mm Tri-Staple load and checked to ensure that the bougie was not incorporated in the stapler and that we are not narrowing the stomach and each firing was placed at the crotch of the prior staple loads in order to avoid Christmas tree formation of the staple lines. This was carried up to the angle fist and we did put the final 2 staple firings, __________ posteriorly to make sure we were not leaving too much posterior stomach and we also took care to stay off the gastric fat pad and to minimize the chance of incorporating any esophagus with the stapler.  The stomach was completely transected and the staple line was inspected for hemostasis, which was noted be adequate and Dr. Daphine Deutscher performed upper endoscopy, while I had the stomach submerge and there was no evidence of any air bubbles or air leakage.  There was no evidence of internal bleeding in the stomach. The sleeve was easily traversed without any evidence of stricturing.  The endoscope was removed and I suctioned the fluid from the abdomen and Tisseel fibrin glue was placed along the staple lines.  The 15-mm port site was enlarged and the gastric remnant was removed from the abdomen and sent to Pathology for permanent section.  I then placed the 19-French Harrison Mons drain which is posterior to the sleeve staple lines and exited through the left upper quadrant trocar site and sutured in place with a nylon drain stitch.  The liver tractors removed and the stomach extraction site  a 15-mm port site were closed in open fashion 0 Vicryl sutures. The sutures were secured and the abdomen was reinsufflated and the abdomen was noted to be hemostatic without any evidence of bleeding or bowel injury.  The abdominal wall closure was noted be adequate without any evidence of bowel injury.  Also, he was noted to have an umbilical hernia which we knew preoperatively, but this I noticed upon initial trocar placement that this had incarcerated omentum in the hernia and this did not reduce with insufflation.  I did not take the fat out of the hernia and we will need to repair this hernia definitively after the weight loss.  All of the trocars removed and the wounds were injected with 30 mL of 1% lidocaine with epinephrine and 0.25% Marcaine in a 50:50 mixture.  The skin edges were approximated with 4-0 Monocryl subcuticular suture.  Skin was washed and dried and Dermabond was  applied.  All sponge, needle counts correct in the case.  The patient tolerated the procedure well without apparent complications.         ______________________________ [name], MD    BL/MEDQ  D:  08/10/2012  T:  08/11/2012  Job:  [id number]

## 2012-08-11 NOTE — Progress Notes (Signed)
1 Day Post-Op  Subjective: Hypertensive overnight.  No nausea.  Epigastric and LUQ abdominal pain.  Objective: Vital signs in last 24 hours: Temp:  [97.4 F (36.3 C)-98.1 F (36.7 C)] 97.5 F (36.4 C) (05/14 0400) Pulse Rate:  [61-81] 79 (05/14 0700) Resp:  [0-25] 18 (05/14 0700) BP: (145-239)/(67-130) 145/71 mmHg (05/14 0700) SpO2:  [89 %-100 %] 96 % (05/14 0700) Weight:  [306 lb 7 oz (139 kg)-311 lb 15.2 oz (141.5 kg)] 306 lb 7 oz (139 kg) (05/14 0400) Last BM Date: 08/10/12  Intake/Output from previous day: 05/13 0701 - 05/14 0700 In: 3266.4 [I.V.:3266.4] Out: 2320 [Urine:2050; Drains:270] Intake/Output this shift:    General appearance: alert, cooperative and no distress Resp: nonlabored Cardio: normal rate, regular GI: soft, appropriate tenderness, ND, incisions without infection, no peritoneal signs, JP SS Extremities: SCD's bilat  Lab Results:   Recent Labs  08/11/12 0345  WBC 11.0*  HGB 12.6*  HCT 38.6*  PLT 309   BMET  Recent Labs  08/11/12 0345  NA 133*  K 4.5  CL 99  CO2 28  GLUCOSE 206*  BUN 12  CREATININE 0.74  CALCIUM 8.7   PT/INR No results found for this basename: LABPROT, INR,  in the last 72 hours ABG No results found for this basename: PHART, PCO2, PO2, HCO3,  in the last 72 hours  Studies/Results: No results found.  Anti-infectives: Anti-infectives   Start     Dose/Rate Route Frequency Ordered Stop   08/10/12 1128  ertapenem (INVANZ) 1 g in sodium chloride 0.9 % 50 mL IVPB     1 g 100 mL/hr over 30 Minutes Intravenous On call to O.R. 08/10/12 1128 08/10/12 1525      Assessment/Plan: s/p Procedure(s): LAPAROSCOPIC GASTRIC SLEEVE RESECTION (N/A) ESOPHAGOGASTRODUODENOSCOPY (EGD) (N/A) d/c foley Advance diet He looks okay but still hypertensive.  will ask hospitalist for BP recommendations.  remove foley.  Mobilize today.  no evidence of cardiac abnormality, his epigastric and left shoulder pain is likely from the [facility]  repair.  LOS: 1 day    Randy Grimes Randy Grimes 08/11/2012

## 2012-08-11 NOTE — Progress Notes (Signed)
Dr. Frederico Hamman called notified of pt's increased BP and complaints chest pain.  Pt had received labetolol previously with little decrease. Orders received at this time. Dr. Biagio Quint also notified on pt's condition and orders given by Dr. Frederico Hamman.

## 2012-08-11 NOTE — Progress Notes (Signed)
EKG done and faxed to [facility] per Dr. Liliane Channel request.

## 2012-08-12 DIAGNOSIS — I10 Essential (primary) hypertension: Secondary | ICD-10-CM

## 2012-08-12 LAB — COMPREHENSIVE METABOLIC PANEL
Alkaline Phosphatase: 57 U/L (ref 39–117)
BUN: 11 mg/dL (ref 6–23)
GFR calc Af Amer: >90 mL/min (ref >90)
Glucose, Bld: 136 mg/dL — ABNORMAL HIGH (ref 70–99)
Potassium: 4.1 mEq/L (ref 3.5–5.1)
Total Protein: 6 g/dL (ref 6.0–8.3)

## 2012-08-12 LAB — CBC WITH DIFFERENTIAL/PLATELET
Eosinophils Absolute: 0.1 10*3/uL (ref 0.0–0.7)
Eosinophils Relative: 1 % (ref 0–5)
HCT: 29.9 % — ABNORMAL LOW (ref 39.0–52.0)
Hemoglobin: 9.9 g/dL — ABNORMAL LOW (ref 13.0–17.0)
Lymphs Abs: 1.7 10*3/uL (ref 0.7–4.0)
MCH: 28.6 pg (ref 26.0–34.0)
MCV: 86.4 fL (ref 78.0–100.0)
Monocytes Relative: 5 % (ref 3–12)
Neutrophils Relative %: 73 % (ref 43–77)
RBC: 3.46 MIL/uL — ABNORMAL LOW (ref 4.22–5.81)

## 2012-08-12 LAB — GLUCOSE, CAPILLARY
Glucose-Capillary: 119 mg/dL — ABNORMAL HIGH (ref 70–99)
Glucose-Capillary: 119 mg/dL — ABNORMAL HIGH (ref 70–99)
Glucose-Capillary: 120 mg/dL — ABNORMAL HIGH (ref 70–99)
Glucose-Capillary: 131 mg/dL — ABNORMAL HIGH (ref 70–99)
Glucose-Capillary: 131 mg/dL — ABNORMAL HIGH (ref 70–99)

## 2012-08-12 LAB — ABO/RH: ABO/RH(D): O NEG

## 2012-08-12 MED ORDER — METOPROLOL TARTRATE 25 MG/10 ML ORAL SUSPENSION
25.0000 mg | Freq: Two times a day (BID) | ORAL | Status: DC
  Administered 2012-08-12 – 2012-08-14 (×4): 25 mg via ORAL
  Filled 2012-08-12 (×6): qty 10

## 2012-08-12 MED ORDER — OXYCODONE HCL ER 20 MG PO T12A
30.0000 mg | EXTENDED_RELEASE_TABLET | Freq: Two times a day (BID) | ORAL | Status: DC
  Administered 2012-08-12 – 2012-08-14 (×4): 30 mg via ORAL
  Filled 2012-08-12: qty 1
  Filled 2012-08-12: qty 3
  Filled 2012-08-12 (×2): qty 1

## 2012-08-12 NOTE — Progress Notes (Signed)
Still with abdominal pain.  He is on oxycontin regularly and I will restart this for him.  He is doing okay with liquids and no nausea or vomiting.  His abdomen is soft and ND, he does not have peritoneal signs, his JP has bloody output and I am concerned with the bloody output and the drop in HGB.  I will have to continue to hold the heparin and toradol and follow the HGB for now.  He is HD stable and BP and HR okay ( though he is on beta blocker now), UOP okay.  Will follow the HGB for now but if he continues to show signs of bleeding, he may need transfusion or possible reexploration.  I recommended that he increase his ambulation and make sure that he is wearing SCD's because we cannot give him the DVT prophylaxis as long as he shows signs of bleeding.

## 2012-08-12 NOTE — Consult Note (Signed)
[facility] Medical Consultation  DESMOND SZABO WUJ:[id number] DOB: December 18, 1950 DOA: 08/10/2012 PCP: Delorse Lek, MD   Requesting physician: Dr. 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 Biagio Quint Active Problems:   HTN (hypertension)    1. Hypertensive urgency-patient has been started on metoprolol 12.5 milligrams solution 4 times a day.  Blood pressure realtively well controlled with mild bradycardia into 50-60 range.  Suggest either  Change to metoprolol 25 bid, can crush-likely would be cheapest option for patient   Continue Metorprolol 12.5 liquid tid [ as 4 x a day might risk a little bit more bradycardia and risks non-compliance]                        2. S/p gastric sleeve surgery 08/10/12-perioperative analgesia, diet and other postsurgical instructions as per surgery-appreciate nutritionist input into his care appears to be on dextrose 5% and 100 cc an hour as well as protein supplements 4 times a day 3. ? Impaired glucose tolerance-A1c 6.1-probably woulod not treat in light of recent calorie restricting diet-this should itself help with increasing insulin sensitivity 4. Morbid obesity, Body mass index is 41.55 kg/(m^2).-monitor as an outpatient 5. Chest pain-has a history of chronic pain, this was postsurgical and may been secondary to his hiatal hernia. 6. History GI bleed 2006-patient may need PPI-started Protonix 40 mg daily 7. Leukocytosis-potentially postoperative. No current fever therefore monitor only   I will follow from a distance if needed.  Otherwise from my standpoint he is stable for telemetry and d/c home if stable from surgeon standpoint  Chief Complaint: Peri-OP severe htn  HPI:  [name] with knonw Htn/Chronic LBP for a fall 30 years ago from 30 feet.  He doesn't currently take blood bp meds.  The last  time these were taken were months ago-he doesn't recall what they were.  His wife reports he usually has pretty elevated blood pressures and was seen previously at [facility]-he had a spinal cord stimulator that was removed in February. admitted for elective Gastric sleeve surgery Dr. Kris Mouton surgery seemed to have gone well.  He had been seen by Cardiologit and was tgiven pre-op clearance for this HIs blood rpessures were reported by Anesthesai to have bneen as high as 260 systo0lic and PCCM added Nitro Gtt to his meds to help control this, as he concurrently complained of some chest pain and tightness going down the left arm. EKG done at the time was not concerning for acute coronary syndrome, and troponins were negative  Review of Systems:  Currently has a 8-9/10 in stomach and shoulder and lower back-stomach pain is the worse.  NO CP or SOb currentyl.  No n/v  Nursing has ambualted him this am and he did okay witht hat.  .  Burping.  NO stool yet.  NO vomit or nasuea.  NO SOB  NO plapitations currently   Past Medical History  Diagnosis Date  . Acute kidney failure, unspecified   . Allergic rhinitis, cause unspecified   . Other chronic pain   . Unspecified pruritic disorder   . Hordeolum externum     Stye in R lower eyelid  . Obesity, Class III, BMI 40-49.9 (morbid obesity)   . Arthritis   . Chronic back pain   . GERD (gastroesophageal reflux disease)     takes Protonix daily  . History of  gastric ulcer     x2  . GI bleeding 1989  . History of blood transfusion     no abnormal reaction noted  . Depressive disorder, not elsewhere classified     takes Cymbalta and Abilify daily   Admission 07/16/07 for syncopy/Htn/Chronic pain GI bleed in 2005/2006-he was hospitalized in [place] and [place]   Past Surgical History  Procedure Laterality Date  . Ankle surgery Left   . Back surgery    . Hand surgery Left   . Leg surgery Bilateral   . 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 Left testcle removed    . Vasectomy    . Tonsillectomy      as a child  . Tibia fracture surgery      multiple  . Spinal cord stimulator placed    . Spinal cord stimulator removed    . Nasal sinus surgery  03/26/2012    with fusion, x3  . Sinus endo w/fusion  03/26/2012    Procedure: ENDOSCOPIC SINUS SURGERY WITH FUSION NAVIGATION;  Surgeon: Melvenia Beam, MD;  Location: [facility] OR;  Service: ENT;  Laterality: N/A;  LEFT ETHMOIDECTOMY; BILATERAL SPHENOIDECTOMY; LEFT MAXILLARY ANTROSTOMY WITH IMAGE GUIDANCE; REPAIR LEFT SPHENOID CSF LEAK; NASAL SEPTAL FLAP  . Abdominal fat graph  03/26/2012    Procedure: ABDOMINAL FAT GRAPH;  Surgeon: Melvenia Beam, MD;  Location: [facility] OR;  Service: ENT;  Laterality: N/A;  . Esophagogastroduodenoscopy N/A 05/27/2012    Procedure: ESOPHAGOGASTRODUODENOSCOPY (EGD);  Surgeon: Kandis Cocking, MD;  Location: [facility];  Service: General;  Laterality: N/A;  . Fracture surgery      left leg as child  . Laparoscopic gastric sleeve resection N/A 08/10/2012    Procedure: LAPAROSCOPIC GASTRIC SLEEVE RESECTION;  Surgeon: [name], DO;  Location: [facility] ORS;  Service: General;  Laterality: N/A;  . Esophagogastroduodenoscopy N/A 08/10/2012    Procedure: ESOPHAGOGASTRODUODENOSCOPY (EGD);  Surgeon: [name], DO;  Location: [facility] ORS;  Service: General;  Laterality: N/A;   Social History:  reports that he has never smoked. He has never used smokeless tobacco. He reports that  drinks alcohol. He reports that he does not use illicit drugs.  No Known Allergies Family History  Problem Relation Age of Onset  . Asthma Other   . Depression Other   . Diabetes Other   . Heart disease Other   . Hypertension Other   . Stroke Other   . Stroke Father     Prior to Admission medications   Medication Sig Start Date End Date Taking? Authorizing Provider  ARIPiprazole (ABILIFY) 2 MG tablet Take 2 mg by mouth daily.    Yes Historical Provider, MD  DULoxetine (CYMBALTA)  60 MG capsule Take 60 mg by mouth 2 (two) times daily.    Yes Historical Provider, MD  OXYCONTIN 30 MG T12A 30 mg 3 (three) times daily as needed.  07/08/12  Yes Historical Provider, MD  pantoprazole (PROTONIX) 40 MG tablet Take 40 mg by mouth daily.    Yes Historical Provider, MD  diclofenac sodium (VOLTAREN) 1 % GEL Apply 2 g topically 4 (four) times daily as needed. Apply to back    Historical Provider, MD   Physical Exam: Blood pressure 162/91, pulse 57, temperature 97.4 F (36.3 C), temperature source Oral, resp. rate 17, height 6' (1.829 m), weight 139 kg (306 lb 7 oz), SpO2 99.00%. Filed Vitals:   08/12/12 0000 08/12/12 0100 08/12/12 0356 08/12/12 0614  BP:  126/68 149/87  162/91  Pulse:  57 58 57  Temp: 97.5 F (36.4 C)  97.4 F (36.3 C)   TempSrc: Oral  Oral   Resp:  14 16 17   Height:      Weight:      SpO2:  94% 96% 99%     General:  Alert obese pleasant Caucasian male in some painful distress  Eyes: No pallor no icterus  ENT: Mucosa dry  Neck: Soft supple  Cardiovascular: S1-S2 no murmur rub or gallop although slightly tachycardic and wonders to 100 and  Respiratory: Clinically clear  Abdomen: Postop changes with laparoscopic incisions t  Labs on Admission:  Basic Metabolic Panel:  Recent Labs Lab 08/11/12 0345  NA 133*  K 4.5  CL 99  CO2 28  GLUCOSE 206*  BUN 12  CREATININE 0.74  CALCIUM 8.7  MG 2.2   Liver Function Tests:  Recent Labs Lab 08/11/12 0345  AST 21  ALT 23  ALKPHOS 62  BILITOT 0.3  PROT 6.8  ALBUMIN 3.3*   No results found for this basename: LIPASE, AMYLASE,  in the last 168 hours No results found for this basename: AMMONIA,  in the last 168 hours CBC:  Recent Labs Lab 08/11/12 0345  WBC 11.0*  NEUTROABS 9.8*  HGB 12.6*  HCT 38.6*  MCV 85.8  PLT 309   Cardiac Enzymes:  Recent Labs Lab 08/10/12 2208 08/11/12 0345 08/11/12 0935  TROPONINI <0.30 <0.30 <0.30   BNP: No components found with this basename:  POCBNP,  CBG:  Recent Labs Lab 08/11/12 1559 08/11/12 1952 08/12/12 0038 08/12/12 0333  GLUCAP 141* 139* 119* 120*    Radiological Exams on Admission: No results found.    Time spent: 57  Rhetta Mura [facility] Pager [phone]  If 7PM-7AM, please contact night-coverage [email or web address] Password [facility] 08/12/2012, 7:51 AM

## 2012-08-12 NOTE — Progress Notes (Signed)
Paged MD Biagio Quint regarding lab results, MD Ezzard Standing called back no new orders Stanford Breed RN 08-12-2012 18:49pm

## 2012-08-12 NOTE — Progress Notes (Signed)
2 Days Post-Op  Subjective: Shoulder pain improved.  Still with upper abdominal pain but improved.  BP better and off nitro infusion.  No nausea or reflux. Tolerating liquids  Objective: Vital signs in last 24 hours: Temp:  [97.4 F (36.3 C)-98.3 F (36.8 C)] 97.4 F (36.3 C) (05/15 0356) Pulse Rate:  [57-79] 57 (05/15 0614) Resp:  [13-17] 17 (05/15 0614) BP: (126-162)/(62-92) 162/91 mmHg (05/15 0614) SpO2:  [94 %-99 %] 99 % (05/15 0614) Last BM Date: 08/10/12  Intake/Output from previous day: 05/14 0701 - 05/15 0700 In: 3337 [P.O.:450; I.V.:2887] Out: 1435 [Urine:1175; Drains:260] Intake/Output this shift:    General appearance: alert, cooperative and no distress Resp: nonlabored Cardio: HR 59, regular, BP 162/90 GI: soft, mild upper abdominal tenderness, ND, inciisons without infection, no peritoneal signs, JP thin bloody Extremities: SCD's bilat  Lab Results:   Recent Labs  08/11/12 0345  WBC 11.0*  HGB 12.6*  HCT 38.6*  PLT 309   BMET  Recent Labs  08/11/12 0345  NA 133*  K 4.5  CL 99  CO2 28  GLUCOSE 206*  BUN 12  CREATININE 0.74  CALCIUM 8.7   PT/INR No results found for this basename: LABPROT, INR,  in the last 72 hours ABG No results found for this basename: PHART, PCO2, PO2, HCO3,  in the last 72 hours  Studies/Results: No results found.  Anti-infectives: Anti-infectives   Start     Dose/Rate Route Frequency Ordered Stop   08/10/12 1128  ertapenem (INVANZ) 1 g in sodium chloride 0.9 % 50 mL IVPB     1 g 100 mL/hr over 30 Minutes Intravenous On call to O.R. 08/10/12 1128 08/10/12 1525      Assessment/Plan: s/p Procedure(s): LAPAROSCOPIC GASTRIC SLEEVE RESECTION (N/A) ESOPHAGOGASTRODUODENOSCOPY (EGD) (N/A) continue bariatric clears.  BP better controlled.  Pain also improved.  will need to see if we can control pain with oral meds prior to discharge to home.  he should be okay for transfer to the floor but will likely need to stay until  tomorrow.  will recheck HGB since drain is bloody. Hold heparin until HGB returns.  LOS: 2 days    [name] DAVID 08/12/2012

## 2012-08-13 ENCOUNTER — Other Ambulatory Visit (INDEPENDENT_AMBULATORY_CARE_PROVIDER_SITE_OTHER): Payer: Self-pay

## 2012-08-13 DIAGNOSIS — K912 Postsurgical malabsorption, not elsewhere classified: Secondary | ICD-10-CM

## 2012-08-13 LAB — CBC WITH DIFFERENTIAL/PLATELET
Basophils Absolute: 0 10*3/uL (ref 0.0–0.1)
Basophils Relative: 0 % (ref 0–1)
Eosinophils Absolute: 0.2 10*3/uL (ref 0.0–0.7)
Eosinophils Relative: 2 % (ref 0–5)
HCT: 30.4 % — ABNORMAL LOW (ref 39.0–52.0)
MCH: 27.2 pg (ref 26.0–34.0)
MCHC: 31.6 g/dL (ref 30.0–36.0)
MCV: 86.1 fL (ref 78.0–100.0)
Monocytes Absolute: 0.4 10*3/uL (ref 0.1–1.0)
Neutro Abs: 4.8 10*3/uL (ref 1.7–7.7)
RDW: 13.7 % (ref 11.5–15.5)

## 2012-08-13 LAB — GLUCOSE, CAPILLARY
Glucose-Capillary: 107 mg/dL — ABNORMAL HIGH (ref 70–99)
Glucose-Capillary: 108 mg/dL — ABNORMAL HIGH (ref 70–99)
Glucose-Capillary: 121 mg/dL — ABNORMAL HIGH (ref 70–99)
Glucose-Capillary: 92 mg/dL (ref 70–99)

## 2012-08-13 MED ORDER — PANTOPRAZOLE SODIUM 40 MG PO PACK: 40.0000 mg | PACK | Freq: Every day | ORAL | Status: DC

## 2012-08-13 MED ORDER — ONDANSETRON 4 MG PO TBDP: 4.0000 mg | ORAL_TABLET | Freq: Three times a day (TID) | ORAL | Status: DC | PRN

## 2012-08-13 MED ORDER — METOPROLOL TARTRATE 25 MG/10 ML ORAL SUSPENSION: 25.0000 mg | Freq: Two times a day (BID) | ORAL | Status: DC

## 2012-08-13 MED ORDER — OXYCODONE-ACETAMINOPHEN 5-325 MG/5ML PO SOLN: 5.0000 mL | ORAL | Status: DC | PRN

## 2012-08-13 NOTE — Progress Notes (Signed)
Hemoglobin/ hematocrit (9.4/ 29.5) results called to Dr. Biagio Quint.  No new orders at present.  Francene Finders, RN

## 2012-08-13 NOTE — Progress Notes (Signed)
Patient alert and oriented, pain continues to be around 6-7/10. Patient is tolerating fluids, plan to advance to protein shake today.  Reviewed Gastric sleeve discharge instructions with patient and patient is able to articulate understanding.    GASTRIC BYPASS / SLEEVE  Home Care Instructions  These instructions are to help you care for yourself when you go home.  Call: If you have any problems.   Call [phone] and ask for the surgeon on call   If you need immediate assistance come to the ER at [facility]. Tell the ER staff that you are a new post-op gastric bypass or gastric sleeve patient   Signs and symptoms to report:   Severe vomiting or nausea o If you cannot handle clear liquids for longer than 1 day, call your surgeon    Abdominal pain which does not get better after taking your pain medication   Fever greater than 100.4 F and chills   Heart rate over 100 beats a minute   Trouble breathing   Chest pain    Redness, swelling, drainage, or foul odor at incision (surgical) sites    If your incisions open or pull apart   Swelling or pain in calf (lower leg)   Diarrhea (Loose bowel movements that happen often), frequent watery, uncontrolled bowel movements   Constipation, (no bowel movements for 3 days) if this happens:  o Take Milk of Magnesia, 2 tablespoons by mouth, 3 times a day for 2 days if needed o Stop taking Milk of Magnesia once you have had a bowel movement o Call your doctor if constipation continues Or o Take Miralax  (instead of Milk of Magnesia) following the label instructions o Stop taking Miralax once you have had a bowel movement o Call your doctor if constipation continues   Anything you think is "abnormal for you"   Normal side effects after surgery:   Unable to sleep at night or unable to concentrate   Irritability   Being tearful (crying) or depressed These are common complaints, possibly related to your anesthesia, stress of surgery and change in  lifestyle, that usually go away a few weeks after surgery.  If these feelings continue, call your medical doctor.  Wound Care: You may have surgical glue, steri-strips, or staples over your incisions after surgery   Surgical glue:  Looks like a clear film over your incisions and will wear off a little at a time   Steri-strips : Adhesive strips of tape over your incisions. You may notice a yellowish color on the skin under the steri-strips. This is used to make the   steri-strips stick better. Do not pull the steri-strips off - let them fall off   Staples: Staples may be removed before you leave the hospital o If you go home with staples, call [facility] at for an appointment with your surgeon's nurse to have staples removed 10 days after surgery, [phone]   Showering: You may shower two (2) days after your surgery unless your surgeon tells you differently o Wash gently around incisions with warm soapy water, rinse well, and gently pat dry  o If you have a drain (tube from your incision), you may need someone to hold this while you shower  o No tub baths until staples are removed and incisions are healed     Medications:   Medications should be liquid or crushed if larger than the size of a dime   Extended release pills (medication that releases a  little bit at a time through the day) should not be crushed   Depending on the size and number of medications you take, you may need to space (take a few throughout the day)/change the time you take your medications so that you do not over-fill your pouch (smaller stomach)   Make sure you follow-up with your primary care physician to make medication changes needed during rapid weight loss and life-style changes   If you have diabetes, follow up with the doctor that orders your diabetes medication(s) within one week after surgery and check your blood sugar regularly.   Do not drive while taking narcotics (pain medications)   Do not take  acetaminophen (Tylenol) and Roxicet or Lortab Elixir at the same time since these pain medications contain acetaminophen  Diet:                    First 2 Weeks  You will see the nutritionist about two (2) weeks after your surgery. The nutritionist will increase the types of foods you can eat if you are handling liquids well:   If you have severe vomiting or nausea and cannot handle clear liquids lasting longer than 1 day, call your surgeon  Protein Shake   Drink at least 2 ounces of shake 5-6 times per day   Each serving of protein shakes (usually 8 - 12 ounces) should have a minimum of:  o 15 grams of protein  o And no more than 5 grams of carbohydrate    Goal for protein each day: o Men = 80 grams per day o Women = 60 grams per day   Protein powder may be added to fluids such as non-fat milk or Lactaid milk or Soy milk (limit to 35 grams added protein powder per serving)  Hydration   Slowly increase the amount of water and other clear liquids as tolerated (See Acceptable Fluids)   Slowly increase the amount of protein shake as tolerated     Sip fluids slowly and throughout the day   May use sugar substitutes in small amounts (no more than 6 - 8 packets per day; i.e. Splenda)  Fluid Goal   The first goal is to drink at least 8 ounces of protein shake/drink per day (or as directed by the nutritionist); some examples of protein shakes are [employer], Dillard's, EAS Edge HP, and Unjury. See handout from pre-op Bariatric Education Class: o Slowly increase the amount of protein shake you drink as tolerated o You may find it easier to slowly sip shakes throughout the day o It is important to get your proteins in first   Your fluid goal is to drink 64 - 100 ounces of fluid daily o It may take a few weeks to build up to this   32 oz (or more) should be clear liquids  And    32 oz (or more) should be full liquids (see below for examples)   Liquids should not contain sugar, caffeine,  or carbonation  Clear Liquids:   Water or Sugar-free flavored water (i.e. Fruit H2O, Propel)   Decaffeinated coffee or tea (sugar-free)   Crystal Lite, Wyler's Lite, Minute Maid Lite   Sugar-free Jell-O   Bouillon or broth   Sugar-free Popsicle:   *Less than 20 calories each; Limit 1 per day  Full Liquids: Protein Shakes/Drinks + 2 choices per day of other full liquids   Full liquids must be: o No More Than 12 grams of Carbs  per serving  o No More Than 3 grams of Fat per serving   Strained low-fat cream soup   Non-Fat milk   Fat-free Lactaid Milk   Sugar-free yogurt (Dannon Lite & Fit, Greek yogurt)      Vitamins and Minerals   Start 1 day after surgery unless otherwise directed by your surgeon   2 Chewable Multivitamin / Multimineral Supplement with iron (i.e. Centrum for Adults)   Vitamin B-12, 350 - 500 micrograms sub-lingual (place tablet under the tongue) each day   Chewable Calcium Citrate with Vitamin D-3 (Example: 3 Chewable Calcium Plus 600 with Vitamin D-3) o Take 500 mg three (3) times a day for a total of 1500 mg each day o Do not take all 3 doses of calcium at one time as it may cause constipation, and you can only absorb 500 mg  at a time  o Do not mix multivitamins containing iron with calcium supplements; take 2 hours apart o Do not substitute Tums (calcium carbonate) for your calcium   Menstruating women and those at risk for anemia (a blood disease that causes weakness) may need extra iron o Talk with your doctor to see if you need more iron   If you need extra iron: Total daily Iron recommendation (including Vitamins) is 50 to 100 mg Iron/day   Do not stop taking or change any vitamins or minerals until you talk to your nutritionist or surgeon   Your nutritionist and/or surgeon must approve all vitamin and mineral supplements   Activity and Exercise: It is important to continue walking at home.  Limit your physical activity as instructed by your doctor.   During this time, use these guidelines:   Do not lift anything greater than ten (10) pounds for at least two (2) weeks   Do not go back to work or drive until Designer, industrial/product says you can   You may have sex when you feel comfortable  o It is VERY important for male patients to use a reliable birth control method; fertility often increases after surgery  o Do not get pregnant for at least 18 months   Start exercising as soon as your doctor tells you that you can o Make sure your doctor approves any physical activity   Start with a simple walking program   Walk 5-15 minutes each day, 7 days per week.    Slowly increase until you are walking 30-45 minutes per day Consider joining our BELT program. [phone] or email [email or web address]   Special Instructions Things to remember:   Free counseling is available for you and your family through collaboration between [facility] and [place]. Please call [phone] and leave a message   Use your CPAP when sleeping if this applies to you   [facility] has a free Bariatric Surgery Support Group that meets monthly, the 3rd Thursday, 6 pm, [facility] Classrooms You can see classes online at [email or web address]   It is very important to keep all follow up appointments with your surgeon, nutritionist, primary care physician, and behavioral health practitioner o After the first year, please follow up with your bariatric surgeon and nutritionist at least once a year in order to maintain best weight loss results [facility]: [phone] [facility]: [phone] Bariatric Nurse Coordinator: [phone]

## 2012-08-13 NOTE — Progress Notes (Signed)
3 Days Post-Op  Subjective: Feeling better.  Tolerating liquids without any nausea  Objective: Vital signs in last 24 hours: Temp:  [97.2 F (36.2 C)-98.1 F (36.7 C)] 98 F (36.7 C) (05/16 0534) Pulse Rate:  [58-72] 69 (05/16 0534) Resp:  [16-18] 18 (05/16 0534) BP: (137-181)/(66-95) 176/83 mmHg (05/16 0534) SpO2:  [96 %-100 %] 96 % (05/16 0534) Last BM Date: 08/10/12  Intake/Output from previous day: 05/15 0701 - 05/16 0700 In: 1949.8 [P.O.:240; I.V.:1709.8] Out: 4135 [Urine:3775; Drains:360] Intake/Output this shift:    General appearance: alert, cooperative and no distress Resp: nonlabored Cardio: normal rate, regular GI: soft, incisional tenderness, improved, ND, no peritoneal signs, JP thinning Extremities: SCD's bilat  Lab Results:   Recent Labs  08/12/12 0815 08/12/12 1827 08/13/12 0401  WBC 8.1  --  7.5  HGB 9.9* 10.2* 9.6*  HCT 29.9* 30.9* 30.4*  PLT 223  --  234   BMET  Recent Labs  08/11/12 0345 08/12/12 0815  NA 133* 135  K 4.5 4.1  CL 99 102  CO2 28 24  GLUCOSE 206* 136*  BUN 12 11  CREATININE 0.74 0.69  CALCIUM 8.7 8.2*   PT/INR No results found for this basename: LABPROT, INR,  in the last 72 hours ABG No results found for this basename: PHART, PCO2, PO2, HCO3,  in the last 72 hours  Studies/Results: No results found.  Anti-infectives: Anti-infectives   Start     Dose/Rate Route Frequency Ordered Stop   08/10/12 1128  ertapenem (INVANZ) 1 g in sodium chloride 0.9 % 50 mL IVPB     1 g 100 mL/hr over 30 Minutes Intravenous On call to O.R. 08/10/12 1128 08/10/12 1525      Assessment/Plan: s/p Procedure(s): LAPAROSCOPIC GASTRIC SLEEVE RESECTION (N/A) ESOPHAGOGASTRODUODENOSCOPY (EGD) (N/A) He seems to be improving.  Pain improving.  no nausea.  HGB seems to be stabilizing.  JP output thinning as well.  HD stable and clinically improving.  will continue to monitor HGB and if continues to be stable, will plan for discharge to home  tomorrow.    LOS: 3 days    [name] DAVID 08/13/2012

## 2012-08-13 NOTE — Progress Notes (Signed)
Still doing okay.  Vitals normal.  Abdomen appropriate.  Drain output still thin bloody output but HGB stable.  Repeat HGB tomorrow and hopefully okay for discharge to home Saturday.

## 2012-08-14 LAB — HEMOGLOBIN AND HEMATOCRIT, BLOOD
HCT: 29.5 % — ABNORMAL LOW (ref 39.0–52.0)
Hemoglobin: 9.4 g/dL — ABNORMAL LOW (ref 13.0–17.0)

## 2012-08-14 NOTE — Progress Notes (Signed)
Discharged via wc to front entrance to meet wife and awaiting vehicle to carry home. Accompanied by NT. 

## 2012-08-14 NOTE — Progress Notes (Signed)
Pt verbalized understanding of dc instructions. Script x 4 given as provided by MD. Pt verbalized and demonstrated understanding of drain care ie., how to empty, how to charge and how to change dressing daily at drain site. Introduced to My Chart with verbalized understanding. Preparing for dc home. Awaiting wife to arrive.

## 2012-08-14 NOTE — Progress Notes (Signed)
Patient ID: Randy Grimes, male   DOB: 06/24/1950, 62 y.o.   MRN: [id number] 4 Days Post-Op  Subjective: Patient without complaints. Denies abdominal pain or nausea. Tolerating his liquid diet well.  Objective: Vital signs in last 24 hours: Temp:  [97.3 F (36.3 C)-98.1 F (36.7 C)] 97.5 F (36.4 C) (05/17 0500) Pulse Rate:  [52-56] 52 (05/17 0500) Resp:  [18-20] 18 (05/17 0500) BP: (126-157)/(71-86) 142/79 mmHg (05/17 0500) SpO2:  [94 %-96 %] 95 % (05/17 0500) Last BM Date: 08/10/12  Intake/Output from previous day: 05/16 0701 - 05/17 0700 In: 1194.5 [P.O.:500; I.V.:694.5] Out: 2921 [Urine:2825; Drains:95; Stool:1] Intake/Output this shift:    General appearance: alert, cooperative and no distress GI: normal findings: soft, non-tender Incision/Wound: incisions clean and dry without evidence of infection  Lab Results:   Recent Labs  08/12/12 0815  08/13/12 0401 08/13/12 0830 08/14/12 0440  WBC 8.1  --  7.5  --   --   HGB 9.9*  < > 9.6* 9.4* 9.4*  HCT 29.9*  < > 30.4* 29.5* 29.5*  PLT 223  --  234  --   --   < > = values in this interval not displayed. BMET  Recent Labs  08/12/12 0815  NA 135  K 4.1  CL 102  CO2 24  GLUCOSE 136*  BUN 11  CREATININE 0.69  CALCIUM 8.2*     Studies/Results: No results found.  Anti-infectives: Anti-infectives   Start     Dose/Rate Route Frequency Ordered Stop   08/10/12 1128  ertapenem (INVANZ) 1 g in sodium chloride 0.9 % 50 mL IVPB     1 g 100 mL/hr over 30 Minutes Intravenous On call to O.R. 08/10/12 1128 08/10/12 1525      Assessment/Plan: s/p Procedure(s): LAPAROSCOPIC GASTRIC SLEEVE RESECTION ESOPHAGOGASTRODUODENOSCOPY (EGD) Doing well. Postop anemia stabilized. Okay for discharge.   LOS: 4 days    Seila Liston T 08/14/2012

## 2012-08-15 NOTE — Progress Notes (Signed)
Bariatric Class:  Appt start time: 1730 end time:  1830.  Pre-Operative Nutrition Class  Patient was seen on 07/29/12 for Pre-Operative [facility]ariatric Surgery [facility]   Surgery date: 08/10/12 Surgery type: Sleeve Surgeon: Biagio Quint [facility] weight at [facility]: 319.7 (04/17/11) Goal weight:   Weight today: 317.5 lbs Weight change: 2.2 lbs Total weight lost: 2.2 lbs  TANITA  BODY COMP RESULTS  07/29/12   BMI (kg/m^2) 43.1   Fat Mass (lbs) 125.5   Fat Free Mass (lbs) 192.0   Total Body Water (lbs) 140.5   Samples given per MNT protocol: Bariatric Advantage Multivitamin Lot # [id number] Exp: 10/15  Bariatric Advantage Calcium Citrate Lot # [id number] Exp: 10/15  Celebrate Vitamins Multivitamin Lot # [id number] Exp: 07/15  Premier Protein Shake Lot # [id number] Exp: 04/12/13  Corliss Marcus Protein Powder Lot # [id number] Exp: 06/15  The following the learning objective met by the patient during this course:  Identifies Pre-Op Dietary Goals and will begin 2 weeks pre-operatively  Identifies appropriate sources of fluids and proteins   States protein recommendations and appropriate sources pre and post-operatively  Identifies Post-Operative Dietary Goals and will follow for 2 weeks post-operatively  Identifies appropriate multivitamin and calcium sources  Describes the need for physical activity post-operatively and will follow MD recommendations  States when to call healthcare provider regarding medication questions or post-operative complications  Handouts given during class include:  Pre-Op Bariatric Surgery Diet Handout  Protein Shake Handout  Post-Op Bariatric Surgery Nutrition Handout  BELT Program Information Flyer  Support Group Information Flyer  [facility] Bariatric Supplements Price List  Follow-Up Plan: Patient will follow-up at [facility] 2 weeks post operatively for diet advancement per MD.

## 2012-08-15 NOTE — Patient Instructions (Signed)
Follow:   Pre-Op Diet per MD 2 weeks prior to surgery  Phase 2- Liquids (clear/full) 2 weeks after surgery  Vitamin/Mineral/Calcium guidelines for purchasing bariatric supplements  Exercise guidelines pre and post-op per MD  Follow-up at [facility] in 2 weeks post-op for diet advancement. Contact Naseer Hearn as needed with questions/concerns. 

## 2012-08-16 NOTE — Progress Notes (Signed)
Discharge summary in [facility].  No need to send as is on [facility] review list

## 2012-08-16 NOTE — Discharge Summary (Signed)
Physician Discharge Summary  Patient ID: Randy Grimes MRN: [id number] DOB/AGE: 61/04/1950 61 y.o.  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 Biagio Quint Active Problems:   HTN (hypertension)   Discharged Condition: stable  Hospital Course: to OR 08/10/12 for sleeve gastrectomy.  Was placed in stepdown for BP management.  Started on oral regimen for BP control on POD 1.  Diet advanced.  On POD 2 he was transferred to the floor but had some bloody drainage in JP.  HGB checked and it had dropped about 3 points.  He was HD stable and pain continued to improve.  HE was feeling okay but he was observed to monitor the HGB.  It was stable and he was discharged to home on 08/14/12.  Consults: IM for BP control  Significant Diagnostic Studies: none  Treatments: surgery: 08/10/12 for sleeve gastrectomy  Disposition: 01-Home or Self Care  Discharge Orders   Future Appointments Provider Department Dept Phone   08/18/2012 8:45 AM [name], DO [facility], [place] [phone]   08/24/2012 4:00 PM [facility] Post-Op Class [facility] [phone]   09/10/2012 10:30 AM [name], DO [facility], [place] [phone]   Future Orders Complete By Expires     Discharge patient  As directed         Medication List    STOP taking these medications       pantoprazole 40 MG tablet  Commonly known as:  PROTONIX      TAKE these medications       ARIPiprazole 2 MG tablet  Commonly known as:  ABILIFY  Take 2 mg by mouth daily.     diclofenac sodium 1 % Gel  Commonly known as:  VOLTAREN  Apply 2 g topically 4 (four) times daily as needed. Apply to back     DULoxetine 60 MG capsule  Commonly known as:  CYMBALTA  Take 60 mg by mouth 2 (two) times daily.     metoprolol tartrate 25 mg/10 mL Susp  Commonly  known as:  LOPRESSOR  Take 10 mLs (25 mg total) by mouth 2 (two) times daily.     ondansetron 4 MG disintegrating tablet  Commonly known as:  ZOFRAN ODT  Take 1 tablet (4 mg total) by mouth every 8 (eight) hours as needed for nausea.     oxyCODONE-acetaminophen 5-325 MG/5ML solution  Commonly known as:  ROXICET  Take 5-10 mLs by mouth every 4 (four) hours as needed for pain.     OXYCONTIN 30 MG T12a  Generic drug:  OxyCODONE HCl ER  30 mg 3 (three) times daily as needed.     pantoprazole sodium 40 mg/20 mL Pack  Commonly known as:  PROTONIX  Place 20 mLs (40 mg total) into feeding tube daily.           Follow-up Information   Follow up with Sheryle Vice DAVID, DO. Schedule an appointment as soon as possible for a visit in 4 days.   Contact information:   [address] [place] [address] [phone]       Signed: [name] DAVID 08/16/2012, 7:26 AM

## 2012-08-18 ENCOUNTER — Encounter (INDEPENDENT_AMBULATORY_CARE_PROVIDER_SITE_OTHER): Payer: Self-pay | Admitting: General Surgery

## 2012-08-18 ENCOUNTER — Encounter (HOSPITAL_COMMUNITY): Payer: Self-pay

## 2012-08-18 ENCOUNTER — Ambulatory Visit (HOSPITAL_COMMUNITY): Admission: RE | Admit: 2012-08-18 | Payer: Medicare Other | Source: Ambulatory Visit

## 2012-08-18 ENCOUNTER — Ambulatory Visit (HOSPITAL_COMMUNITY)
Admission: RE | Admit: 2012-08-18 | Discharge: 2012-08-18 | Disposition: A | Discharge status: Home / Self Care | Payer: Medicare Other | Source: Ambulatory Visit | Attending: General Surgery | Admitting: General Surgery

## 2012-08-18 ENCOUNTER — Ambulatory Visit (INDEPENDENT_AMBULATORY_CARE_PROVIDER_SITE_OTHER): Payer: Medicare Other | Admitting: General Surgery

## 2012-08-18 ENCOUNTER — Ambulatory Visit (HOSPITAL_COMMUNITY): Payer: Medicare Other

## 2012-08-18 ENCOUNTER — Other Ambulatory Visit (HOSPITAL_COMMUNITY): Payer: Medicare Other

## 2012-08-18 VITALS — BP 120/70 | HR 71 | Temp 97.6°F | Resp 18 | Ht 72.0 in | Wt 298.6 lb

## 2012-08-18 DIAGNOSIS — N200 Calculus of kidney: Secondary | ICD-10-CM | POA: Insufficient documentation

## 2012-08-18 DIAGNOSIS — Z5189 Encounter for other specified aftercare: Secondary | ICD-10-CM

## 2012-08-18 DIAGNOSIS — K439 Ventral hernia without obstruction or gangrene: Secondary | ICD-10-CM | POA: Insufficient documentation

## 2012-08-18 DIAGNOSIS — Z9884 Bariatric surgery status: Secondary | ICD-10-CM | POA: Insufficient documentation

## 2012-08-18 DIAGNOSIS — Z4889 Encounter for other specified surgical aftercare: Secondary | ICD-10-CM

## 2012-08-18 DIAGNOSIS — K4090 Unilateral inguinal hernia, without obstruction or gangrene, not specified as recurrent: Secondary | ICD-10-CM | POA: Insufficient documentation

## 2012-08-18 DIAGNOSIS — N289 Disorder of kidney and ureter, unspecified: Secondary | ICD-10-CM | POA: Insufficient documentation

## 2012-08-18 DIAGNOSIS — D3010 Benign neoplasm of unspecified renal pelvis: Secondary | ICD-10-CM | POA: Insufficient documentation

## 2012-08-18 DIAGNOSIS — K8020 Calculus of gallbladder without cholecystitis without obstruction: Secondary | ICD-10-CM | POA: Insufficient documentation

## 2012-08-18 DIAGNOSIS — R1012 Left upper quadrant pain: Secondary | ICD-10-CM | POA: Insufficient documentation

## 2012-08-18 DIAGNOSIS — IMO0002 Reserved for concepts with insufficient information to code with codable children: Secondary | ICD-10-CM | POA: Insufficient documentation

## 2012-08-18 DIAGNOSIS — D1809 Hemangioma of other sites: Secondary | ICD-10-CM | POA: Insufficient documentation

## 2012-08-18 DIAGNOSIS — K9589 Other complications of other bariatric procedure: Secondary | ICD-10-CM | POA: Insufficient documentation

## 2012-08-18 LAB — COMPREHENSIVE METABOLIC PANEL
Albumin: 3.9 g/dL (ref 3.5–5.2)
Alkaline Phosphatase: 116 U/L (ref 39–117)
BUN: 16 mg/dL (ref 6–23)
Glucose, Bld: 102 mg/dL — ABNORMAL HIGH (ref 70–99)
Total Bilirubin: 0.8 mg/dL (ref 0.3–1.2)

## 2012-08-18 LAB — CBC
HCT: 32.6 % — ABNORMAL LOW (ref 39.0–52.0)
Hemoglobin: 11.2 g/dL — ABNORMAL LOW (ref 13.0–17.0)
MCH: 28.4 pg (ref 26.0–34.0)
MCHC: 34.4 g/dL (ref 30.0–36.0)
MCV: 82.5 fL (ref 78.0–100.0)

## 2012-08-18 MED ORDER — IOHEXOL 300 MG/ML  SOLN
25.0000 mL | Freq: Once | INTRAMUSCULAR | Status: AC | PRN
  Administered 2012-08-18: 25 mL via ORAL

## 2012-08-18 MED ORDER — IOHEXOL 300 MG/ML  SOLN
125.0000 mL | Freq: Once | INTRAMUSCULAR | Status: AC | PRN
  Administered 2012-08-18: 125 mL via INTRAVENOUS

## 2012-08-18 NOTE — Progress Notes (Addendum)
Subjective:     Patient ID: Randy Grimes, male   DOB: Sep 11, 1950, 62 y.o.   MRN: [id number]  HPI  This patient follows up one week status post arthroscopic vertical sleeve gastrectomy for obesity. He had some postoperative bleeding and we left a drain in place. He comes in today for drain removal. He says that he continues to feel upper abdominal pain which is pretty constant and he says he cannot describe it. It is not worse when eating and he seems to be his full liquid diet with protein shakes without difficulty. He did not have any nausea or vomiting or fevers or chills. Review of Systems     Objective:   Physical Exam He is in no acute distress and nontoxic-appearing he is sitting comfortably in a chair His abdomen is soft and doesn't have any significant tenderness on exam. (He says his pain is internal). ND.  No peritoneal signs.  His drain has thin and dark, old blood.    Assessment:     Status post vertical sleeve gastrectomy He continues to have upper abdominal pain which I think may be related to a hematoma or possibly the drain. The drain seemed to be draining darker blood and appears old without any evidence of continued bleeding. He does not have any peritonitis and his pain is not worse with using. His drain output is not murky and he did not have any fevers or chills or nausea.  I think that his symptoms are probably due to the intra-abdominal blood and hematoma but I am planning on sending him up for some laboratory studies today and a CT scan of the abdomen to evaluate for possible leak or abscess.  His blood pressure is better today. And his heart rate is normal. We will set him up for a CT scan of the abdomen to evaluate for possible leak.    Plan:     If his CT scan and labs are normal then I will consider pulling his drain which may be contributing to his pain.      Labs okay.  Wbc normal.  HGB improved.  I spoke with the radiologist and no evidence of leak.  He does  have hematoma around the drain as we suspected.  Will leave the drain for now.  He also recommended MRI for eval of the kidney mass after he improves.  I discussed this with the patient that I would recommend follow up MRI in another month to evaluate the kidney mass for possible malignancy.  We will see him back next week.

## 2012-08-22 ENCOUNTER — Encounter (INDEPENDENT_AMBULATORY_CARE_PROVIDER_SITE_OTHER): Payer: Self-pay | Admitting: General Surgery

## 2012-08-24 ENCOUNTER — Encounter: Payer: Medicare Other | Admitting: *Deleted

## 2012-08-24 ENCOUNTER — Other Ambulatory Visit (INDEPENDENT_AMBULATORY_CARE_PROVIDER_SITE_OTHER): Payer: Self-pay | Admitting: General Surgery

## 2012-08-24 ENCOUNTER — Encounter: Payer: Self-pay | Admitting: *Deleted

## 2012-08-24 NOTE — Patient Instructions (Addendum)
Patient to follow Phase 3A-Soft, High Protein Diet and follow-up at [facility] in 6 weeks for 2 months post-op nutrition visit for diet advancement. 

## 2012-08-24 NOTE — Progress Notes (Signed)
Bariatric Class:  Appt start time: 1600 end time:  1700.  2 Week Post-Operative Nutrition Class  Patient was seen on 08/24/2012 for Post-Operative Nutrition education at [facility].   Surgery date: 08/10/12  Surgery type: Sleeve  Surgeon: Biagio Quint  Start weight at [facility]: 319.7 (04/17/11)   Weight today: 300.0 lbs  Weight change: 17.5 lbs  Total weight lost: 19.7 lbs   TANITA BODY COMP RESULTS   07/29/12  08/24/12  BMI (kg/m^2)  43.1  refused  Fat Mass (lbs)  125.5    Fat Free Mass (lbs)  192.0    Total Body Water (lbs)  140.5     The following the learning objectives were met by the patient during this course:  Identifies Phase 3A (Soft, High Proteins) Dietary Goals and will begin from 2 weeks post-operatively to 2 months post-operatively  Identifies appropriate sources of fluids and proteins   States protein recommendations and appropriate sources post-operatively  Identifies the need for appropriate texture modifications, mastication, and bite sizes when consuming solids  Identifies appropriate multivitamin and calcium sources post-operatively  Describes the need for physical activity post-operatively and will follow MD recommendations  States when to call healthcare provider regarding medication questions or post-operative complications  Handouts given during class include:  Phase 3A: Soft, High Protein Diet Handout  Samples given during class include:   Nascobal bB2 Spray: 1 box/4 sprays Lot: [id number]; Exp: 10/15  Follow-Up Plan: Patient will follow-up at [facility] in 6 weeks for 2 months post-op nutrition visit for diet advancement per MD.

## 2012-08-27 ENCOUNTER — Ambulatory Visit (INDEPENDENT_AMBULATORY_CARE_PROVIDER_SITE_OTHER): Payer: Medicare Other | Admitting: General Surgery

## 2012-08-27 ENCOUNTER — Encounter (INDEPENDENT_AMBULATORY_CARE_PROVIDER_SITE_OTHER): Payer: Self-pay | Admitting: General Surgery

## 2012-08-27 VITALS — BP 118/70 | HR 60 | Temp 97.4°F | Resp 17 | Ht 72.0 in | Wt 293.0 lb

## 2012-08-27 DIAGNOSIS — Z5189 Encounter for other specified aftercare: Secondary | ICD-10-CM

## 2012-08-27 DIAGNOSIS — Z4889 Encounter for other specified surgical aftercare: Secondary | ICD-10-CM

## 2012-08-27 NOTE — Progress Notes (Signed)
Subjective:     Patient ID: Randy Grimes, male   DOB: 1950-12-07, 62 y.o.   MRN: [id number]  HPI This patient follows up over 2 weeks status post laparoscopic vertical sleeve gastrectomy.  This was complicated by intra-abdominal bleeding postop and intra-abdominal hematoma. He says that his pain is improving and really isn't having much drain output. Her drain output is darkened as well.  He denies any fevers or chills and is tolerating full liquid diet. Again, he says his abdominal pain is improving. His bowels are functioning. He is taking his protein supplements and vitamins and has no new complaints.  Review of Systems     Objective:   Physical Exam No acute Distress and nontoxic-appearing sitting comfortably in a chair, HR normal His abdomen is soft and nontender on exam his JP has dark fluid which is then consistent with resolving hematoma.  I removed his drain in clinic today and redressed the wound. The remainder of his incisions look great    Assessment:     Status post vertical sleeve gastrectomy-doing well He seems to be recovering appropriately from this procedure. And assure that the stream is doing much more for him. Despite having to strain in the middle of the hematoma he still had a persistent hematoma particulate does take some time for this to resolve. Ahead and removed his drain today and since he did not have any evidence of any leak. His heart is normal and his abdominal pain continued to improve and he has no evidence of any fevers or chills. We talked about his postoperative diet I think that he can go ahead and increase to soft diet and see how that goes. He says that he is walking about a 1/4 mile per day and a recommended that he slowly increase this as tolerated     Plan:     Increase diet as scheduled Increase physical activity and continue her vitamins and protein supplementations I will see him back in about one month for repeat evaluation or sooner as needed

## 2012-08-31 ENCOUNTER — Other Ambulatory Visit (INDEPENDENT_AMBULATORY_CARE_PROVIDER_SITE_OTHER): Payer: Self-pay

## 2012-08-31 DIAGNOSIS — N2889 Other specified disorders of kidney and ureter: Secondary | ICD-10-CM

## 2012-09-01 ENCOUNTER — Telehealth (INDEPENDENT_AMBULATORY_CARE_PROVIDER_SITE_OTHER): Payer: Self-pay

## 2012-09-01 ENCOUNTER — Encounter (INDEPENDENT_AMBULATORY_CARE_PROVIDER_SITE_OTHER): Payer: Self-pay

## 2012-09-01 NOTE — Telephone Encounter (Signed)
MR abdomen set up for 09/07/2012 at 5:00 pm. Patient aware. Patient aware to be NPO 4 hours prior to test. Will call with any questions.

## 2012-09-04 DIAGNOSIS — F4310 Post-traumatic stress disorder, unspecified: Secondary | ICD-10-CM | POA: Insufficient documentation

## 2012-09-04 DIAGNOSIS — Z8719 Personal history of other diseases of the digestive system: Secondary | ICD-10-CM | POA: Insufficient documentation

## 2012-09-06 ENCOUNTER — Telehealth (INDEPENDENT_AMBULATORY_CARE_PROVIDER_SITE_OTHER): Payer: Self-pay | Admitting: *Deleted

## 2012-09-06 NOTE — Telephone Encounter (Signed)
Patient called to ask why his is having an MRI. Spoke to Clyde who clarified that patient is having an MRI to evaluate a renal mass seen on the CT scan.

## 2012-09-07 ENCOUNTER — Ambulatory Visit (HOSPITAL_COMMUNITY)
Admission: RE | Admit: 2012-09-07 | Discharge: 2012-09-07 | Disposition: A | Discharge status: Home / Self Care | Payer: Medicare Other | Source: Ambulatory Visit | Attending: General Surgery | Admitting: General Surgery

## 2012-09-07 ENCOUNTER — Other Ambulatory Visit (INDEPENDENT_AMBULATORY_CARE_PROVIDER_SITE_OTHER): Payer: Self-pay

## 2012-09-07 ENCOUNTER — Telehealth (INDEPENDENT_AMBULATORY_CARE_PROVIDER_SITE_OTHER): Payer: Self-pay | Admitting: *Deleted

## 2012-09-07 DIAGNOSIS — D3000 Benign neoplasm of unspecified kidney: Secondary | ICD-10-CM | POA: Insufficient documentation

## 2012-09-07 DIAGNOSIS — N281 Cyst of kidney, acquired: Secondary | ICD-10-CM | POA: Insufficient documentation

## 2012-09-07 DIAGNOSIS — N2889 Other specified disorders of kidney and ureter: Secondary | ICD-10-CM

## 2012-09-07 DIAGNOSIS — Z9884 Bariatric surgery status: Secondary | ICD-10-CM | POA: Insufficient documentation

## 2012-09-07 DIAGNOSIS — K8020 Calculus of gallbladder without cholecystitis without obstruction: Secondary | ICD-10-CM | POA: Insufficient documentation

## 2012-09-07 DIAGNOSIS — N289 Disorder of kidney and ureter, unspecified: Secondary | ICD-10-CM | POA: Insufficient documentation

## 2012-09-07 DIAGNOSIS — F411 Generalized anxiety disorder: Secondary | ICD-10-CM

## 2012-09-07 MED ORDER — GADOBENATE DIMEGLUMINE 529 MG/ML IV SOLN
20.0000 mL | Freq: Once | INTRAVENOUS | Status: AC | PRN
  Administered 2012-09-07: 20 mL via INTRAVENOUS

## 2012-09-07 MED ORDER — LORAZEPAM 2 MG PO TABS: ORAL_TABLET | ORAL | Status: DC

## 2012-09-07 NOTE — Telephone Encounter (Signed)
Patient called this morning to state that the last time he had a MRI done he had to be sedated.  Biagio Quint MD paged at this time.

## 2012-09-07 NOTE — Progress Notes (Signed)
Spoke with Dr. Biagio Quint regarding patient having anxiety prior to MRI scheduled for today.  Verbal order by Dr. Biagio Quint to order Ativan 2mg , take 1 tablet by mouth prior to MRI, #1, 0 refills.  Called into [employer] in [place] [phone].  Patient aware of rx and will pick up.  Patient aware not to take prior to driving.  Patient to arrive 30 minutes prior to MRI and take medication once he has arrived at radiology.

## 2012-09-09 ENCOUNTER — Other Ambulatory Visit (INDEPENDENT_AMBULATORY_CARE_PROVIDER_SITE_OTHER): Payer: Self-pay

## 2012-09-09 ENCOUNTER — Telehealth (INDEPENDENT_AMBULATORY_CARE_PROVIDER_SITE_OTHER): Payer: Self-pay

## 2012-09-09 ENCOUNTER — Ambulatory Visit (INDEPENDENT_AMBULATORY_CARE_PROVIDER_SITE_OTHER): Payer: Medicare Other | Admitting: General Surgery

## 2012-09-09 DIAGNOSIS — D49519 Neoplasm of unspecified behavior of unspecified kidney: Secondary | ICD-10-CM

## 2012-09-09 NOTE — Telephone Encounter (Signed)
Called patient to make him aware that Dr. Delice Lesch not available today and I ask Dr. Biagio Quint to review and contact patient with final results.

## 2012-09-09 NOTE — Telephone Encounter (Signed)
Patient request MRI results . Advised MRI for gallstones was Negative and would forward this to Maryan Puls CMA  to follow up with patient

## 2012-09-10 ENCOUNTER — Ambulatory Visit (INDEPENDENT_AMBULATORY_CARE_PROVIDER_SITE_OTHER): Payer: Medicare Other | Admitting: General Surgery

## 2012-09-10 ENCOUNTER — Telehealth (INDEPENDENT_AMBULATORY_CARE_PROVIDER_SITE_OTHER): Payer: Self-pay

## 2012-09-10 ENCOUNTER — Telehealth (INDEPENDENT_AMBULATORY_CARE_PROVIDER_SITE_OTHER): Payer: Self-pay | Admitting: General Surgery

## 2012-09-10 NOTE — Telephone Encounter (Signed)
Called patient with appointment information to [facility].  Patient req'd to send information through Mychart.  Will send message to patient through [facility] with referral to urology appt date & time.

## 2012-09-10 NOTE — Telephone Encounter (Signed)
Called to schedule New Patient Referral at [facility] [phone].  Patient has appointment scheduled with Dr. Laverle Patter on 10/20/12 @ 8:00 am with arrival time of 7:45 for registration.  Patient office notes, OP note, Ct & MRI has been faxed to attn:  Dr. Laverle Patter @ [phone] fax confirmation rec'd & attached to outgoing fax .

## 2012-09-10 NOTE — Telephone Encounter (Signed)
I called him to discuss his MRI results.  He said his abdomen is feeling better.  I explained that he has a suspicious lesion on his kidney and that I was going to refer him to a kidney specialist for evaluation.  We will get him a urology referral/appt.

## 2012-09-22 ENCOUNTER — Other Ambulatory Visit (HOSPITAL_COMMUNITY): Payer: Self-pay | Admitting: Urology

## 2012-09-22 DIAGNOSIS — D4959 Neoplasm of unspecified behavior of other genitourinary organ: Secondary | ICD-10-CM

## 2012-09-22 DIAGNOSIS — N2889 Other specified disorders of kidney and ureter: Secondary | ICD-10-CM

## 2012-09-23 ENCOUNTER — Ambulatory Visit (INDEPENDENT_AMBULATORY_CARE_PROVIDER_SITE_OTHER): Payer: Medicare Other | Admitting: General Surgery

## 2012-09-23 ENCOUNTER — Encounter (INDEPENDENT_AMBULATORY_CARE_PROVIDER_SITE_OTHER): Payer: Self-pay | Admitting: General Surgery

## 2012-09-23 VITALS — BP 122/72 | HR 68 | Temp 97.8°F | Resp 16 | Ht 72.0 in | Wt 287.4 lb

## 2012-09-23 DIAGNOSIS — Z5189 Encounter for other specified aftercare: Secondary | ICD-10-CM

## 2012-09-23 DIAGNOSIS — Z4889 Encounter for other specified surgical aftercare: Secondary | ICD-10-CM

## 2012-09-23 NOTE — Progress Notes (Signed)
Subjective:     Patient ID: Randy Grimes, male   DOB: 21-Mar-1951, 62 y.o.   MRN: [id number]  HPI This patient follows up 6 weeks status post laparoscopic vertical sleeve gastrectomy for obesity. His hospital course was complicated with postoperative intra-abdominal hematoma but this seems to have improved. He says that he is off his pain medications required for surgery and remains only on his chronic OxyContin. He says that his abdominal pain is much improved and he is feeling satisfied with very small portions. He has no nausea or vomiting or heartburn. He says that as his abdominal pain has improved he has started biking and his biking about 1.5 miles per day. He just started this recently. Otherwise he has no complaints and says that his bowels are functioning normally and has no issues eating.  Review of Systems     Objective:   Physical Exam No distress and nontoxic-appearing His abdomen is soft and nontender on exam his incisions are healing well without any sign of infection. He still has an umbilical hernia which is fairly sizable but appears reducible    Assessment:     Status post vertical sleeve gastrectomy-improving He seems to be improving from his surgery and his postoperative hematoma seems to be improving. I am pleased that he has begun exercising do think that this will really help with his weight loss since it sounds like he is eating appropriately. 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 He has already seen Dr. Laverle Patter and he is planning on biopsy of this lesion. If he does ultimately come to surgery for his renal cell neoplasm, with the potential of repair he is umbilical hernia at the same time.     Plan:     Continue with low fat high protein diet and increase physical activity. Continue with protein supplements and multivitamins will see him back in  another 6 weeks for his three-month evaluation and we'll check some laboratory studies at that time. Continue followup with his urologist for further evaluation of the renal neoplasm

## 2012-09-30 ENCOUNTER — Other Ambulatory Visit (HOSPITAL_COMMUNITY): Payer: Medicare Other

## 2012-09-30 ENCOUNTER — Encounter (HOSPITAL_COMMUNITY): Payer: Self-pay | Admitting: Pharmacy Technician

## 2012-09-30 ENCOUNTER — Other Ambulatory Visit: Payer: Self-pay | Admitting: Radiology

## 2012-10-05 ENCOUNTER — Ambulatory Visit: Payer: Medicare Other | Admitting: *Deleted

## 2012-10-05 ENCOUNTER — Ambulatory Visit (HOSPITAL_COMMUNITY)
Admission: RE | Admit: 2012-10-05 | Discharge: 2012-10-05 | Disposition: A | Discharge status: Home / Self Care | Payer: Medicare Other | Source: Ambulatory Visit | Attending: Urology | Admitting: Urology

## 2012-10-05 ENCOUNTER — Encounter (HOSPITAL_COMMUNITY): Payer: Self-pay

## 2012-10-05 DIAGNOSIS — D3000 Benign neoplasm of unspecified kidney: Secondary | ICD-10-CM | POA: Insufficient documentation

## 2012-10-05 DIAGNOSIS — D4959 Neoplasm of unspecified behavior of other genitourinary organ: Secondary | ICD-10-CM

## 2012-10-05 DIAGNOSIS — N2889 Other specified disorders of kidney and ureter: Secondary | ICD-10-CM

## 2012-10-05 DIAGNOSIS — Z9884 Bariatric surgery status: Secondary | ICD-10-CM | POA: Insufficient documentation

## 2012-10-05 DIAGNOSIS — K219 Gastro-esophageal reflux disease without esophagitis: Secondary | ICD-10-CM | POA: Insufficient documentation

## 2012-10-05 DIAGNOSIS — Z79899 Other long term (current) drug therapy: Secondary | ICD-10-CM | POA: Insufficient documentation

## 2012-10-05 LAB — CBC
HCT: 36.7 % — ABNORMAL LOW (ref 39.0–52.0)
MCHC: 32.2 g/dL (ref 30.0–36.0)
MCV: 85.5 fL (ref 78.0–100.0)
Platelets: 256 10*3/uL (ref 150–400)
RDW: 13.3 % (ref 11.5–15.5)
WBC: 5.6 10*3/uL (ref 4.0–10.5)

## 2012-10-05 LAB — APTT: aPTT: 38 seconds — ABNORMAL HIGH (ref 24–37)

## 2012-10-05 MED ORDER — MIDAZOLAM HCL 2 MG/2ML IJ SOLN
INTRAMUSCULAR | Status: AC | PRN
  Administered 2012-10-05 (×4): 1 mg via INTRAVENOUS

## 2012-10-05 MED ORDER — FENTANYL CITRATE 0.05 MG/ML IJ SOLN
INTRAMUSCULAR | Status: AC | PRN
  Administered 2012-10-05: 50 ug via INTRAVENOUS
  Administered 2012-10-05 (×2): 100 ug via INTRAVENOUS

## 2012-10-05 MED ORDER — SODIUM CHLORIDE 0.9 % IV SOLN
Freq: Once | INTRAVENOUS | Status: AC
  Administered 2012-10-05: 12:00:00 via INTRAVENOUS

## 2012-10-05 MED ORDER — MIDAZOLAM HCL 2 MG/2ML IJ SOLN
INTRAMUSCULAR | Status: AC
  Filled 2012-10-05: qty 6

## 2012-10-05 MED ORDER — HYDROCODONE-ACETAMINOPHEN 5-325 MG PO TABS
1.0000 | ORAL_TABLET | ORAL | Status: DC | PRN
  Filled 2012-10-05: qty 2

## 2012-10-05 MED ORDER — FENTANYL CITRATE 0.05 MG/ML IJ SOLN
INTRAMUSCULAR | Status: AC
  Filled 2012-10-05: qty 6

## 2012-10-05 NOTE — H&P (Signed)
Chief Complaint: "I'm here for a kidney biopsy" Referring Physician:Borden HPI: Randy Grimes is an 62 y.o. male with newly found right renal mass. He recently underwent gastric bypass surgery and a follow up CT scan found the right renal lesion, which was then further evaluated by MRI. Although this lesion was also apparently noticed on an MRIs from 2007 and 2011. He is now scheduled for perc biopsy of this lesion at the request of Dr. Laverle Patter PMHx and meds reviewed. Pt recovering well from his recent surgery.  Past Medical History:  Past Medical History  Diagnosis Date  . Acute kidney failure, unspecified   . Allergic rhinitis, cause unspecified   . Other chronic pain   . Unspecified pruritic disorder   . Hordeolum externum     Stye in R lower eyelid  . Obesity, Class III, BMI 40-49.9 (morbid obesity)   . Arthritis   . Chronic back pain   . GERD (gastroesophageal reflux disease)     takes Protonix daily  . History of gastric ulcer     x2  . GI bleeding 1989  . History of blood transfusion     no abnormal reaction noted  . Depressive disorder, not elsewhere classified     takes Cymbalta and Abilify daily    Past Surgical History:  Past Surgical History  Procedure Laterality Date  . Ankle surgery Left   . Back surgery    . Hand surgery Left   . Leg surgery Bilateral   . 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 Left testcle removed    . Vasectomy    . Tonsillectomy      as a child  . Tibia fracture surgery      multiple  . Spinal cord stimulator placed    . Spinal cord stimulator removed    . Nasal sinus surgery  03/26/2012    with fusion, x3  . Sinus endo w/fusion  03/26/2012    Procedure: ENDOSCOPIC SINUS SURGERY WITH FUSION NAVIGATION;  Surgeon: Melvenia Beam, MD;  Location: [facility] OR;  Service: ENT;  Laterality: N/A;  LEFT ETHMOIDECTOMY; BILATERAL SPHENOIDECTOMY; LEFT MAXILLARY ANTROSTOMY WITH IMAGE GUIDANCE; REPAIR LEFT SPHENOID CSF LEAK; NASAL SEPTAL FLAP  .  Abdominal fat graph  03/26/2012    Procedure: ABDOMINAL FAT GRAPH;  Surgeon: Melvenia Beam, MD;  Location: [facility] OR;  Service: ENT;  Laterality: N/A;  . Esophagogastroduodenoscopy N/A 05/27/2012    Procedure: ESOPHAGOGASTRODUODENOSCOPY (EGD);  Surgeon: Kandis Cocking, MD;  Location: [facility];  Service: General;  Laterality: N/A;  . Fracture surgery      left leg as child  . Laparoscopic gastric sleeve resection N/A 08/10/2012    Procedure: LAPAROSCOPIC GASTRIC SLEEVE RESECTION;  Surgeon: [name], DO;  Location: [facility] ORS;  Service: General;  Laterality: N/A;  . Esophagogastroduodenoscopy N/A 08/10/2012    Procedure: ESOPHAGOGASTRODUODENOSCOPY (EGD);  Surgeon: [name], DO;  Location: [facility]S;  Service: General;  Laterality: N/A;    Family History:  Family History  Problem Relation Age of Onset  . Asthma Other   . Depression Other   . Diabetes Other   . Heart disease Other   . Hypertension Other   . Stroke Other   . Stroke Father     Social History:  reports that he has never smoked. He has never used smokeless tobacco. He reports that  drinks alcohol. He reports that he does not use illicit drugs.  Allergies: No Known Allergies  Medications:  ARIPiprazole (ABILIFY) 2 MG tablet (Taking) Sig - Route: Take 3 mg by mouth every evening. - Oral Class: Historical Med Number of times this order has been changed since signing: 4 Order [address] diclofenac sodium (VOLTAREN) 1 % GEL (Taking) Sig - Route: Apply 2 g topically 4 (four) times daily as needed (pain). Apply to back - Topical Class: Historical Med Number of times this order has been changed since signing: 4 [address] DULoxetine (CYMBALTA) 60 MG capsule (Taking) Sig - Route: Take 60 mg by mouth 2 (two) times daily. - Oral Class: Historical Med Number of times this order has been changed since signing: 2 [address] Multiple Vitamin (MULTIVITAMIN WITH MINERALS) TABS (Taking) Sig - Route: Take 1 tablet by mouth daily. -  Oral Class: Historical Med OxyCODONE HCl ER (OXYCONTIN) 30 MG T12A (Taking) Sig - Route: Take 30 mg by mouth 3 (three) times daily. - Oral Class: Historical Med pantoprazole (PROTONIX) 40 MG tablet (Taking)   Please HPI for pertinent positives, otherwise complete 10 system ROS negative.  Physical Exam: BP 135/95  Pulse 48  Temp(Src) 97.6 F (36.4 C) (Oral)  SpO2 99% There is no weight on file to calculate BMI.   General Appearance:  Alert, cooperative, no distress, appears stated age  Head:  Normocephalic, without obvious abnormality, atraumatic  ENT: Unremarkable  Neck: Supple, symmetrical, trachea midline  Lungs:   Clear to auscultation bilaterally, no w/r/r, respirations unlabored without use of accessory muscles.  Chest Wall:  No tenderness or deformity  Heart:  Regular rate and rhythm, S1, S2 normal, no murmur, rub or gallop.  Abdomen:   Soft, non-tender, non distended.  Neurologic: Normal affect, no gross deficits.   Results for orders placed during the hospital encounter of 10/05/12 (from the past 48 hour(s))  APTT     Status: Abnormal   Collection Time    10/05/12 11:30 AM      Result Value Range   aPTT 38 (*) 24 - 37 seconds   Comment:            IF BASELINE aPTT IS ELEVATED,     SUGGEST PATIENT RISK ASSESSMENT     BE USED TO DETERMINE APPROPRIATE     ANTICOAGULANT THERAPY.  CBC     Status: Abnormal   Collection Time    10/05/12 11:30 AM      Result Value Range   WBC 5.6  4.0 - 10.5 K/uL   RBC 4.29  4.22 - 5.81 MIL/uL   Hemoglobin 11.8 (*) 13.0 - 17.0 g/dL   HCT 16.1 (*) 09.6 - 04.5 %   MCV 85.5  78.0 - 100.0 fL   MCH 27.5  26.0 - 34.0 pg   MCHC 32.2  30.0 - 36.0 g/dL   RDW 40.9  81.1 - 91.4 %   Platelets 256  150 - 400 K/uL  PROTIME-INR     Status: None   Collection Time    10/05/12 11:30 AM      Result Value Range   Prothrombin Time 12.6  11.6 - 15.2 seconds   INR 0.96  0.00 - 1.49   No results found.  Assessment/Plan Right renal mass For CT/US  guided biopsy Discussed procedure, risks, complications, use of sedation. Labs reviewed. Consent signed in chart  Brayton El PA-C 10/05/2012, 12:20 PM

## 2012-10-05 NOTE — Procedures (Signed)
CT guided core biopsies of lesion in right kidney upper pole.  No immediate complication.

## 2012-10-06 ENCOUNTER — Ambulatory Visit: Payer: Medicare Other | Admitting: *Deleted

## 2012-10-14 ENCOUNTER — Encounter: Payer: Medicare Other | Attending: General Surgery | Admitting: *Deleted

## 2012-10-14 DIAGNOSIS — Z01818 Encounter for other preprocedural examination: Secondary | ICD-10-CM | POA: Insufficient documentation

## 2012-10-14 DIAGNOSIS — Z713 Dietary counseling and surveillance: Secondary | ICD-10-CM | POA: Insufficient documentation

## 2012-10-14 DIAGNOSIS — E119 Type 2 diabetes mellitus without complications: Secondary | ICD-10-CM | POA: Insufficient documentation

## 2012-10-14 NOTE — Patient Instructions (Addendum)
Goals:  Follow Phase 3B: High Protein + Non-Starchy Vegetables  Eat 3-6 small meals/snacks, every 3-5 hrs  Increase lean protein foods to meet 60-80g goal  Increase fluid intake to 64oz +  Avoid drinking 15 minutes before, during and 30 minutes after eating  Aim for >30 min of physical activity daily 

## 2012-10-14 NOTE — Progress Notes (Signed)
Follow-up visit:  8 Weeks Post-Operative Sleeve Surgery  Medical Nutrition Therapy:  Appt start time: 1230 end time:  1300.  Primary concerns today: Post-operative Bariatric Surgery Nutrition Management.  Surgery date: 08/10/12  Surgery type: Sleeve  Surgeon: Biagio Quint  Start weight at [facility]: 319.7 (04/17/11)   Weight today: 283.5 lbs  Weight change: 16.5 lbs  Total weight lost: 36.2 lbs   TANITA BODY COMP RESULTS   07/29/12  08/24/12 10/14/12  BMI (kg/m^2)  43.1  pt 38.5  Fat Mass (lbs)  125.5  refused 85.5  Fat Free Mass (lbs)  192.0  Tanita 198.0  Total Body Water (lbs)  140.5  today 145.0   Fluid intake: 35 + 2 L water, reg coffee (1 cup) Estimated total protein intake: Reports ~2-3 oz at meals  Medications: No changes reported Supplementation: Taking  Using straws: No Drinking while eating: No Hair loss: No Carbonated beverages: NO N/V/D/C: No Dumping syndrome: None  Recent physical activity:  Walking every day for 20 min; Uses weed wacker 3 times a week  Progress Towards Goal(s):  Some progress.  Handouts given during visit include:  Phase 3B: High Protein + Non-Starchy Vegetables   Nutritional Diagnosis:  [place]-3.3 Overweight/obesity related to past poor dietary habits and physical inactivity as evidenced by patient w/ recent Gastric Sleeve surgery following dietary guidelines for continued weight loss.    Intervention:  Nutrition education/diet advancement.  Monitoring/Evaluation:  Dietary intake, exercise, and body weight. Follow up in 1 months for 3 month post-op visit.

## 2012-11-11 ENCOUNTER — Encounter: Payer: Medicare Other | Attending: General Surgery | Admitting: *Deleted

## 2012-11-11 ENCOUNTER — Encounter: Payer: Self-pay | Admitting: *Deleted

## 2012-11-11 DIAGNOSIS — Z01818 Encounter for other preprocedural examination: Secondary | ICD-10-CM | POA: Insufficient documentation

## 2012-11-11 DIAGNOSIS — Z713 Dietary counseling and surveillance: Secondary | ICD-10-CM | POA: Insufficient documentation

## 2012-11-11 DIAGNOSIS — E119 Type 2 diabetes mellitus without complications: Secondary | ICD-10-CM | POA: Insufficient documentation

## 2012-11-11 NOTE — Progress Notes (Signed)
Follow-up visit:  12 Weeks Post-Operative Sleeve Surgery  Medical Nutrition Therapy:  Appt start time:  420   End time:  450.  Primary concerns today: Post-operative Bariatric Surgery Nutrition Management. Randy Grimes returns for 3 mo follow up with little weight loss.  Continues to eat high starch carbs at meals and recently vomited spaghetti (likely d/t overeating). Not taking calcium as he drinks 16 oz of whole milk a day. Discussed switching to lower fat milk and keeping carbs at 15g a meal.    Surgery date: 08/10/12  Surgery type: Sleeve  Surgeon: Biagio Quint  Start weight at [facility]: 319.7 (04/17/11)   Weight today: 280.0 lbs  Weight change:  3.5 lbs  Total weight lost: 39.7 lbs   TANITA BODY COMP RESULTS   07/29/12  08/24/12 10/14/12 11/11/12  BMI (kg/m^2)  43.1  pt 38.5 38.5  Fat Mass (lbs)  125.5  refused 85.5 95.5  Fat Free Mass (lbs)  192.0  [place]ita 198.0 184.5  Total Body Water (lbs)  140.5  today 145.0 135.0   Fluid intake:  2 L water, reg coffee (1 cup @ 2-3 times/week), 16 oz milk =  Estimated total protein intake:  Reports ~2-3 oz at meals - 60-80g Snacks on cheese.  Medications: No changes reported Supplementation: Taking; B12 injections, 2 MVI, just drinks whole milk (16 oz) - no supplemental calcium  Using straws: No Drinking while eating:  No Hair loss:  No Carbonated beverages:  No N/V/D/C:  Vomited spaghetti recently Dumping syndrome: None  Recent physical activity:  Walking every day for 20 min; Uses weed wacker 3 times a week  Progress Towards Goal(s):  Some progress.   Nutritional Diagnosis:  [name]-3.3 Overweight/obesity related to past poor dietary habits and physical inactivity as evidenced by patient w/ recent Gastric Sleeve surgery following dietary guidelines for continued weight loss.    Intervention:  Nutrition education/diet advancement.  Monitoring/Evaluation:  Dietary intake, exercise, and body weight. Follow up in 3 months for 6 month post-op visit.

## 2012-11-11 NOTE — Patient Instructions (Addendum)
Goals:  Follow Phase 3B: High Protein + Non-Starchy Vegetables  Continue intake of lean protein foods to meet 60-80g goal  Add 15 grams of carbohydrate (fruit, whole grains) with meals  Work on switching to 2% milk. Then gradually switch to 1% milk.   Aim for >30 min of physical activity daily

## 2012-11-24 ENCOUNTER — Encounter (INDEPENDENT_AMBULATORY_CARE_PROVIDER_SITE_OTHER): Payer: Self-pay | Admitting: General Surgery

## 2012-11-24 ENCOUNTER — Ambulatory Visit (INDEPENDENT_AMBULATORY_CARE_PROVIDER_SITE_OTHER): Payer: Medicare Other | Admitting: General Surgery

## 2012-11-24 VITALS — BP 132/74 | HR 56 | Temp 98.0°F | Resp 16 | Ht 72.0 in | Wt 275.2 lb

## 2012-11-24 DIAGNOSIS — Z9884 Bariatric surgery status: Secondary | ICD-10-CM

## 2012-11-24 DIAGNOSIS — Z09 Encounter for follow-up examination after completed treatment for conditions other than malignant neoplasm: Secondary | ICD-10-CM

## 2012-11-24 DIAGNOSIS — K912 Postsurgical malabsorption, not elsewhere classified: Secondary | ICD-10-CM

## 2012-11-24 NOTE — Progress Notes (Signed)
Subjective:     Patient ID: Randy Grimes, male   DOB: July 28, 1950, 62 y.o.   MRN: [id number]  HPI This patient follows up 3-1/2 months status post vertical sleeve gastrectomy. He has no complaints. He says that he has had some increase in his back pain and he is no longer able to bike.  He is really not able to exercise much and starts having pain after one fourth of a mile. He does have some occasional hunger but says he is taking his protein supplements and is vitamins. His abdominal pain has resolved. He is taking plenty of fluids and denies any neurologic symptoms. He says that he is taking about 1500-1800 calories per day  Review of Systems     Objective:   Physical Exam No distress and nontoxic-appearing No evidence of neurologic dysfunction His abdomen is soft and nontender exam    Assessment:     Status post vertical sleeve gastrectomy and doing well He is doing very well without any evidence of any postoperative complications. His abdominal pain has completely resolved. He has really slowed with his weight loss since he has not been able to exercise. I think it is also taking too many calories a day and 1500-1800 calories per day range. I recommended that he decrease this to around 1200 calories and increase his activity as tolerated and recommended low-impact exercise such as swimming. Also recommended that he follow up with an orthopedic surgeon and now that he is losing weight in order to discuss options for his back. We will also check some nutrition labs and I will see him back in about 3 months.     Plan:     Increase physical activity and decreased caloric intake Checks nutrition labs and we will followup with him in 3 months.

## 2012-11-26 ENCOUNTER — Ambulatory Visit: Payer: Medicare Other | Attending: Internal Medicine | Admitting: Internal Medicine

## 2012-11-26 ENCOUNTER — Encounter: Payer: Self-pay | Admitting: Internal Medicine

## 2012-11-26 VITALS — BP 150/90 | HR 48 | Temp 98.6°F | Resp 18 | Ht 72.0 in | Wt 278.0 lb

## 2012-11-26 DIAGNOSIS — F329 Major depressive disorder, single episode, unspecified: Secondary | ICD-10-CM

## 2012-11-26 DIAGNOSIS — M545 Low back pain, unspecified: Secondary | ICD-10-CM | POA: Insufficient documentation

## 2012-11-26 DIAGNOSIS — G8929 Other chronic pain: Secondary | ICD-10-CM | POA: Insufficient documentation

## 2012-11-26 LAB — COMPREHENSIVE METABOLIC PANEL
ALT: 10 U/L (ref 0–53)
AST: 11 U/L (ref 0–37)
Alkaline Phosphatase: 67 U/L (ref 39–117)
BUN: 13 mg/dL (ref 6–23)
Calcium: 9.3 mg/dL (ref 8.4–10.5)
Creat: 1.16 mg/dL (ref 0.50–1.35)
Total Bilirubin: 0.7 mg/dL (ref 0.3–1.2)

## 2012-11-26 LAB — CBC WITH DIFFERENTIAL/PLATELET
Basophils Absolute: 0 10*3/uL (ref 0.0–0.1)
Basophils Relative: 0 % (ref 0–1)
Eosinophils Absolute: 0.1 10*3/uL (ref 0.0–0.7)
Eosinophils Relative: 2 % (ref 0–5)
HCT: 38 % — ABNORMAL LOW (ref 39.0–52.0)
Lymphocytes Relative: 32 % (ref 12–46)
MCHC: 33.2 g/dL (ref 30.0–36.0)
MCV: 81 fL (ref 78.0–100.0)
Monocytes Absolute: 0.2 10*3/uL (ref 0.1–1.0)
Platelets: 252 10*3/uL (ref 150–400)
RDW: 15.6 % — ABNORMAL HIGH (ref 11.5–15.5)
WBC: 5 10*3/uL (ref 4.0–10.5)

## 2012-11-26 LAB — IRON AND TIBC
%SAT: 22 % (ref 20–55)
TIBC: 418 ug/dL (ref 215–435)

## 2012-11-26 LAB — MAGNESIUM: Magnesium: 2.3 mg/dL (ref 1.5–2.5)

## 2012-11-26 MED ORDER — METHOCARBAMOL 500 MG PO TABS: 500.0000 mg | ORAL_TABLET | Freq: Four times a day (QID) | ORAL | Status: DC

## 2012-11-26 NOTE — Progress Notes (Signed)
Patient ID: BRYOR RAMI, male   DOB: November 21, 1950, 62 y.o.   MRN: [id number]  CC: To establish care  HPI: Mr. Cullens is a 62 years old gentleman who came to clinic today to establish care. He has extensive medical history including morbid obesity status post gastric bypass, chronic back pain, arthritis, peptic ulcer disease, depression, on so many medications as listed below. His major concern today is the low back pain for which she wants oxycodone prescribed. He claims to have been on oxycodone for a long time but would not tell me who has been prescribing them. He claims to be doing well with his weight loss following gastric bypass, he has lost about 45 pounds so far. Denies chest pain, no headache, no abdominal pain. He tolerates by mouth intake, with no significant change in bowel habit.  No Known Allergies Past Medical History  Diagnosis Date  . Acute kidney failure, unspecified   . Allergic rhinitis, cause unspecified   . Other chronic pain   . Unspecified pruritic disorder   . Hordeolum externum     Stye in R lower eyelid  . Obesity, Class III, BMI 40-49.9 (morbid obesity)   . Arthritis   . Chronic back pain   . GERD (gastroesophageal reflux disease)     takes Protonix daily  . History of gastric ulcer     x2  . GI bleeding 1989  . History of blood transfusion     no abnormal reaction noted  . Depressive disorder, not elsewhere classified     takes Cymbalta and Abilify daily   Current Outpatient Prescriptions on File Prior to Visit  Medication Sig Dispense Refill  . ARIPiprazole (ABILIFY) 2 MG tablet Take 3 mg by mouth every evening.       . DULoxetine (CYMBALTA) 60 MG capsule Take 60 mg by mouth 2 (two) times daily.       . Multiple Vitamin (MULTIVITAMIN WITH MINERALS) TABS Take 1 tablet by mouth daily.      . OxyCODONE HCl ER (OXYCONTIN) 30 MG T12A Take 30 mg by mouth 3 (three) times daily.      . diclofenac sodium (VOLTAREN) 1 % GEL Apply 2 g topically 4 (four) times  daily as needed (pain). Apply to back       No current facility-administered medications on file prior to visit.   Family History  Problem Relation Age of Onset  . Asthma Other   . Depression Other   . Diabetes Other   . Heart disease Other   . Hypertension Other   . Stroke Other   . Stroke Father    History   Social History  . Marital Status: Married    Spouse Name: N/A    Number of Children: N/A  . Years of Education: N/A   Occupational History  . Not on file.   Social History Main Topics  . Smoking status: Never Smoker   . Smokeless tobacco: Never Used  . Alcohol Use: Yes     Comment: rare  . Drug Use: No  . Sexual Activity: Not on file   Other Topics Concern  . Not on file   Social History Narrative   Has been on disability since 2006   Never smoker or drinker   Curtrently lives in [place] with wife    Review of Systems: Constitutional: Negative for fever, chills, diaphoresis, activity change, appetite change and fatigue. HENT: Negative for ear pain, nosebleeds, congestion, facial swelling, rhinorrhea, neck pain,  neck stiffness and ear discharge.  Eyes: Negative for pain, discharge, redness, itching and visual disturbance. Respiratory: Negative for cough, choking, chest tightness, shortness of breath, wheezing and stridor.  Cardiovascular: Negative for chest pain, palpitations and leg swelling. Gastrointestinal: Negative for abdominal distention. Genitourinary: Negative for dysuria, urgency, frequency, hematuria, flank pain, decreased urine volume, difficulty urinating and dyspareunia.  Musculoskeletal: Negative for back pain, joint swelling, arthralgias and gait problem. Neurological: Negative for dizziness, tremors, seizures, syncope, facial asymmetry, speech difficulty, weakness, light-headedness, numbness and headaches.  Hematological: Negative for adenopathy. Does not bruise/bleed easily. Psychiatric/Behavioral: Negative for hallucinations,  behavioral problems, confusion, dysphoric mood, decreased concentration and agitation.    Objective:   Filed Vitals:   11/26/12 1446  BP: 150/90  Pulse: 48  Temp: 98.6 F (37 C)  Resp: 18    Physical Exam: Constitutional: Patient appears well-developed and well-nourished. No distress. Morbidly Obese HENT: Normocephalic, atraumatic, External right and left ear normal. Oropharynx is clear and moist.  Eyes: Conjunctivae and EOM are normal. PERRLA, no scleral icterus. Neck: Normal ROM. Neck supple. No JVD. No tracheal deviation. No thyromegaly. CVS: RRR, S1/S2 +, no murmurs, no gallops, no carotid bruit.  Pulmonary: Effort and breath sounds normal, no stridor, rhonchi, wheezes, rales.  Abdominal: Soft. BS +,  no distension, tenderness, rebound or guarding.  Musculoskeletal: Normal range of motion. No edema and no tenderness.  Lymphadenopathy: No lymphadenopathy noted, cervical, inguinal or axillary Neuro: Alert. Normal reflexes, muscle tone coordination. No cranial nerve deficit. Skin: Skin is warm and dry. No rash noted. Not diaphoretic. No erythema. No pallor. Psychiatric: Normal mood and affect. Behavior, judgment, thought content normal.  Lab Results  Component Value Date   WBC 5.6 10/05/2012   HGB 11.8* 10/05/2012   HCT 36.7* 10/05/2012   MCV 85.5 10/05/2012   PLT 256 10/05/2012   Lab Results  Component Value Date   CREATININE 0.93 08/18/2012   BUN 16 08/18/2012   NA 137 08/18/2012   K 4.3 08/18/2012   CL 103 08/18/2012   CO2 25 08/18/2012    Lab Results  Component Value Date   HGBA1C 6.1* 08/11/2012   Lipid Panel     Component Value Date/Time   CHOL 259* 03/01/2012 1700   TRIG 213* 03/01/2012 1700   HDL 42 03/01/2012 1700   CHOLHDL 6.2 03/01/2012 1700   VLDL 43* 03/01/2012 1700   LDLCALC 174* 03/01/2012 1700       Assessment and plan:   Patient Active Problem List   Diagnosis Date Noted  . Low back pain 11/26/2012  . Morbid obesity 11/26/2012  . Depression 11/26/2012   . HTN (hypertension) 08/11/2012  . 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 Biagio Quint 08/11/2012   Robaxin 500 mg tablet by mouth 4 times a day Ambulatory referral to pain clinic Patient counseled extensively about pain in the use of oxycodone We discussed the clinic's policy on the narcotics Extensive counseling about nutrition and exercise  Maurine Minister L Nikolov was given clear instructions to go to ER or return to the clinic if symptoms don't improve, worsen or new problems develop.  Donnalee Curry Simone verbalized understanding.  Donnalee Curry Lege was told to call to get lab results if hasn't heard anything in the next week.        Jeanann Lewandowsky, MD [facility] [place], [place] [phone]   11/26/2012, 3:03 PM

## 2012-11-26 NOTE — Progress Notes (Signed)
Pt here to establish care Hx HTN, S/P GASTRIC BYPASS 08/10/12 C/O CHRONIC BACK RADIATING TO HIP/KNEES X 7 MNTHS

## 2012-11-27 LAB — VITAMIN B12: Vitamin B-12: 477 pg/mL (ref 211–911)

## 2012-11-27 LAB — FOLATE: Folate: 18.5 ng/mL

## 2012-12-01 LAB — VITAMIN B1: Vitamin B1 (Thiamine): 16 nmol/L (ref 8–30)

## 2013-02-03 ENCOUNTER — Other Ambulatory Visit: Payer: Self-pay

## 2013-02-09 ENCOUNTER — Emergency Department (HOSPITAL_COMMUNITY): Payer: Medicare Other

## 2013-02-09 ENCOUNTER — Emergency Department (HOSPITAL_COMMUNITY)
Admission: EM | Admit: 2013-02-09 | Discharge: 2013-02-09 | Disposition: A | Discharge status: Home / Self Care | Payer: Medicare Other | Attending: Emergency Medicine | Admitting: Emergency Medicine

## 2013-02-09 ENCOUNTER — Encounter (HOSPITAL_COMMUNITY): Payer: Self-pay | Admitting: Emergency Medicine

## 2013-02-09 DIAGNOSIS — R339 Retention of urine, unspecified: Secondary | ICD-10-CM | POA: Insufficient documentation

## 2013-02-09 DIAGNOSIS — K8020 Calculus of gallbladder without cholecystitis without obstruction: Secondary | ICD-10-CM

## 2013-02-09 DIAGNOSIS — Z8669 Personal history of other diseases of the nervous system and sense organs: Secondary | ICD-10-CM | POA: Insufficient documentation

## 2013-02-09 DIAGNOSIS — G8929 Other chronic pain: Secondary | ICD-10-CM | POA: Insufficient documentation

## 2013-02-09 DIAGNOSIS — Z87448 Personal history of other diseases of urinary system: Secondary | ICD-10-CM | POA: Insufficient documentation

## 2013-02-09 DIAGNOSIS — Z8711 Personal history of peptic ulcer disease: Secondary | ICD-10-CM | POA: Insufficient documentation

## 2013-02-09 DIAGNOSIS — M129 Arthropathy, unspecified: Secondary | ICD-10-CM | POA: Insufficient documentation

## 2013-02-09 DIAGNOSIS — R3915 Urgency of urination: Secondary | ICD-10-CM | POA: Insufficient documentation

## 2013-02-09 DIAGNOSIS — F3289 Other specified depressive episodes: Secondary | ICD-10-CM | POA: Insufficient documentation

## 2013-02-09 DIAGNOSIS — M549 Dorsalgia, unspecified: Secondary | ICD-10-CM | POA: Insufficient documentation

## 2013-02-09 DIAGNOSIS — F329 Major depressive disorder, single episode, unspecified: Secondary | ICD-10-CM | POA: Insufficient documentation

## 2013-02-09 DIAGNOSIS — Z79899 Other long term (current) drug therapy: Secondary | ICD-10-CM | POA: Insufficient documentation

## 2013-02-09 DIAGNOSIS — R300 Dysuria: Secondary | ICD-10-CM | POA: Insufficient documentation

## 2013-02-09 DIAGNOSIS — Z872 Personal history of diseases of the skin and subcutaneous tissue: Secondary | ICD-10-CM | POA: Insufficient documentation

## 2013-02-09 DIAGNOSIS — K219 Gastro-esophageal reflux disease without esophagitis: Secondary | ICD-10-CM | POA: Insufficient documentation

## 2013-02-09 LAB — BASIC METABOLIC PANEL
Calcium: 9.5 mg/dL (ref 8.4–10.5)
Creatinine, Ser: 0.89 mg/dL (ref 0.50–1.35)
GFR calc Af Amer: >90 mL/min (ref >90)
GFR calc non Af Amer: >90 mL/min (ref >90)
Glucose, Bld: 117 mg/dL — ABNORMAL HIGH (ref 70–99)
Sodium: 136 mEq/L (ref 135–145)

## 2013-02-09 LAB — HEPATIC FUNCTION PANEL
AST: 12 U/L (ref 0–37)
Albumin: 3.6 g/dL (ref 3.5–5.2)
Total Bilirubin: 0.5 mg/dL (ref 0.3–1.2)

## 2013-02-09 LAB — CBC
MCH: 27.7 pg (ref 26.0–34.0)
MCHC: 33.6 g/dL (ref 30.0–36.0)
Platelets: 264 10*3/uL (ref 150–400)

## 2013-02-09 LAB — URINALYSIS, ROUTINE W REFLEX MICROSCOPIC
Bilirubin Urine: NEGATIVE
Ketones, ur: NEGATIVE mg/dL
Nitrite: NEGATIVE
Urobilinogen, UA: 1 mg/dL (ref 0.0–1.0)

## 2013-02-09 MED ORDER — IOHEXOL 300 MG/ML  SOLN
50.0000 mL | Freq: Once | INTRAMUSCULAR | Status: AC | PRN
  Administered 2013-02-09: 50 mL via ORAL

## 2013-02-09 MED ORDER — HYDROMORPHONE HCL PF 1 MG/ML IJ SOLN
1.0000 mg | Freq: Once | INTRAMUSCULAR | Status: AC
  Administered 2013-02-09: 1 mg via INTRAVENOUS
  Filled 2013-02-09: qty 1

## 2013-02-09 MED ORDER — IOHEXOL 300 MG/ML  SOLN
100.0000 mL | Freq: Once | INTRAMUSCULAR | Status: AC | PRN
  Administered 2013-02-09: 100 mL via INTRAVENOUS

## 2013-02-09 NOTE — ED Notes (Signed)
Pt states he has not peed in 3 days. States he has pain in his upper back and groin since Sunday. Pt has a history of a benign left kidney tumor.

## 2013-02-09 NOTE — ED Provider Notes (Signed)
Patient here with urinary retention. Last void was 10 AM this morning. Initial bladder scan shows 0 cc. Patient did have right upper quadrant abdominal pain a CT scan initially shows evidence of cholelithiasis. Abdominal ultrasound performed showing evidence of cholelithiasis without evidence of cholecystitis. Patient able to tolerates by mouth at this time and does have urge to urinate. Once patient able to urinate will discharge to have close followup with his PCP.  6:37 PM Patient able to urinate without any difficulty. Patient felt better and requests to be discharged. Patient able to tolerates by mouth as well. He is stable for discharge. Return precautions discussed.  BP 153/92  Pulse 59  Temp(Src) 98.5 F (36.9 C) (Oral)  Resp 16  SpO2 97%  I have reviewed nursing notes and vital signs. I personally reviewed the imaging tests through PACS system  I reviewed available ER/hospitalization records thought the EMR  Results for orders placed during the hospital encounter of 02/09/13  URINALYSIS, ROUTINE W REFLEX MICROSCOPIC      Result Value Range   Color, Urine YELLOW  YELLOW   APPearance CLOUDY (*) CLEAR   Specific Gravity, Urine 1.024  1.005 - 1.030   pH 7.0  5.0 - 8.0   Glucose, UA NEGATIVE  NEGATIVE mg/dL   Hgb urine dipstick NEGATIVE  NEGATIVE   Bilirubin Urine NEGATIVE  NEGATIVE   Ketones, ur NEGATIVE  NEGATIVE mg/dL   Protein, ur NEGATIVE  NEGATIVE mg/dL   Urobilinogen, UA 1.0  0.0 - 1.0 mg/dL   Nitrite NEGATIVE  NEGATIVE   Leukocytes, UA NEGATIVE  NEGATIVE  CBC      Result Value Range   WBC 7.3  4.0 - 10.5 K/uL   RBC 5.20  4.22 - 5.81 MIL/uL   Hemoglobin 14.4  13.0 - 17.0 g/dL   HCT 62.1  30.8 - 65.7 %   MCV 82.5  78.0 - 100.0 fL   MCH 27.7  26.0 - 34.0 pg   MCHC 33.6  30.0 - 36.0 g/dL   RDW 84.6  96.2 - 95.2 %   Platelets 264  150 - 400 K/uL  BASIC METABOLIC PANEL      Result Value Range   Sodium 136  135 - 145 mEq/L   Potassium 3.9  3.5 - 5.1 mEq/L   Chloride  102  96 - 112 mEq/L   CO2 23  19 - 32 mEq/L   Glucose, Bld 117 (*) 70 - 99 mg/dL   BUN 14  6 - 23 mg/dL   Creatinine, Ser 8.41  0.50 - 1.35 mg/dL   Calcium 9.5  8.4 - 32.4 mg/dL   GFR calc non Af Amer >90  >90 mL/min   GFR calc Af Amer >90  >90 mL/min  HEPATIC FUNCTION PANEL      Result Value Range   Total Protein 7.2  6.0 - 8.3 g/dL   Albumin 3.6  3.5 - 5.2 g/dL   AST 12  0 - 37 U/L   ALT 11  0 - 53 U/L   Alkaline Phosphatase 76  39 - 117 U/L   Total Bilirubin 0.5  0.3 - 1.2 mg/dL   Bilirubin, Direct <4.0  0.0 - 0.3 mg/dL   Indirect Bilirubin NOT CALCULATED  0.3 - 0.9 mg/dL   US Abdomen Complete  02/09/2013   CLINICAL DATA:  Urinary retention.  EXAM: ULTRASOUND ABDOMEN COMPLETE  COMPARISON:  CT obtained earlier today.  FINDINGS: Gallbladder  Large gallstone in the gallbladder neck measuring of 4.1  cm in maximum diameter. No gallbladder wall thickening or pericholecystic fluid. The patient was not tender over the gallbladder.  Common bile duct  Diameter: 3.9 mm  Liver  1.1 cm right lobe liver cyst.  Poorly visualized left lobe.  IVC  Not visualized.  Pancreas  Visualized portion unremarkable.  Spleen  Size and appearance within normal limits.  Right Kidney  Length: 11.8 cm. 3.2 x 0.8 x 2.8 cm oval, homogeneously echogenic mass corresponding to the angiomyolipoma seen on the CT. Otherwise, normal.  Left Kidney  Length: 11.9 cm. Echogenicity within normal limits. No mass or hydronephrosis visualized.  Abdominal aorta  Partially obscured by overlying bowel gas. The visualized portions are normal in caliber. No aneurysm on the CT.  IMPRESSION: 1. Cholelithiasis. 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 3. The 1.5 cm upper pole right renal mass seen on the CT was not visualized by ultrasound today and neither was the 1.1 cm enhancing lesion in the dome of the liver.   Electronically Signed   By: Gordan Payment M.D.   On: 02/09/2013 17:24   Ct Abdomen Pelvis W Contrast  02/09/2013   CLINICAL DATA:   History of renal tumor. Oligomenorrhea. Upper back pain. Groin pain.  EXAM: CT ABDOMEN AND PELVIS WITH CONTRAST  TECHNIQUE: Multidetector CT imaging of the abdomen and pelvis was performed using the standard protocol following bolus administration of intravenous contrast.  CONTRAST:  OMNIPAQUE IOHEXOL 300 MG/ML  SOLN  COMPARISON:  08/18/2012  FINDINGS: Multiple liver lesions are not significantly changed in comparison to all the prior studies. There is an intensely enhancing lesion at the dome on image 11 measuring 9 mm. A hypodensity in the right lobe on image 20. Other smaller lesions are noted.  A large gallstone or sludge ball is stable.  15 mm enhancing lesion at the upper pole of the medial right kidney is stable.  Angioma lipoma in the medial interpolar region of the right kidney is stable measuring 2.7 cm. Bilateral nephrolithiasis is stable.  Large umbilical hernia containing adipose tissue is stable.  Right inguinal hernia containing adipose tissue is stable.  Pancreas, spleen, adrenal glands are within normal limits.  Postoperative changes along the greater curvature of the stomach are noted. No evidence of fluid collection or extraluminal bowel gas.  No evidence of free fluid, adenopathy, retroperitoneal hemorrhage. No definite ureteral calculi.  No vertebral compression deformity. Sclerotic changes in the S1 vertebral body are changes and of unknown significance. L3 hemangioma is stable. Small L5 M angioma is stable.  IMPRESSION: Cholelithiasis.  Stable liver lesions supporting benign etiology.  Stable enhancing 1.5 cm mass at in the upper pole of the right kidney worrisome for renal cell carcinoma.  Postop changes in the stomach without obvious complication.  Stable umbilical hernia containing adipose tissue.  Stable nephrolithiasis.   Electronically Signed   By: Maryclare Bean M.D.   On: 02/09/2013 14:17      Fayrene Helper, PA-C 02/09/13 1838

## 2013-02-09 NOTE — ED Provider Notes (Signed)
CSN: [id number]     Arrival date & time 02/09/13  1204 History   First MD Initiated Contact with Patient 02/09/13 1210     Chief Complaint  Patient presents with  . Urinary Retention   (Consider location/radiation/quality/duration/timing/severity/associated sxs/prior Treatment) HPI Comments: Patient is a 62 year old male with a past medical history of renal cell carcinoma, acute renal failure, chronic back pain, GERD along with an extensive surgical history including laparoscopic gastric sleeve resection who presents to the ED complaining of 3 days of urinary retention. States he has had small dribbles of urine, however no actual relief of his bladder. Has the sense and urge to urinate, however cannot. Over the passed two days he began to develop upper back pain worse on the right radiating around his right flank into his groin described as a throbbing pressure. In June he had an MRI of his abdomen showing a left renal cell carcinoma and right renal angiomyolipoma. Last saw urology at [facility] one month ago. Denies ever having urinary retention like this in the past. The only time he has had to be catheterized as after his prior surgeries. Denies fever, chills, nausea or vomiting, testicular pain or swelling.      The history is provided by the patient.    Past Medical History  Diagnosis Date  . Acute kidney failure, unspecified   . Allergic rhinitis, cause unspecified   . Other chronic pain   . Unspecified pruritic disorder   . Hordeolum externum     Stye in R lower eyelid  . Obesity, Class III, BMI 40-49.9 (morbid obesity)   . Arthritis   . Chronic back pain   . GERD (gastroesophageal reflux disease)     takes Protonix daily  . History of gastric ulcer     x2  . GI bleeding 1989  . History of blood transfusion     no abnormal reaction noted  . Depressive disorder, not elsewhere classified     takes Cymbalta and Abilify daily   Past Surgical History  Procedure  Laterality Date  . Ankle surgery Left   . Back surgery    . Hand surgery Left   . Leg surgery Bilateral   . 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 Left testcle removed    . Vasectomy    . Tonsillectomy      as a child  . Tibia fracture surgery      multiple  . Spinal cord stimulator placed    . Spinal cord stimulator removed    . Nasal sinus surgery  03/26/2012    with fusion, x3  . Sinus endo w/fusion  03/26/2012    Procedure: ENDOSCOPIC SINUS SURGERY WITH FUSION NAVIGATION;  Surgeon: Melvenia Beam, MD;  Location: [facility] OR;  Service: ENT;  Laterality: N/A;  LEFT ETHMOIDECTOMY; BILATERAL SPHENOIDECTOMY; LEFT MAXILLARY ANTROSTOMY WITH IMAGE GUIDANCE; REPAIR LEFT SPHENOID CSF LEAK; NASAL SEPTAL FLAP  . Abdominal fat graph  03/26/2012    Procedure: ABDOMINAL FAT GRAPH;  Surgeon: Melvenia Beam, MD;  Location: [facility] OR;  Service: ENT;  Laterality: N/A;  . Esophagogastroduodenoscopy N/A 05/27/2012    Procedure: ESOPHAGOGASTRODUODENOSCOPY (EGD);  Surgeon: Kandis Cocking, MD;  Location: [facility];  Service: General;  Laterality: N/A;  . Fracture surgery      left leg as child  . Laparoscopic gastric sleeve resection N/A 08/10/2012    Procedure: LAPAROSCOPIC GASTRIC SLEEVE RESECTION;  Surgeon: [name], DO;  Location: [facility] ORS;  Service:  General;  Laterality: N/A;  . Esophagogastroduodenoscopy N/A 08/10/2012    Procedure: ESOPHAGOGASTRODUODENOSCOPY (EGD);  Surgeon: [name], DO;  Location: [facility] ORS;  Service: General;  Laterality: N/A;   Family History  Problem Relation Age of Onset  . Asthma Other   . Depression Other   . Diabetes Other   . Heart disease Other   . Hypertension Other   . Stroke Other   . Stroke Father    History  Substance Use Topics  . Smoking status: Never Smoker   . Smokeless tobacco: Never Used  . Alcohol Use: Yes     Comment: rare    Review of Systems  Genitourinary: Positive for urgency and difficulty urinating.  All other systems reviewed and are  negative.    Allergies  Review of patient's allergies indicates no known allergies.  Home Medications   Current Outpatient Rx  Name  Route  Sig  Dispense  Refill  . diclofenac sodium (VOLTAREN) 1 % GEL   Topical   Apply 2 g topically 4 (four) times daily as needed (pain). Apply to back         . ibuprofen (ADVIL,MOTRIN) 200 MG tablet   Oral   Take 800 mg by mouth every 8 (eight) hours as needed for mild pain.         . Multiple Vitamin (MULTIVITAMIN WITH MINERALS) TABS   Oral   Take 1 tablet by mouth daily.         . OxyCODONE HCl ER (OXYCONTIN) 30 MG T12A   Oral   Take 30 mg by mouth 5 (five) times daily as needed.           BP 165/113  Pulse 64  Temp(Src) 97.8 F (36.6 C) (Oral)  Resp 16  SpO2 100% Physical Exam  Nursing note and vitals reviewed. Constitutional: He is oriented to person, place, and time. He appears well-developed and well-nourished. No distress.  HENT:  Head: Normocephalic and atraumatic.  Mouth/Throat: Oropharynx is clear and moist.  Eyes: Conjunctivae are normal.  Neck: Normal range of motion. Neck supple.  Cardiovascular: Normal rate, regular rhythm and normal heart sounds.   Pulmonary/Chest: Effort normal and breath sounds normal.  Abdominal: Soft. Normal appearance and bowel sounds are normal. He exhibits no distension and no mass. There is tenderness (worse RLQ and suprapubic) in the right upper quadrant, right lower quadrant and suprapubic area. There is CVA tenderness (right). There is no rigidity, no rebound, no guarding and negative Murphy's sign. A hernia (umbilical, soft, non-tender, reducible) is present. Hernia confirmed negative in the right inguinal area and confirmed negative in the left inguinal area.  No peritoneal signs.  Genitourinary: Penis normal. Rectal exam shows no tenderness and anal tone normal. Prostate is not tender. Right testis shows no mass, no swelling and no tenderness. No penile tenderness.  L teste  surgically absent.  Musculoskeletal: Normal range of motion. He exhibits no edema.  Neurological: He is alert and oriented to person, place, and time.  Skin: Skin is warm and dry. He is not diaphoretic.  Psychiatric: He has a normal mood and affect. His behavior is normal.    ED Course  Procedures (including critical care time) Labs Review Labs Reviewed  URINALYSIS, ROUTINE W REFLEX MICROSCOPIC - Abnormal; Notable for the following:    APPearance CLOUDY (*)    All other components within normal limits  BASIC METABOLIC PANEL - Abnormal; Notable for the following:    Glucose, Bld 117 (*)  All other components within normal limits  CBC  HEPATIC FUNCTION PANEL   Imaging Review Ct Abdomen Pelvis W Contrast  02/09/2013   CLINICAL DATA:  History of renal tumor. Oligomenorrhea. Upper back pain. Groin pain.  EXAM: CT ABDOMEN AND PELVIS WITH CONTRAST  TECHNIQUE: Multidetector CT imaging of the abdomen and pelvis was performed using the standard protocol following bolus administration of intravenous contrast.  CONTRAST:  OMNIPAQUE IOHEXOL 300 MG/ML  SOLN  COMPARISON:  08/18/2012  FINDINGS: Multiple liver lesions are not significantly changed in comparison to all the prior studies. There is an intensely enhancing lesion at the dome on image 11 measuring 9 mm. A hypodensity in the right lobe on image 20. Other smaller lesions are noted.  A large gallstone or sludge ball is stable.  15 mm enhancing lesion at the upper pole of the medial right kidney is stable.  Angioma lipoma in the medial interpolar region of the right kidney is stable measuring 2.7 cm. Bilateral nephrolithiasis is stable.  Large umbilical hernia containing adipose tissue is stable.  Right inguinal hernia containing adipose tissue is stable.  Pancreas, spleen, adrenal glands are within normal limits.  Postoperative changes along the greater curvature of the stomach are noted. No evidence of fluid collection or extraluminal bowel  gas.  No evidence of free fluid, adenopathy, retroperitoneal hemorrhage. No definite ureteral calculi.  No vertebral compression deformity. Sclerotic changes in the S1 vertebral body are changes and of unknown significance. L3 hemangioma is stable. Small L5 M angioma is stable.  IMPRESSION: Cholelithiasis.  Stable liver lesions supporting benign etiology.  Stable enhancing 1.5 cm mass at in the upper pole of the right kidney worrisome for renal cell carcinoma.  Postop changes in the stomach without obvious complication.  Stable umbilical hernia containing adipose tissue.  Stable nephrolithiasis.   Electronically Signed   By: Maryclare Bean M.D.   On: 02/09/2013 14:17    EKG Interpretation   None       MDM  No diagnosis found.  Patient with urinary retention. Bladder scan done showing 0 CC. 75 CC expressed after in and out cath. Hx of kidney lesions, RCC. States umbilical hernia has been present for years, no pain. Refusing pain medication at this time. He appears in NAD. Labs pending- cbc, bmp, ua. CT abd/pelvis. 3:40 PM Labs WNL, UA clear. CT showing cholelithiasis, otherwise stable findings. He has requested pain medication as his pain increased. On re-examination, he is moreso tender on the right side but non-specific RUQ vs LLQ, negative Murphy's. Will obtain abdominal US to further evaluate gallbladder, check LFTs. Pending [place] results and LFTs, plan to discharge home as long as he can urinate spontaneously and tolerate PO. No N/V. F/u with surgery outpatient along with urology. Patient discussed with Fayrene Helper, PA-C at shift change who will take over care of patient.  Trevor Mace, PA-C 02/09/13 1540  Trevor Mace, PA-C 02/10/13 [phone]

## 2013-02-10 ENCOUNTER — Ambulatory Visit: Payer: Medicare Other | Admitting: *Deleted

## 2013-02-11 NOTE — ED Provider Notes (Signed)
Medical screening examination/treatment/procedure(s) were conducted as a shared visit with non-physician practitioner(s) and myself.  I personally evaluated the patient during the encounter.  EKG Interpretation   None         Roney Marion, MD 02/11/13 0700

## 2013-02-14 NOTE — ED Provider Notes (Signed)
Medical screening examination/treatment/procedure(s) were performed by non-physician practitioner and as supervising physician I was immediately available for consultation/collaboration.   Hurman Horn, MD 02/14/13 [phone]

## 2013-03-07 ENCOUNTER — Ambulatory Visit: Payer: Medicare Other | Admitting: Dietician

## 2013-04-04 DIAGNOSIS — F339 Major depressive disorder, recurrent, unspecified: Secondary | ICD-10-CM | POA: Insufficient documentation

## 2013-06-17 ENCOUNTER — Emergency Department (HOSPITAL_COMMUNITY): Payer: Medicare HMO

## 2013-06-17 ENCOUNTER — Encounter (HOSPITAL_COMMUNITY): Payer: Self-pay | Admitting: Emergency Medicine

## 2013-06-17 ENCOUNTER — Emergency Department (HOSPITAL_COMMUNITY)
Admission: EM | Admit: 2013-06-17 | Discharge: 2013-06-17 | Disposition: A | Discharge status: Home / Self Care | Payer: Medicare HMO | Attending: Emergency Medicine | Admitting: Emergency Medicine

## 2013-06-17 DIAGNOSIS — Z8669 Personal history of other diseases of the nervous system and sense organs: Secondary | ICD-10-CM | POA: Insufficient documentation

## 2013-06-17 DIAGNOSIS — N23 Unspecified renal colic: Secondary | ICD-10-CM

## 2013-06-17 DIAGNOSIS — M129 Arthropathy, unspecified: Secondary | ICD-10-CM | POA: Insufficient documentation

## 2013-06-17 DIAGNOSIS — Z9884 Bariatric surgery status: Secondary | ICD-10-CM | POA: Insufficient documentation

## 2013-06-17 DIAGNOSIS — F3289 Other specified depressive episodes: Secondary | ICD-10-CM | POA: Insufficient documentation

## 2013-06-17 DIAGNOSIS — Z87448 Personal history of other diseases of urinary system: Secondary | ICD-10-CM | POA: Insufficient documentation

## 2013-06-17 DIAGNOSIS — Z791 Long term (current) use of non-steroidal anti-inflammatories (NSAID): Secondary | ICD-10-CM | POA: Insufficient documentation

## 2013-06-17 DIAGNOSIS — G8929 Other chronic pain: Secondary | ICD-10-CM | POA: Insufficient documentation

## 2013-06-17 DIAGNOSIS — K219 Gastro-esophageal reflux disease without esophagitis: Secondary | ICD-10-CM | POA: Insufficient documentation

## 2013-06-17 DIAGNOSIS — N201 Calculus of ureter: Secondary | ICD-10-CM | POA: Insufficient documentation

## 2013-06-17 DIAGNOSIS — N200 Calculus of kidney: Secondary | ICD-10-CM | POA: Insufficient documentation

## 2013-06-17 DIAGNOSIS — Z79899 Other long term (current) drug therapy: Secondary | ICD-10-CM | POA: Insufficient documentation

## 2013-06-17 DIAGNOSIS — F329 Major depressive disorder, single episode, unspecified: Secondary | ICD-10-CM | POA: Insufficient documentation

## 2013-06-17 DIAGNOSIS — Z9889 Other specified postprocedural states: Secondary | ICD-10-CM | POA: Insufficient documentation

## 2013-06-17 LAB — URINALYSIS W MICROSCOPIC (NOT AT ARMC)
BILIRUBIN URINE: NEGATIVE
Glucose, UA: NEGATIVE mg/dL
KETONES UR: NEGATIVE mg/dL
Leukocytes, UA: NEGATIVE
Nitrite: NEGATIVE
Protein, ur: NEGATIVE mg/dL
SPECIFIC GRAVITY, URINE: 1.02 (ref 1.005–1.030)
UROBILINOGEN UA: 0.2 mg/dL (ref 0.0–1.0)
pH: 5.5 (ref 5.0–8.0)

## 2013-06-17 LAB — BASIC METABOLIC PANEL
BUN: 13 mg/dL (ref 6–23)
CO2: 23 mEq/L (ref 19–32)
CREATININE: 0.87 mg/dL (ref 0.50–1.35)
Calcium: 9.1 mg/dL (ref 8.4–10.5)
Chloride: 103 mEq/L (ref 96–112)
GFR calc Af Amer: >90 mL/min (ref >90)
GLUCOSE: 99 mg/dL (ref 70–99)
Potassium: 4.4 mEq/L (ref 3.7–5.3)
SODIUM: 140 meq/L (ref 137–147)

## 2013-06-17 MED ORDER — KETOROLAC TROMETHAMINE 60 MG/2ML IM SOLN
60.0000 mg | Freq: Once | INTRAMUSCULAR | Status: AC
  Administered 2013-06-17: 60 mg via INTRAMUSCULAR
  Filled 2013-06-17: qty 2

## 2013-06-17 MED ORDER — HYDROMORPHONE HCL PF 1 MG/ML IJ SOLN: 1.0000 mg | Freq: Once | INTRAMUSCULAR | Status: DC

## 2013-06-17 MED ORDER — OXYCODONE-ACETAMINOPHEN 5-325 MG PO TABS
2.0000 | ORAL_TABLET | Freq: Once | ORAL | Status: AC
  Administered 2013-06-17: 2 via ORAL
  Filled 2013-06-17: qty 2

## 2013-06-17 MED ORDER — HYDROMORPHONE HCL PF 1 MG/ML IJ SOLN
1.0000 mg | Freq: Once | INTRAMUSCULAR | Status: AC
Start: 2013-06-17 — End: 2013-06-17
  Administered 2013-06-17: 1 mg via INTRAMUSCULAR
  Filled 2013-06-17: qty 1

## 2013-06-17 MED ORDER — NAPROXEN 500 MG PO TABS: 500.0000 mg | ORAL_TABLET | Freq: Two times a day (BID) | ORAL | Status: DC

## 2013-06-17 MED ORDER — TAMSULOSIN HCL 0.4 MG PO CAPS: 0.4000 mg | ORAL_CAPSULE | Freq: Two times a day (BID) | ORAL | Status: DC

## 2013-06-17 MED ORDER — ONDANSETRON 4 MG PO TBDP: 4.0000 mg | ORAL_TABLET | Freq: Three times a day (TID) | ORAL | Status: DC | PRN

## 2013-06-17 NOTE — ED Provider Notes (Signed)
Patient received from Dr. Noemi Chapel at shift change.   63 yo male with hx of chronic LBP and hx of kidney stones presents with c/o Left LBP.  Patient hypertensive on presentation to 175/103. BP normalized with pain control to 145/83.   UA shows trace Hgb  BMP WNL CT shows: 1.  4 mm stone regional to the left UVJ with associated mild upstream ureterectasis and pelvicaliectasis. 2. Additional bilateral nonobstructing nephrolithiasis.  3. 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 4. Similar appearance of the 1.2 cm hyper attenuating right-sided renal lesion, a biopsy proven metanephric adenoma. 5. Post gastric sleeve surgery without evidence of enteric obstruction. 6. Cholelithiasis without evidence of cholecystitis. 7. Grossly unchanged right inguinal and periumbilical hernias.   Discussed labs, and exam findings with patient. Plan for follow up with Urology. Patient advised to return to the Emergency Department at any point if he feels his condition is worsening. Patient is currently on pain contract for chronic LBP. Patient advised to follow up with his pain doctor for any changes in his current pain medication regimen. Patient confirms understanding.   Meds given in ED:  Medications  ketorolac (TORADOL) injection 60 mg (60 mg Intramuscular Given 06/17/13 0642)  HYDROmorphone (DILAUDID) injection 1 mg (1 mg Intramuscular Given 06/17/13 0700)  oxyCODONE-acetaminophen (PERCOCET/ROXICET) 5-325 MG per tablet 2 tablet (2 tablets Oral Given 06/17/13 0810)    New Prescriptions   NAPROXEN (NAPROSYN) 500 MG TABLET    Take 1 tablet (500 mg total) by mouth 2 (two) times daily with a meal.   ONDANSETRON (ZOFRAN ODT) 4 MG DISINTEGRATING TABLET    Take 1 tablet (4 mg total) by mouth every 8 (eight) hours as needed for nausea.   TAMSULOSIN (FLOMAX) 0.4 MG CAPS CAPSULE    Take 1 capsule (0.4 mg total) by mouth 2 (two) times daily.        Dossie Arbour [place], PA-C 06/17/13 [phone]

## 2013-06-17 NOTE — ED Notes (Signed)
Pt complains of lower left sided back pain that woke him up this am, no injury noted

## 2013-06-17 NOTE — ED Provider Notes (Signed)
CSN: [id number]     Arrival date & time 06/17/13  0544 History   First MD Initiated Contact with Patient 06/17/13 [phone]     Chief Complaint  Patient presents with  . Back Pain     (Consider location/radiation/quality/duration/timing/severity/associated sxs/prior Treatment) HPI Comments: 63 year old male with a history of chronic low back pain and a history of occasional kidney stones presents with a complaint of left lower back pain. He states that he has daily back pain which is constant and almost all day long but is usually positional and related to his work. At 4:00 in the morning, 2 hours ago, he developed acute onset of sharp and stabbing left lower back pain which does not radiate, is sharp, does not get worse with position and does not improve with position. There is no associated nausea vomiting or diaphoresis he has not noticed any hematuria or dark-colored urine. He states this does not feel the same as his prior kidney stones but is also not the same as his chronic back pain. He denies any abdominal discomfort or groin discomfort.  Patient is a 63 y.o. male presenting with back pain. The history is provided by the patient.  Back Pain   Past Medical History  Diagnosis Date  . Acute kidney failure, unspecified   . Allergic rhinitis, cause unspecified   . Other chronic pain   . Unspecified pruritic disorder   . Hordeolum externum     Stye in R lower eyelid  . Obesity, Class III, BMI 40-49.9 (morbid obesity)   . Arthritis   . Chronic back pain   . GERD (gastroesophageal reflux disease)     takes Protonix daily  . History of gastric ulcer     x2  . GI bleeding 1989  . History of blood transfusion     no abnormal reaction noted  . Depressive disorder, not elsewhere classified     takes Cymbalta and Abilify daily   Past Surgical History  Procedure Laterality Date  . Ankle surgery Left   . Back surgery    . Hand surgery Left   . Leg surgery Bilateral   . 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 Left testcle removed    . Vasectomy    . Tonsillectomy      as a child  . Tibia fracture surgery      multiple  . Spinal cord stimulator placed    . Spinal cord stimulator removed    . Nasal sinus surgery  03/26/2012    with fusion, x3  . Sinus endo w/fusion  03/26/2012    Procedure: ENDOSCOPIC SINUS SURGERY WITH FUSION NAVIGATION;  Surgeon: Ruby Cola, MD;  Location: [facility] OR;  Service: ENT;  Laterality: N/A;  LEFT ETHMOIDECTOMY; BILATERAL SPHENOIDECTOMY; LEFT MAXILLARY ANTROSTOMY WITH IMAGE GUIDANCE; REPAIR LEFT SPHENOID CSF LEAK; NASAL SEPTAL FLAP  . Abdominal fat graph  03/26/2012    Procedure: ABDOMINAL FAT GRAPH;  Surgeon: Ruby Cola, MD;  Location: [facility] OR;  Service: ENT;  Laterality: N/A;  . Esophagogastroduodenoscopy N/A 05/27/2012    Procedure: ESOPHAGOGASTRODUODENOSCOPY (EGD);  Surgeon: Shann Medal, MD;  Location: [facility];  Service: General;  Laterality: N/A;  . Fracture surgery      left leg as child  . Laparoscopic gastric sleeve resection N/A 08/10/2012    Procedure: LAPAROSCOPIC GASTRIC SLEEVE RESECTION;  Surgeon: Madilyn Hook, DO;  Location: [facility] ORS;  Service: General;  Laterality: N/A;  . Esophagogastroduodenoscopy N/A 08/10/2012  Procedure: ESOPHAGOGASTRODUODENOSCOPY (EGD);  Surgeon: Madilyn Hook, DO;  Location: [facility] ORS;  Service: General;  Laterality: N/A;   Family History  Problem Relation Age of Onset  . Asthma Other   . Depression Other   . Diabetes Other   . Heart disease Other   . Hypertension Other   . Stroke Other   . Stroke Father    History  Substance Use Topics  . Smoking status: Never Smoker   . Smokeless tobacco: Never Used  . Alcohol Use: Yes     Comment: rare    Review of Systems  Musculoskeletal: Positive for back pain.  All other systems reviewed and are negative.      Allergies  Review of patient's allergies indicates no known allergies.  Home Medications   Current Outpatient Rx  Name  Route   Sig  Dispense  Refill  . ARIPiprazole (ABILIFY) 2 MG tablet   Oral   Take 2 mg by mouth daily.         . diclofenac sodium (VOLTAREN) 1 % GEL   Topical   Apply 2 g topically 4 (four) times daily.         . DULoxetine (CYMBALTA) 60 MG capsule   Oral   Take 60 mg by mouth daily.         . Multiple Vitamin (MULTIVITAMIN WITH MINERALS) TABS   Oral   Take 1 tablet by mouth daily.         . OxyCODONE HCl ER (OXYCONTIN) 30 MG T12A   Oral   Take 30 mg by mouth 6 (six) times daily. For pain          BP 175/103  Pulse 53  Temp(Src) 97.4 F (36.3 C) (Oral)  Resp 18  Ht 6' (1.829 m)  Wt 264 lb (119.75 kg)  BMI 35.80 kg/m2  SpO2 96% Physical Exam  Nursing note and vitals reviewed. Constitutional: He appears well-developed and well-nourished. No distress.  HENT:  Head: Normocephalic and atraumatic.  Mouth/Throat: Oropharynx is clear and moist. No oropharyngeal exudate.  Eyes: Conjunctivae and EOM are normal. Pupils are equal, round, and reactive to light. Right eye exhibits no discharge. Left eye exhibits no discharge. No scleral icterus.  Neck: Normal range of motion. Neck supple. No JVD present. No thyromegaly present.  Cardiovascular: Normal rate, regular rhythm, normal heart sounds and intact distal pulses.  Exam reveals no gallop and no friction rub.   No murmur heard. Pulmonary/Chest: Effort normal and breath sounds normal. No respiratory distress. He has no wheezes. He has no rales.  Abdominal: Soft. Bowel sounds are normal. He exhibits no distension and no mass. There is no tenderness.  No abdominal tenderness to palpation, no CVA tenderness  Musculoskeletal: Normal range of motion. He exhibits no edema and no tenderness.  No tenderness over the cervical thoracic or lumbar spines, no paraspinal tenderness to palpation  Lymphadenopathy:    He has no cervical adenopathy.  Neurological: He is alert. Coordination normal.   Gait is normal, strength and sensation of  the lower extremities normal  Skin: Skin is warm and dry. No rash noted. No erythema.  Psychiatric: He has a normal mood and affect. His behavior is normal.    ED Course  Procedures (including critical care time) Labs Review Labs Reviewed  URINALYSIS W MICROSCOPIC  BASIC METABOLIC PANEL   Imaging Review No results found.   EKG Interpretation None      MDM   Final diagnoses:  None  The patient has isolated left lower back pain. I cannot reproduce this on palpation, is not related to movement or position. I suspect a kidney stone as a possible etiology, urinalysis, check renal function as he does have a history of renal insufficiency and likely CT scan is no other source was found. Toradol ordered for pain  Emergency Focused Ultrasound Exam Limited retroperitoneal ultrasound of kidneys  Performed and interpreted by Dr. Reather Converse Indication: flank pain Focused abdominal ultrasound with both kidneys imaged in transverse and longitudinal planes in real-time. Interpretation: Mild hydronephrosis visualized on L.  No stones or cysts visualized on L , possible cysts on R Images archived electronically   Change of shift - care signed out to Dr. Darl Householder and [place] at 7 AM  Johnna Acosta, MD 06/17/13 [phone]

## 2013-06-17 NOTE — Discharge Instructions (Signed)
Make follow up appointment with Urology as listed above in follow up section. Take medications as directed. Do not drive or operate machinery while taking prescription pain medication. Please return to the Emergency Department at any point if you feel your condition is worsening. You will need to refer to your pain doctor about any changes in your pain medications due to your current pain contract.    Kidney Stones Kidney stones (urolithiasis) are deposits that form inside your kidneys. The intense pain is caused by the stone moving through the urinary tract. When the stone moves, the ureter goes into spasm around the stone. The stone is usually passed in the urine.  CAUSES   A disorder that makes certain neck glands produce too much parathyroid hormone (primary hyperparathyroidism).  A buildup of uric acid crystals, similar to gout in your joints.  Narrowing (stricture) of the ureter.  A kidney obstruction present at birth (congenital obstruction).  Previous surgery on the kidney or ureters.  Numerous kidney infections. SYMPTOMS   Feeling sick to your stomach (nauseous).  Throwing up (vomiting).  Blood in the urine (hematuria).  Pain that usually spreads (radiates) to the groin.  Frequency or urgency of urination. DIAGNOSIS   Taking a history and physical exam.  Blood or urine tests.  CT scan.  Occasionally, an examination of the inside of the urinary bladder (cystoscopy) is performed. TREATMENT   Observation.  Increasing your fluid intake.  Extracorporeal shock wave lithotripsy This is a noninvasive procedure that uses shock waves to break up kidney stones.  Surgery may be needed if you have severe pain or persistent obstruction. There are various surgical procedures. Most of the procedures are performed with the use of small instruments. Only small incisions are needed to accommodate these instruments, so recovery time is minimized. The size, location, and chemical  composition are all important variables that will determine the proper choice of action for you. Talk to your health care provider to better understand your situation so that you will minimize the risk of injury to yourself and your kidney.  HOME CARE INSTRUCTIONS   Drink enough water and fluids to keep your urine clear or pale yellow. This will help you to pass the stone or stone fragments.  Strain all urine through the provided strainer. Keep all particulate matter and stones for your health care provider to see. The stone causing the pain may be as small as a grain of salt. It is very important to use the strainer each and every time you pass your urine. The collection of your stone will allow your health care provider to analyze it and verify that a stone has actually passed. The stone analysis will often identify what you can do to reduce the incidence of recurrences.  Only take over-the-counter or prescription medicines for pain, discomfort, or fever as directed by your health care provider.  Make a follow-up appointment with your health care provider as directed.  Get follow-up X-rays if required. The absence of pain does not always mean that the stone has passed. It may have only stopped moving. If the urine remains completely obstructed, it can cause loss of kidney function or even complete destruction of the kidney. It is your responsibility to make sure X-rays and follow-ups are completed. Ultrasounds of the kidney can show blockages and the status of the kidney. Ultrasounds are not associated with any radiation and can be performed easily in a matter of minutes. SEEK MEDICAL CARE IF:  You experience pain  that is progressive and unresponsive to any pain medicine you have been prescribed. SEEK IMMEDIATE MEDICAL CARE IF:   Pain cannot be controlled with the prescribed medicine.  You have a fever or shaking chills.  The severity or intensity of pain increases over 18 hours and is not  relieved by pain medicine.  You develop a new onset of abdominal pain.  You feel faint or pass out.  You are unable to urinate. MAKE SURE YOU:   Understand these instructions.  Will watch your condition.  Will get help right away if you are not doing well or get worse. Document Released: 03/17/2005 Document Revised: 11/17/2012 Document Reviewed: 08/18/2012 [facility] Patient Information 2014 [place].

## 2013-06-18 NOTE — ED Provider Notes (Signed)
Medical screening examination/treatment/procedure(s) were conducted as a shared visit with non-physician practitioner(s) and myself.  I personally evaluated the patient during the encounter  Please see my separate respective documentation pertaining to this patient encounter   Johnna Acosta, MD 06/18/13 [phone]

## 2014-02-07 ENCOUNTER — Encounter (HOSPITAL_COMMUNITY): Payer: Self-pay | Admitting: Emergency Medicine

## 2014-02-07 ENCOUNTER — Emergency Department (HOSPITAL_COMMUNITY): Payer: Medicare HMO

## 2014-02-07 ENCOUNTER — Emergency Department (HOSPITAL_COMMUNITY)
Admission: EM | Admit: 2014-02-07 | Discharge: 2014-02-07 | Disposition: A | Discharge status: Home / Self Care | Payer: Medicare HMO | Attending: Emergency Medicine | Admitting: Emergency Medicine

## 2014-02-07 DIAGNOSIS — Z87442 Personal history of urinary calculi: Secondary | ICD-10-CM | POA: Diagnosis not present

## 2014-02-07 DIAGNOSIS — Z8709 Personal history of other diseases of the respiratory system: Secondary | ICD-10-CM | POA: Diagnosis not present

## 2014-02-07 DIAGNOSIS — F329 Major depressive disorder, single episode, unspecified: Secondary | ICD-10-CM | POA: Insufficient documentation

## 2014-02-07 DIAGNOSIS — Z87448 Personal history of other diseases of urinary system: Secondary | ICD-10-CM | POA: Diagnosis not present

## 2014-02-07 DIAGNOSIS — Z791 Long term (current) use of non-steroidal anti-inflammatories (NSAID): Secondary | ICD-10-CM | POA: Diagnosis not present

## 2014-02-07 DIAGNOSIS — Z79899 Other long term (current) drug therapy: Secondary | ICD-10-CM | POA: Diagnosis not present

## 2014-02-07 DIAGNOSIS — R109 Unspecified abdominal pain: Secondary | ICD-10-CM | POA: Diagnosis present

## 2014-02-07 DIAGNOSIS — G8929 Other chronic pain: Secondary | ICD-10-CM | POA: Diagnosis not present

## 2014-02-07 DIAGNOSIS — Z8719 Personal history of other diseases of the digestive system: Secondary | ICD-10-CM | POA: Diagnosis not present

## 2014-02-07 DIAGNOSIS — Z872 Personal history of diseases of the skin and subcutaneous tissue: Secondary | ICD-10-CM | POA: Insufficient documentation

## 2014-02-07 DIAGNOSIS — Z792 Long term (current) use of antibiotics: Secondary | ICD-10-CM | POA: Diagnosis not present

## 2014-02-07 DIAGNOSIS — Z9889 Other specified postprocedural states: Secondary | ICD-10-CM | POA: Insufficient documentation

## 2014-02-07 LAB — CBC WITH DIFFERENTIAL/PLATELET
Basophils Absolute: 0 10*3/uL (ref 0.0–0.1)
Basophils Relative: 0 % (ref 0–1)
EOS ABS: 0 10*3/uL (ref 0.0–0.7)
EOS PCT: 0 % (ref 0–5)
HEMATOCRIT: 42.1 % (ref 39.0–52.0)
Hemoglobin: 14.6 g/dL (ref 13.0–17.0)
LYMPHS PCT: 12 % (ref 12–46)
Lymphs Abs: 1.4 10*3/uL (ref 0.7–4.0)
MCH: 28.3 pg (ref 26.0–34.0)
MCHC: 34.7 g/dL (ref 30.0–36.0)
MCV: 81.6 fL (ref 78.0–100.0)
MONO ABS: 0.7 10*3/uL (ref 0.1–1.0)
Monocytes Relative: 6 % (ref 3–12)
Neutro Abs: 9.2 10*3/uL — ABNORMAL HIGH (ref 1.7–7.7)
Neutrophils Relative %: 82 % — ABNORMAL HIGH (ref 43–77)
PLATELETS: 246 10*3/uL (ref 150–400)
RBC: 5.16 MIL/uL (ref 4.22–5.81)
RDW: 12.8 % (ref 11.5–15.5)
WBC: 11.3 10*3/uL — ABNORMAL HIGH (ref 4.0–10.5)

## 2014-02-07 LAB — URINALYSIS, ROUTINE W REFLEX MICROSCOPIC
GLUCOSE, UA: NEGATIVE mg/dL
Ketones, ur: NEGATIVE mg/dL
Leukocytes, UA: NEGATIVE
Nitrite: NEGATIVE
PROTEIN: 30 mg/dL — AB
Specific Gravity, Urine: 1.026 (ref 1.005–1.030)
Urobilinogen, UA: 0.2 mg/dL (ref 0.0–1.0)
pH: 6 (ref 5.0–8.0)

## 2014-02-07 LAB — COMPREHENSIVE METABOLIC PANEL
ALT: 12 U/L (ref 0–53)
AST: 14 U/L (ref 0–37)
Albumin: 3.9 g/dL (ref 3.5–5.2)
Alkaline Phosphatase: 82 U/L (ref 39–117)
Anion gap: 19 — ABNORMAL HIGH (ref 5–15)
BUN: 13 mg/dL (ref 6–23)
CALCIUM: 9.4 mg/dL (ref 8.4–10.5)
CO2: 21 meq/L (ref 19–32)
CREATININE: 0.98 mg/dL (ref 0.50–1.35)
Chloride: 100 mEq/L (ref 96–112)
GFR calc Af Amer: >90 mL/min (ref >90)
GFR, EST NON AFRICAN AMERICAN: 86 mL/min — AB (ref >90)
Glucose, Bld: 113 mg/dL — ABNORMAL HIGH (ref 70–99)
Potassium: 3.3 mEq/L — ABNORMAL LOW (ref 3.7–5.3)
SODIUM: 140 meq/L (ref 137–147)
TOTAL PROTEIN: 7.8 g/dL (ref 6.0–8.3)
Total Bilirubin: 0.5 mg/dL (ref 0.3–1.2)

## 2014-02-07 LAB — URINE MICROSCOPIC-ADD ON

## 2014-02-07 MED ORDER — CEPHALEXIN 500 MG PO CAPS: 500.0000 mg | ORAL_CAPSULE | Freq: Four times a day (QID) | ORAL | Status: DC

## 2014-02-07 MED ORDER — TAMSULOSIN HCL 0.4 MG PO CAPS: 0.4000 mg | ORAL_CAPSULE | Freq: Every day | ORAL | Status: DC

## 2014-02-07 MED ORDER — HYDROMORPHONE HCL 1 MG/ML IJ SOLN
1.0000 mg | Freq: Once | INTRAMUSCULAR | Status: AC
  Administered 2014-02-07: 1 mg via INTRAVENOUS
  Filled 2014-02-07: qty 1

## 2014-02-07 MED ORDER — NAPROXEN 375 MG PO TABS: 375.0000 mg | ORAL_TABLET | Freq: Two times a day (BID) | ORAL | Status: DC

## 2014-02-07 NOTE — Discharge Instructions (Signed)
Kidney Stones  Kidney stones (urolithiasis) are deposits that form inside your kidneys. The intense pain is caused by the stone moving through the urinary tract. When the stone moves, the ureter goes into spasm around the stone. The stone is usually passed in the urine.   CAUSES   · A disorder that makes certain neck glands produce too much parathyroid hormone (primary hyperparathyroidism).  · A buildup of uric acid crystals, similar to gout in your joints.  · Narrowing (stricture) of the ureter.  · A kidney obstruction present at birth (congenital obstruction).  · Previous surgery on the kidney or ureters.  · Numerous kidney infections.  SYMPTOMS   · Feeling sick to your stomach (nauseous).  · Throwing up (vomiting).  · Blood in the urine (hematuria).  · Pain that usually spreads (radiates) to the groin.  · Frequency or urgency of urination.  DIAGNOSIS   · Taking a history and physical exam.  · Blood or urine tests.  · CT scan.  · Occasionally, an examination of the inside of the urinary bladder (cystoscopy) is performed.  TREATMENT   · Observation.  · Increasing your fluid intake.  · Extracorporeal shock wave lithotripsy--This is a noninvasive procedure that uses shock waves to break up kidney stones.  · Surgery may be needed if you have severe pain or persistent obstruction. There are various surgical procedures. Most of the procedures are performed with the use of small instruments. Only small incisions are needed to accommodate these instruments, so recovery time is minimized.  The size, location, and chemical composition are all important variables that will determine the proper choice of action for you. Talk to your health care provider to better understand your situation so that you will minimize the risk of injury to yourself and your kidney.   HOME CARE INSTRUCTIONS   · Drink enough water and fluids to keep your urine clear or pale yellow. This will help you to pass the stone or stone fragments.  · Strain  all urine through the provided strainer. Keep all particulate matter and stones for your health care provider to see. The stone causing the pain may be as small as a grain of salt. It is very important to use the strainer each and every time you pass your urine. The collection of your stone will allow your health care provider to analyze it and verify that a stone has actually passed. The stone analysis will often identify what you can do to reduce the incidence of recurrences.  · Only take over-the-counter or prescription medicines for pain, discomfort, or fever as directed by your health care provider.  · Make a follow-up appointment with your health care provider as directed.  · Get follow-up X-rays if required. The absence of pain does not always mean that the stone has passed. It may have only stopped moving. If the urine remains completely obstructed, it can cause loss of kidney function or even complete destruction of the kidney. It is your responsibility to make sure X-rays and follow-ups are completed. Ultrasounds of the kidney can show blockages and the status of the kidney. Ultrasounds are not associated with any radiation and can be performed easily in a matter of minutes.  SEEK MEDICAL CARE IF:  · You experience pain that is progressive and unresponsive to any pain medicine you have been prescribed.  SEEK IMMEDIATE MEDICAL CARE IF:   · Pain cannot be controlled with the prescribed medicine.  · You have a fever or   shaking chills.  · The severity or intensity of pain increases over 18 hours and is not relieved by pain medicine.  · You develop a new onset of abdominal pain.  · You feel faint or pass out.  · You are unable to urinate.  MAKE SURE YOU:   · Understand these instructions.  · Will watch your condition.  · Will get help right away if you are not doing well or get worse.  Document Released: 03/17/2005 Document Revised: 11/17/2012 Document Reviewed: 08/18/2012  [employer]® Patient Information ©2015  [employer]. This information is not intended to replace advice given to you by your health care provider. Make sure you discuss any questions you have with your health care provider.    Dietary Guidelines to Help Prevent Kidney Stones  Your risk of kidney stones can be decreased by adjusting the foods you eat. The most important thing you can do is drink enough fluid. You should drink enough fluid to keep your urine clear or pale yellow. The following guidelines provide specific information for the type of kidney stone you have had.  GUIDELINES ACCORDING TO TYPE OF KIDNEY STONE  Calcium Oxalate Kidney Stones  · Reduce the amount of salt you eat. Foods that have a lot of salt cause your body to release excess calcium into your urine. The excess calcium can combine with a substance called oxalate to form kidney stones.  · Reduce the amount of animal protein you eat if the amount you eat is excessive. Animal protein causes your body to release excess calcium into your urine. Ask your dietitian how much protein from animal sources you should be eating.  · Avoid foods that are high in oxalates. If you take vitamins, they should have less than 500 mg of vitamin C. Your body turns vitamin C into oxalates. You do not need to avoid fruits and vegetables high in vitamin C.  Calcium Phosphate Kidney Stones  · Reduce the amount of salt you eat to help prevent the release of excess calcium into your urine.  · Reduce the amount of animal protein you eat if the amount you eat is excessive. Animal protein causes your body to release excess calcium into your urine. Ask your dietitian how much protein from animal sources you should be eating.  · Get enough calcium from food or take a calcium supplement (ask your dietitian for recommendations). Food sources of calcium that do not increase your risk of kidney stones include:  ¨ Broccoli.  ¨ Dairy products, such as cheese and yogurt.  ¨ Pudding.  Uric Acid Kidney Stones  · Do not  have more than 6 oz of animal protein per day.  FOOD SOURCES  Animal Protein Sources  · Meat (all types).  · Poultry.  · Eggs.  · Fish, seafood.  Foods High in Salt  · Salt seasonings.  · Soy sauce.  · Teriyaki sauce.  · Cured and processed meats.  · Salted crackers and snack foods.  · Fast food.  · Canned soups and most canned foods.  Foods High in Oxalates  · Grains:  ¨ Amaranth.  ¨ Barley.  ¨ Grits.  ¨ Wheat germ.  ¨ Bran.  ¨ Buckwheat flour.  ¨ All bran cereals.  ¨ Pretzels.  ¨ Whole wheat bread.  · Vegetables:  ¨ Beans (wax).  ¨ Beets and beet greens.  ¨ Collard greens.  ¨ Eggplant.  ¨ Escarole.  ¨ Leeks.  ¨ Okra.  ¨ Parsley.  ¨ Rutabagas.  ¨   Spinach.  ¨ Swiss chard.  ¨ Tomato paste.  ¨ Fried potatoes.  ¨ Sweet potatoes.  · Fruits:  ¨ Red currants.  ¨ Figs.  ¨ Kiwi.  ¨ Rhubarb.  · Meat and Other Protein Sources:  ¨ Beans (dried).  ¨ Soy burgers and other soybean products.  ¨ Miso.  ¨ Nuts (peanuts, almonds, pecans, cashews, hazelnuts).  ¨ Nut butters.  ¨ Sesame seeds and tahini (paste made of sesame seeds).  ¨ Poppy seeds.  · Beverages:  ¨ Chocolate drink mixes.  ¨ Soy milk.  ¨ Instant iced tea.  ¨ Juices made from high-oxalate fruits or vegetables.  · Other:  ¨ Carob.  ¨ Chocolate.  ¨ Fruitcake.  ¨ Marmalades.  Document Released: 07/12/2010 Document Revised: 03/22/2013 Document Reviewed: 02/11/2013  Exit[employer]® Patient Information ©2015 [employer]. This information is not intended to replace advice given to you by your health care provider. Make sure you discuss any questions you have with your health care provider.

## 2014-02-07 NOTE — ED Notes (Signed)
Per pt, states chronic back pain-different pain right lower back that started this am-goes to pain clinic

## 2014-02-07 NOTE — ED Provider Notes (Signed)
CSN: [id number]     Arrival date & time 02/07/14  0811 History   First MD Initiated Contact with Patient 02/07/14 [phone]     Chief Complaint  Patient presents with  . chronic back pain      (Consider location/radiation/quality/duration/timing/severity/associated sxs/prior Treatment) HPI Comments: Patient presents with sudden onset right flank pain at 3 AM this morning. He reports history of kidney stones about a year and a half ago  Patient is a 63 y.o. male presenting with flank pain. The history is provided by the patient. No language interpreter was used.  Flank Pain This is a new problem. The current episode started 6 to 12 hours ago. The problem occurs constantly. The problem has not changed since onset.Pertinent negatives include no chest pain, no abdominal pain, no headaches and no shortness of breath. Nothing aggravates the symptoms. Nothing relieves the symptoms. He has tried nothing for the symptoms. The treatment provided no relief.    Past Medical History  Diagnosis Date  . Acute kidney failure, unspecified   . Allergic rhinitis, cause unspecified   . Other chronic pain   . Unspecified pruritic disorder   . Hordeolum externum     Stye in R lower eyelid  . Obesity, Class III, BMI 40-49.9 (morbid obesity)   . Arthritis   . Chronic back pain   . GERD (gastroesophageal reflux disease)     takes Protonix daily  . History of gastric ulcer     x2  . GI bleeding 1989  . History of blood transfusion     no abnormal reaction noted  . Depressive disorder, not elsewhere classified     takes Cymbalta and Abilify daily   Past Surgical History  Procedure Laterality Date  . Ankle surgery Left   . Back surgery    . Hand surgery Left   . Leg surgery Bilateral   . 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 Left testcle removed    . Vasectomy    . Tonsillectomy      as a child  . Tibia fracture surgery      multiple  . Spinal cord stimulator placed    . Spinal cord stimulator  removed    . Nasal sinus surgery  03/26/2012    with fusion, x3  . Sinus endo w/fusion  03/26/2012    Procedure: ENDOSCOPIC SINUS SURGERY WITH FUSION NAVIGATION;  Surgeon: Ruby Cola, MD;  Location: [facility] OR;  Service: ENT;  Laterality: N/A;  LEFT ETHMOIDECTOMY; BILATERAL SPHENOIDECTOMY; LEFT MAXILLARY ANTROSTOMY WITH IMAGE GUIDANCE; REPAIR LEFT SPHENOID CSF LEAK; NASAL SEPTAL FLAP  . Abdominal fat graph  03/26/2012    Procedure: ABDOMINAL FAT GRAPH;  Surgeon: Ruby Cola, MD;  Location: [facility] OR;  Service: ENT;  Laterality: N/A;  . Esophagogastroduodenoscopy N/A 05/27/2012    Procedure: ESOPHAGOGASTRODUODENOSCOPY (EGD);  Surgeon: Shann Medal, MD;  Location: [facility];  Service: General;  Laterality: N/A;  . Fracture surgery      left leg as child  . Laparoscopic gastric sleeve resection N/A 08/10/2012    Procedure: LAPAROSCOPIC GASTRIC SLEEVE RESECTION;  Surgeon: Madilyn Hook, DO;  Location: [facility] ORS;  Service: General;  Laterality: N/A;  . Esophagogastroduodenoscopy N/A 08/10/2012    Procedure: ESOPHAGOGASTRODUODENOSCOPY (EGD);  Surgeon: Madilyn Hook, DO;  Location: [facility] ORS;  Service: General;  Laterality: N/A;   Family History  Problem Relation Age of Onset  . Asthma Other   . Depression Other   . Diabetes Other   .  Heart disease Other   . Hypertension Other   . Stroke Other   . Stroke Father    History  Substance Use Topics  . Smoking status: Never Smoker   . Smokeless tobacco: Never Used  . Alcohol Use: Yes     Comment: rare    Review of Systems  Constitutional: Negative for fever, activity change, appetite change and fatigue.  HENT: Negative for congestion, facial swelling, rhinorrhea and trouble swallowing.   Eyes: Negative for photophobia and pain.  Respiratory: Negative for cough, chest tightness and shortness of breath.   Cardiovascular: Negative for chest pain and leg swelling.  Gastrointestinal: Negative for nausea, vomiting, abdominal pain, diarrhea and  constipation.  Endocrine: Negative for polydipsia and polyuria.  Genitourinary: Positive for flank pain. Negative for dysuria, urgency, decreased urine volume and difficulty urinating.  Musculoskeletal: Negative for back pain and gait problem.  Skin: Negative for color change, rash and wound.  Allergic/Immunologic: Negative for immunocompromised state.  Neurological: Negative for dizziness, facial asymmetry, speech difficulty, weakness, numbness and headaches.  Psychiatric/Behavioral: Negative for confusion, decreased concentration and agitation.      Allergies  Review of patient's allergies indicates no known allergies.  Home Medications   Prior to Admission medications   Medication Sig Start Date End Date Taking? Authorizing Provider  ARIPiprazole (ABILIFY) 2 MG tablet Take 2 mg by mouth at bedtime.    Yes Historical Provider, MD  diclofenac sodium (VOLTAREN) 1 % GEL Apply 2 g topically 4 (four) times daily.   Yes Historical Provider, MD  DULoxetine (CYMBALTA) 20 MG capsule Take 20 mg by mouth 2 (two) times daily.   Yes Historical Provider, MD  ibuprofen (ADVIL,MOTRIN) 200 MG tablet Take 800 mg by mouth every 6 (six) hours as needed for moderate pain.   Yes Historical Provider, MD  OxyCODONE HCl ER (OXYCONTIN) 30 MG T12A Take 30 mg by mouth 6 (six) times daily. For pain   Yes Historical Provider, MD  cephALEXin (KEFLEX) 500 MG capsule Take 1 capsule (500 mg total) by mouth 4 (four) times daily. 02/07/14   Ernestina Patches, MD  DULoxetine (CYMBALTA) 60 MG capsule Take 60 mg by mouth daily.    Historical Provider, MD  Multiple Vitamin (MULTIVITAMIN WITH MINERALS) TABS Take 1 tablet by mouth daily.    Historical Provider, MD  naproxen (NAPROSYN) 375 MG tablet Take 1 tablet (375 mg total) by mouth 2 (two) times daily. 02/07/14   Ernestina Patches, MD  tamsulosin (FLOMAX) 0.4 MG CAPS capsule Take 1 capsule (0.4 mg total) by mouth daily. 02/07/14   Ernestina Patches, MD   BP 165/104 mmHg  Pulse 55   Temp(Src) 98.3 F (36.8 C) (Oral)  Resp 16  SpO2 97% Physical Exam  Constitutional: He is oriented to person, place, and time. He appears well-developed and well-nourished. No distress.  HENT:  Head: Normocephalic and atraumatic.  Mouth/Throat: No oropharyngeal exudate.  Eyes: Pupils are equal, round, and reactive to light.  Neck: Normal range of motion. Neck supple.  Cardiovascular: Normal rate, regular rhythm and normal heart sounds.  Exam reveals no gallop and no friction rub.   No murmur heard. Pulmonary/Chest: Effort normal and breath sounds normal. No respiratory distress. He has no wheezes. He has no rales.  Abdominal: Soft. Bowel sounds are normal. He exhibits no distension and no mass. There is no tenderness. There is no rebound and no guarding.  Musculoskeletal: Normal range of motion. He exhibits no edema or tenderness.  Neurological: He is alert and oriented  to person, place, and time.  Skin: Skin is warm and dry.  Psychiatric: He has a normal mood and affect.    ED Course  Procedures (including critical care time) Labs Review Labs Reviewed  URINALYSIS, ROUTINE W REFLEX MICROSCOPIC - Abnormal; Notable for the following:    Color, Urine AMBER (*)    Hgb urine dipstick LARGE (*)    Bilirubin Urine SMALL (*)    Protein, ur 30 (*)    All other components within normal limits  CBC WITH DIFFERENTIAL - Abnormal; Notable for the following:    WBC 11.3 (*)    Neutrophils Relative % 82 (*)    Neutro Abs 9.2 (*)    All other components within normal limits  COMPREHENSIVE METABOLIC PANEL - Abnormal; Notable for the following:    Potassium 3.3 (*)    Glucose, Bld 113 (*)    GFR calc non Af Amer 86 (*)    Anion gap 19 (*)    All other components within normal limits  URINE MICROSCOPIC-ADD ON    Imaging Review Ct Renal Stone Study  02/07/2014   CLINICAL DATA:  Flank pain and dysuria  EXAM: CT ABDOMEN AND PELVIS WITHOUT CONTRAST  TECHNIQUE: Multidetector CT imaging of  the abdomen and pelvis was performed following the standard protocol without oral or intravenous contrast material administration.  COMPARISON:  June 17, 2013 CT abdomen and pelvis and abdominal MRI June 17, 2013  FINDINGS: There is mild scarring in the lung bases. The lung bases are otherwise clear.  There is an 8 mm cyst in the anterior segment of the right lobe of the liver. No other focal liver lesions are identified on this noncontrast enhanced study. There is no appreciable biliary duct dilatation. There is a gallstone within the gallbladder measuring 4.0 x 2.9 cm in size. Gallbladder wall is not thickened.  Spleen, pancreas, and adrenals appear normal. Patient is status post gastric bypass/sleeve resection procedure.  A mass arising from the upper pole of the right kidney medially measures 1.3 x 0.9 cm, stable. This mass shows increased attenuation and does not meet criteria for a cyst. 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 No new renal masses are identified.  There is a 2 mm nonobstructing calculus in the mid left kidney with a nearby 1 mm nonobstructing calculus mid right kidney. Several tiny calculi are noted in the mid to lower pole region on the right as well. There is moderate hydronephrosis on the right. There is moderate stranding in the perirenal fascia on the right. On the left, there is no hydronephrosis. There is a 4 x 4 mm calculus in the mid kidney.  On the right, there is a 4 mm calculus in the proximal right ureter at the level of L4-5. No other ureteral calculus is seen in the right. No ureteral calculi are seen on the left.  In the pelvis, there is mild urinary bladder wall thickening. Prostate is mildly prominent and contains multiple calcifications. There is fat in both inguinal rings. Appendix appears normal. Terminal ileum appears normal.  There is a moderate ventral hernia containing only fat, stable.  No bowel obstruction.  No free  air or portal venous air.  There is no appreciable ascites, adenopathy, or abscess in the abdomen or pelvis. There is some wall thickening in the mid to distal sigmoid colon with what appears to be some circumferential narrowing of the wall in this area. There  is no surrounding mesenteric stranding, however. There is atherosclerotic change in the aorta but no appreciable aneurysm. There are hemangiomas in the L3, L4, and L5 vertebral bodies toward the left. There is evidence of previous trauma involving the lateral left iliac crest. No blastic or lytic bone lesions are appreciable. Slight anterior wedging of the L1 vertebral body is stable.  IMPRESSION: 4 mm calculus in the right ureter at the level of L4-5 causing moderate hydronephrosis and proximal ureterectasis. There is moderate perinephric stranding on the right as well.  Right-sided renal masses. Mass in the upper pole right kidney which is noncystic by report has been sampled previously. 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  Nonobstructing calculi in each kidney.  Status post gastric sleeve resection procedure.  Suspect a degree of cystitis. Prostate is prominent and contains multiple calcifications. It may be prudent to correlate with PSA if PSA has not been performed recently.  There is somewhat circumferential appearing wall thickening in the mid to distal sigmoid colon, best seen on axial slices 73 through 81, series 2. This appearance may warrant sigmoidoscopy for further assessment.  Moderate ventral hernia containing only fat. Fat is noted in each inguinal ring.  Cholelithiasis.  Appendix and terminal ileum appear normal.   Electronically Signed   By: Lowella Grip M.D.   On: 02/07/2014 09:43     EKG Interpretation None      MDM   Final diagnoses:  Right flank pain    Pt is a 63 y.o. male with Pmhx as above who presents with acute onset right flank pain around 3 AM this morning. He  denies associated nausea, vomiting, dysuria or hematuria. Pain is non radiating, constant and sharp. He states that he has a history of chronic low back pain but that this pain is in a different location than his usual pain. Patient had a noncontrast study in March 2015 and was at that time having left flank pain and had a left-sided ureteral stone. The scan also showed several right-sided renal stones. He also has a history of acute renal failure. He's had no falls or injuries. On physical exam vital signs are stable and he is in no acute distress.  hehe has no CVA tenderness or overlying skin changes in the right flank. CBC and BMP are grossly unremarkable. Urine with large blood.  CT stone study with 4 mm calculus in the right ureter at the level of L4-L5 causing moderate hydronephrosis. He also has perinephric stranding, suspected cystitis. He also has a large prostate with multiple calcifications. Given possible cystitis we'll place on Keflex. I instructed him to follow up with his PCP for PSA testing. Also recommend that he have a colonoscopy if he has not had one in the past given report of circumferential wall thickening in the mid to distal sigmoid colon. Patient has been given prescription for Flomax and instructions for outpatient follow-up with urology. Return precautions given for new or worsening symptoms including worsening pain and fever.        Ernestina Patches, MD 02/07/14 [phone]

## 2014-05-23 LAB — BASIC METABOLIC PANEL
BUN: 10 (ref 4–21)
CREATININE: 0.9 (ref <=1.3)
Glucose: 180
SODIUM: 140 (ref 137–147)

## 2014-05-23 LAB — CBC AND DIFFERENTIAL: WBC: 5.3

## 2014-05-23 LAB — HEPATIC FUNCTION PANEL: Bilirubin, Total: 0.4

## 2014-05-23 LAB — PSA: PSA: 2.2

## 2014-06-17 ENCOUNTER — Emergency Department (HOSPITAL_COMMUNITY)
Admission: EM | Admit: 2014-06-17 | Discharge: 2014-06-17 | Disposition: A | Discharge status: Home / Self Care | Payer: Medicare Other | Attending: Emergency Medicine | Admitting: Emergency Medicine

## 2014-06-17 ENCOUNTER — Encounter (HOSPITAL_COMMUNITY): Payer: Self-pay | Admitting: Emergency Medicine

## 2014-06-17 ENCOUNTER — Emergency Department (HOSPITAL_COMMUNITY): Payer: Medicare Other

## 2014-06-17 ENCOUNTER — Emergency Department (HOSPITAL_COMMUNITY)
Admission: EM | Admit: 2014-06-17 | Discharge: 2014-06-18 | Disposition: A | Discharge status: Home / Self Care | Payer: Medicare Other | Attending: Emergency Medicine | Admitting: Emergency Medicine

## 2014-06-17 DIAGNOSIS — Z872 Personal history of diseases of the skin and subcutaneous tissue: Secondary | ICD-10-CM | POA: Insufficient documentation

## 2014-06-17 DIAGNOSIS — F329 Major depressive disorder, single episode, unspecified: Secondary | ICD-10-CM | POA: Insufficient documentation

## 2014-06-17 DIAGNOSIS — Z79899 Other long term (current) drug therapy: Secondary | ICD-10-CM | POA: Diagnosis not present

## 2014-06-17 DIAGNOSIS — Z8659 Personal history of other mental and behavioral disorders: Secondary | ICD-10-CM | POA: Diagnosis not present

## 2014-06-17 DIAGNOSIS — K219 Gastro-esophageal reflux disease without esophagitis: Secondary | ICD-10-CM | POA: Insufficient documentation

## 2014-06-17 DIAGNOSIS — R55 Syncope and collapse: Secondary | ICD-10-CM | POA: Diagnosis not present

## 2014-06-17 DIAGNOSIS — Z792 Long term (current) use of antibiotics: Secondary | ICD-10-CM | POA: Diagnosis not present

## 2014-06-17 DIAGNOSIS — M1990 Unspecified osteoarthritis, unspecified site: Secondary | ICD-10-CM | POA: Insufficient documentation

## 2014-06-17 DIAGNOSIS — G8929 Other chronic pain: Secondary | ICD-10-CM | POA: Insufficient documentation

## 2014-06-17 DIAGNOSIS — Z791 Long term (current) use of non-steroidal anti-inflammatories (NSAID): Secondary | ICD-10-CM | POA: Diagnosis not present

## 2014-06-17 DIAGNOSIS — K922 Gastrointestinal hemorrhage, unspecified: Secondary | ICD-10-CM | POA: Diagnosis not present

## 2014-06-17 DIAGNOSIS — E860 Dehydration: Secondary | ICD-10-CM | POA: Insufficient documentation

## 2014-06-17 DIAGNOSIS — E669 Obesity, unspecified: Secondary | ICD-10-CM | POA: Insufficient documentation

## 2014-06-17 DIAGNOSIS — R531 Weakness: Secondary | ICD-10-CM

## 2014-06-17 DIAGNOSIS — Z87448 Personal history of other diseases of urinary system: Secondary | ICD-10-CM | POA: Insufficient documentation

## 2014-06-17 DIAGNOSIS — M6281 Muscle weakness (generalized): Secondary | ICD-10-CM | POA: Diagnosis not present

## 2014-06-17 LAB — BASIC METABOLIC PANEL
ANION GAP: 3 — AB (ref 5–15)
BUN: 29 mg/dL — AB (ref 6–23)
CHLORIDE: 106 mmol/L (ref 96–112)
CO2: 28 mmol/L (ref 19–32)
CREATININE: 0.8 mg/dL (ref 0.50–1.35)
Calcium: 8.3 mg/dL — ABNORMAL LOW (ref 8.4–10.5)
GLUCOSE: 110 mg/dL — AB (ref 70–99)
Potassium: 3.8 mmol/L (ref 3.5–5.1)
Sodium: 137 mmol/L (ref 135–145)

## 2014-06-17 LAB — CBC WITH DIFFERENTIAL/PLATELET
BASOS ABS: 0 10*3/uL (ref 0.0–0.1)
Basophils Relative: 0 % (ref 0–1)
Eosinophils Absolute: 0.1 10*3/uL (ref 0.0–0.7)
Eosinophils Relative: 1 % (ref 0–5)
HEMATOCRIT: 33.1 % — AB (ref 39.0–52.0)
HEMOGLOBIN: 11 g/dL — AB (ref 13.0–17.0)
Lymphocytes Relative: 27 % (ref 12–46)
Lymphs Abs: 2 10*3/uL (ref 0.7–4.0)
MCH: 28.1 pg (ref 26.0–34.0)
MCHC: 33.2 g/dL (ref 30.0–36.0)
MCV: 84.4 fL (ref 78.0–100.0)
Monocytes Absolute: 0.4 10*3/uL (ref 0.1–1.0)
Monocytes Relative: 5 % (ref 3–12)
Neutro Abs: 5 10*3/uL (ref 1.7–7.7)
Neutrophils Relative %: 67 % (ref 43–77)
Platelets: 218 10*3/uL (ref 150–400)
RBC: 3.92 MIL/uL — AB (ref 4.22–5.81)
RDW: 14.2 % (ref 11.5–15.5)
WBC: 7.4 10*3/uL (ref 4.0–10.5)

## 2014-06-17 LAB — TSH: TSH: 1.224 u[IU]/mL (ref 0.350–4.500)

## 2014-06-17 LAB — I-STAT TROPONIN, ED
TROPONIN I, POC: 0 ng/mL (ref 0.00–0.08)
TROPONIN I, POC: 0 ng/mL (ref 0.00–0.08)

## 2014-06-17 LAB — BRAIN NATRIURETIC PEPTIDE: B Natriuretic Peptide: 33.6 pg/mL (ref 0.0–100.0)

## 2014-06-17 LAB — HEPATIC FUNCTION PANEL
ALBUMIN: 3.1 g/dL — AB (ref 3.5–5.2)
ALT: 13 U/L (ref 0–53)
AST: 15 U/L (ref 0–37)
Alkaline Phosphatase: 74 U/L (ref 39–117)
TOTAL PROTEIN: 6 g/dL (ref 6.0–8.3)
Total Bilirubin: 0.6 mg/dL (ref 0.3–1.2)

## 2014-06-17 LAB — CBG MONITORING, ED: Glucose-Capillary: 115 mg/dL — ABNORMAL HIGH (ref 70–99)

## 2014-06-17 LAB — POC OCCULT BLOOD, ED: Fecal Occult Bld: POSITIVE — AB

## 2014-06-17 MED ORDER — SODIUM CHLORIDE 0.9 % IV BOLUS (SEPSIS)
2000.0000 mL | Freq: Once | INTRAVENOUS | Status: AC
  Administered 2014-06-17: 2000 mL via INTRAVENOUS

## 2014-06-17 MED ORDER — OXYCODONE-ACETAMINOPHEN 5-325 MG PO TABS
2.0000 | ORAL_TABLET | Freq: Once | ORAL | Status: AC
  Administered 2014-06-17: 2 via ORAL
  Filled 2014-06-17: qty 2

## 2014-06-17 MED ORDER — OMEPRAZOLE 20 MG PO CPDR: 20.0000 mg | DELAYED_RELEASE_CAPSULE | Freq: Every day | ORAL | Status: DC

## 2014-06-17 NOTE — ED Provider Notes (Signed)
CSN: [id number]     Arrival date & time 06/17/14  1317 History   First MD Initiated Contact with Patient 06/17/14 1318     Chief Complaint  Patient presents with  . Weakness     (Consider location/radiation/quality/duration/timing/severity/associated sxs/prior Treatment) HPI Randy Grimes is a 64 y.o. male with history of chronic back pain comes in for evaluation of generalized weakness. Patient states approximately 3 days ago he became intermittently dizzy and had an unsteady gait and was "grazing the walls". That sensation resolved. Yesterday he reports one episode of nausea and emesis of his baked beans and hot dog at lunch. He reports last night he was "up and down all night peeing and became extremely short of breath with any type of walking or exertion". He still reports shortness of breath with exertion today and reports this morning, just prior to arrival in the ED he experienced "extreme sweatiness, cool skin and clamminess". He was encouraged by his wife to come to the ED for further evaluation and management of his symptoms. He denies any headaches, changes in vision, chest pain, abdominal pain, diarrhea constipation, numbness, syncope, rash, urinary symptoms.  Past Medical History  Diagnosis Date  . Acute kidney failure, unspecified   . Allergic rhinitis, cause unspecified   . Other chronic pain   . Unspecified pruritic disorder   . Hordeolum externum     Stye in R lower eyelid  . Obesity, Class III, BMI 40-49.9 (morbid obesity)   . Arthritis   . Chronic back pain   . GERD (gastroesophageal reflux disease)     takes Protonix daily  . History of gastric ulcer     x2  . GI bleeding 1989  . History of blood transfusion     no abnormal reaction noted  . Depressive disorder, not elsewhere classified     takes Cymbalta and Abilify daily   Past Surgical History  Procedure Laterality Date  . Ankle surgery Left   . Back surgery    . Hand surgery Left   . Leg surgery  Bilateral   . 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 Left testcle removed    . Vasectomy    . Tonsillectomy      as a child  . Tibia fracture surgery      multiple  . Spinal cord stimulator placed    . Spinal cord stimulator removed    . Nasal sinus surgery  03/26/2012    with fusion, x3  . Sinus endo w/fusion  03/26/2012    Procedure: ENDOSCOPIC SINUS SURGERY WITH FUSION NAVIGATION;  Surgeon: Ruby Cola, MD;  Location: [facility] OR;  Service: ENT;  Laterality: N/A;  LEFT ETHMOIDECTOMY; BILATERAL SPHENOIDECTOMY; LEFT MAXILLARY ANTROSTOMY WITH IMAGE GUIDANCE; REPAIR LEFT SPHENOID CSF LEAK; NASAL SEPTAL FLAP  . Abdominal fat graph  03/26/2012    Procedure: ABDOMINAL FAT GRAPH;  Surgeon: Ruby Cola, MD;  Location: [facility] OR;  Service: ENT;  Laterality: N/A;  . Esophagogastroduodenoscopy N/A 05/27/2012    Procedure: ESOPHAGOGASTRODUODENOSCOPY (EGD);  Surgeon: Shann Medal, MD;  Location: [facility];  Service: General;  Laterality: N/A;  . Fracture surgery      left leg as child  . Laparoscopic gastric sleeve resection N/A 08/10/2012    Procedure: LAPAROSCOPIC GASTRIC SLEEVE RESECTION;  Surgeon: Madilyn Hook, DO;  Location: [facility] ORS;  Service: General;  Laterality: N/A;  . Esophagogastroduodenoscopy N/A 08/10/2012    Procedure: ESOPHAGOGASTRODUODENOSCOPY (EGD);  Surgeon: Madilyn Hook, DO;  Location: [facility] ORS;  Service: General;  Laterality: N/A;   Family History  Problem Relation Age of Onset  . Asthma Other   . Depression Other   . Diabetes Other   . Heart disease Other   . Hypertension Other   . Stroke Other   . Stroke Father    History  Substance Use Topics  . Smoking status: Never Smoker   . Smokeless tobacco: Never Used  . Alcohol Use: Yes     Comment: rare    Review of Systems A 10 point review of systems was completed and was negative except for pertinent positives and negatives as mentioned in the history of present illness     Allergies  Review of patient's allergies  indicates no known allergies.  Home Medications   Prior to Admission medications   Medication Sig Start Date End Date Taking? Authorizing Provider  diclofenac sodium (VOLTAREN) 1 % GEL Apply 2 g topically at bedtime.    Yes Historical Provider, MD  ibuprofen (ADVIL,MOTRIN) 200 MG tablet Take 800 mg by mouth every 6 (six) hours as needed for moderate pain.   Yes Historical Provider, MD  oxycodone (ROXICODONE) 30 MG immediate release tablet Take 30 mg by mouth 4 (four) times daily.  05/19/14  Yes Historical Provider, MD  cephALEXin (KEFLEX) 500 MG capsule Take 1 capsule (500 mg total) by mouth 4 (four) times daily. Patient not taking: Reported on 06/17/2014 02/07/14   Ernestina Patches, MD  naproxen (NAPROSYN) 375 MG tablet Take 1 tablet (375 mg total) by mouth 2 (two) times daily. Patient not taking: Reported on 06/17/2014 02/07/14   Ernestina Patches, MD  omeprazole (PRILOSEC) 20 MG capsule Take 1 capsule (20 mg total) by mouth daily. 06/17/14   Comer Locket, PA-C  tamsulosin (FLOMAX) 0.4 MG CAPS capsule Take 1 capsule (0.4 mg total) by mouth daily. Patient not taking: Reported on 06/17/2014 02/07/14   Ernestina Patches, MD   BP 133/88 mmHg  Pulse 67  Temp(Src) 98.5 F (36.9 C) (Oral)  Resp 14  Ht 6' (1.829 m)  Wt 264 lb (119.75 kg)  BMI 35.80 kg/m2  SpO2 99% Physical Exam  Constitutional: He is oriented to person, place, and time. He appears well-developed and well-nourished.  HENT:  Head: Normocephalic and atraumatic.  Mouth/Throat: Oropharynx is clear and moist.  Eyes: Conjunctivae are normal. Pupils are equal, round, and reactive to light. Right eye exhibits no discharge. Left eye exhibits no discharge. No scleral icterus.  Neck: Normal range of motion. Neck supple.  Cardiovascular: Normal rate, regular rhythm and normal heart sounds.   Pulmonary/Chest: Effort normal and breath sounds normal. No respiratory distress. He has no wheezes. He has no rales.  Abdominal: Soft.  Chronic  umbilical hernia present. Obese. Abdomen soft, mild distention. Mild discomfort with palpation to lower left quadrant and left peri-umbilical region.  Genitourinary: Rectum normal.  No frank blood on rectal exam. No active hemorrhoids, fissures. Soft brown stool on exam glove.  Musculoskeletal: He exhibits no tenderness.  Neurological: He is alert and oriented to person, place, and time.  Cranial nerves II-12 grossly intact. Motor and sensation 5/5 in all 4 extremities.  Skin: Skin is warm and dry. No rash noted.  Psychiatric: He has a normal mood and affect.  Nursing note and vitals reviewed.   ED Course  Procedures (including critical care time) Labs Review Labs Reviewed  BASIC METABOLIC PANEL - Abnormal; Notable for the following:    Glucose, Bld 110 (*)  BUN 29 (*)    Calcium 8.3 (*)    Anion gap 3 (*)    All other components within normal limits  CBC WITH DIFFERENTIAL/PLATELET - Abnormal; Notable for the following:    RBC 3.92 (*)    Hemoglobin 11.0 (*)    HCT 33.1 (*)    All other components within normal limits  POC OCCULT BLOOD, ED - Abnormal; Notable for the following:    Fecal Occult Bld POSITIVE (*)    All other components within normal limits  BRAIN NATRIURETIC PEPTIDE  HEPATIC FUNCTION PANEL  Randolm Idol, ED    Imaging Review Dg Chest 2 View  06/17/2014   CLINICAL DATA:  Pt. Feeling fatigue, weakness, SOB since Wed. Pt. States he feels clammy and cold. GERD. No other problems.  EXAM: CHEST  2 VIEW  COMPARISON:  03/18/2012  FINDINGS: Cardiac silhouette size and configuration. Aorta is mildly uncoiled. No mediastinal or masses or evidence of adenopathy.  Clear lungs.  No pleural effusion or pneumothorax.  Bony thorax is intact.  IMPRESSION: No active cardiopulmonary disease.   Electronically Signed   By: Lajean Manes M.D.   On: 06/17/2014 14:22     EKG Interpretation None      ED ECG REPORT   Date: 06/17/2014  Rate: 67  Rhythm: normal sinus rhythm   QRS Axis: normal  Intervals: normal  ST/T Wave abnormalities: normal  Conduction Disutrbances:none  Narrative Interpretation: Periodic, unsustained PVCs  Old EKG Reviewed: changes noted  I have personally reviewed the EKG tracing and agree with the computerized printout as noted.  Meds given in ED:  Medications  oxyCODONE-acetaminophen (PERCOCET/ROXICET) 5-325 MG per tablet 2 tablet (2 tablets Oral Given 06/17/14 1514)    New Prescriptions   OMEPRAZOLE (PRILOSEC) 20 MG CAPSULE    Take 1 capsule (20 mg total) by mouth daily.   Filed Vitals:   06/17/14 1423 06/17/14 1430 06/17/14 1500 06/17/14 1715  BP: 161/92 143/96 170/103 133/88  Pulse: 67 59 73 67  Temp:    98.5 F (36.9 C)  TempSrc:    Oral  Resp: 18 13 11 14   Height:      Weight:      SpO2: 99% 99% 99% 99%    MDM  Vitals stable - WNL -afebrile Pt resting comfortably in ED. Denies discomfort now, just generalized weakness. PE--essentially benign physical exam. No active bleeding on exam. Labwork : Hemoglobin 11.0 down from 14.6 and November 2015. Hemoccult positive Imaging chest x-ray shows no acute cardiopulmonary pathology.  DDX--patient with generalized weakness. EKG shows periodic PVCs, unsustained. Patient is hemodynamically stable with no overt bleeding on exam, hemoglobin of 11. Will have patient follow-up next week with GI for subsequent endoscopy. DC with omeprazole. Feel patient is stable to be discharged home to follow up with his PCP next week and potential evaluation by cardiology for outpatient cardiac monitoring.  I discussed all relevant lab findings and imaging results with pt and they verbalized understanding. Discussed f/u with PCP within 48 hrs and return precautions, pt very amenable to plan. Patient stable, in good condition and maybe attentive ED without difficulty.  Prior to patient discharge, I discussed and reviewed this case with Dr. Vanita Panda, who also saw and evaluated the patient.      Final diagnoses:  Gastrointestinal hemorrhage, unspecified gastritis, unspecified gastrointestinal hemorrhage type        Comer Locket, PA-C 06/17/14 1733  Carmin Muskrat, MD 06/18/14 [phone]

## 2014-06-17 NOTE — ED Notes (Signed)
Per ems, pt reports weakness with nausea and vomiting and frequent urination yesterday, weakness today.  Pt arrives  Awake, alert, oriented, only c/o weakness at this time

## 2014-06-17 NOTE — ED Notes (Signed)
Per EMS: Pt seen here earlier for dizziness and weakness.  Pt sts he went home and "didn't feel any better".  Before EMS arrived there was an unknown incident of possible LOC.  When Fire arrived pt was diaphoretic and pale.  EMS placed pt on 2L  and vitals have been stable since transport began.  Pt diaphoretic and "cold" during transport.  Patient A&O in the room.  Denies any pain except chronic back pain.

## 2014-06-17 NOTE — ED Notes (Signed)
Pt ambulated in hallway in presence of Md and family members; pt has unsteady gait while ambulating but able to hold his own weight up with out assistance; Pt set for discharge

## 2014-06-17 NOTE — Discharge Instructions (Signed)
Fecal Occult Blood Test This is a test done on a stool specimen to screen for gastrointestinal bleeding, which may be an indicator of colon cancer Is is usually done as part of a routine examination, annually, after age 63 or as directed by your caregiver. The fecal occult blood test (FOBT) checks for blood in your stool. Normally, there will not be enough blood lost through the gastrointestinal tract to turn an FOBT positive or for you to notice it visually in the form of bloody or dark, tarry stools. Any significant amount of blood being passed should be investigated.  A positive FOBT will tell your caregiver that you have bleeding occurring somewhere in your gastrointestinal tract. This blood loss could be due to ulcers, diverticulosis, bleeding polyps, inflammatory bowel disease, hemorrhoids, from swallowed blood due to bleeding gums or nosebleeds, or it could be due to benign or cancerous tumors. Anything that protrudes into the lumen (the empty space in the intestine), like a polyp or tumor, and is rubbed against by the fecal waste as it passes through has the potential to eventually bleed intermittently. Often this small amount of blood is the first, and sometimes the only, symptom of early colon cancer, making the FOBT a valuable screening tool. PREPARATION FOR TEST  You should not eat red meat within three days before testing. Other substances that could cause a false positive test result include fish, turnips, horseradish, and drugs such as colchicines and oxidizing drugs (for example, iodine and boric acid). Be sure to carefully follow your caregiver's instructions. With FOBT, your caregiver or laboratory will give you one or more test "cards." You collect a separate sample from three different stools, usually on consecutive days. Each stool sample should be collected into a clean container and should not be contaminated with urine or water. The slide is labeled with your name and the date; then,  with an applicator stick, you apply a thin smear of stool onto each filter paper square/window contained on the card. Allow the filter paper to dry. Once it is dry, it is stable. Usually you will collect all of the consecutive samples, and then return all of them to your caregiver or laboratory at the same time, sometimes by mailing them. There are also over the counter tests which are dropped in your toilet. NORMAL FINDINGS   No occult blood within the stool.  The FOBT test is normally negative. A positive indicates either blood in the stool or an interfering substance. Multiple samples are done to: 1) catch intermittent bleeding; and 2) help rule out false positives. Ranges for normal findings may vary among different laboratories and hospitals. You should always check with your doctor after having lab work or other tests done to discuss the meaning of your test results and whether your values are considered within normal limits. MEANING OF TEST  Your caregiver will go over the test results with you and discuss the importance and meaning of your results, as well as treatment options and the need for additional tests if necessary. OBTAINING THE TEST RESULTS  It is your responsibility to obtain your test results. Ask the lab or department performing the test when and how you will get your results. Document Released: 04/11/2004 Document Revised: 06/09/2011 Document Reviewed: 02/25/2008 [facility] Patient Information 2015 [place], [place]. This information is not intended to replace advice given to you by your health care provider. Make sure you discuss any questions you have with your health care provider.  You were evaluated in  the ED today for your weakness. You were found to have a low hemoglobin of 11.0 and a positive stool sample for blood. He appears to have a slow GI bleed. It is important for you to follow-up with gastroenterology today may do an endoscopy for further evaluation and management of  your symptoms. Please take your antacids as directed. Please return to ED for new or worsening symptoms.

## 2014-06-17 NOTE — Discharge Instructions (Signed)
Dehydration, Adult Dehydration means your body does not have as much fluid as it needs. Your kidneys, brain, and heart will not work properly without the right amount of fluids and salt.  HOME CARE  Ask your doctor how to replace body fluid losses (rehydrate).  Drink enough fluids to keep your pee (urine) clear or pale yellow.  Drink small amounts of fluids often if you feel sick to your stomach (nauseous) or throw up (vomit).  Eat like you normally do.  Avoid:  Foods or drinks high in sugar.  Bubbly (carbonated) drinks.  Juice.  Very hot or cold fluids.  Drinks with caffeine.  Fatty, greasy foods.  Alcohol.  Tobacco.  Eating too much.  Gelatin desserts.  Wash your hands to avoid spreading germs (bacteria, viruses).  Only take medicine as told by your doctor.  Keep all doctor visits as told. GET HELP RIGHT AWAY IF:   You cannot drink something without throwing up.  You get worse even with treatment.  Your vomit has blood in it or looks greenish.  Your poop (stool) has blood in it or looks black and tarry.  You have not peed in 6 to 8 hours.  You pee a small amount of very dark pee.  You have a fever.  You pass out (faint).  You have belly (abdominal) pain that gets worse or stays in one spot (localizes).  You have a rash, stiff neck, or bad headache.  You get easily annoyed, sleepy, or are hard to wake up.  You feel weak, dizzy, or very thirsty. MAKE SURE YOU:   Understand these instructions.  Will watch your condition.  Will get help right away if you are not doing well or get worse. Document Released: 01/11/2009 Document Revised: 06/09/2011 Document Reviewed: 11/04/2010 [facility] Patient Information 2015 [place], [place]. This information is not intended to replace advice given to you by your health care provider. Make sure you discuss any questions you have with your health care provider.  Near-Syncope Near-syncope (commonly known as near  fainting) is sudden weakness, dizziness, or feeling like you might pass out. This can happen when getting up or while standing for a long time. It is caused by a sudden decrease in blood flow to the brain, which can occur for various reasons. Most of the reasons are not serious.  HOME CARE Watch your condition for any changes.  Have someone stay with you until you feel stable.  If you feel like you are going to pass out:  Lie down right away.  Prop your feet up if you can.  Breathe deeply and steadily.  Move only when the feeling has gone away. Most of the time, this feeling lasts only a few minutes. You may feel tired for several hours.  Drink enough fluids to keep your pee (urine) clear or pale yellow.  If you are taking blood pressure or heart medicine, stand up slowly.  Follow up with your doctor as told. GET HELP RIGHT AWAY IF:   You have a severe headache.  You have unusual pain in the chest, belly (abdomen), or back.  You have bleeding from the mouth or butt (rectum), or you have black or tarry poop (stool).  You feel your heart beat differently than normal, or you have a very fast pulse.  You pass out, or you twitch and shake when you pass out.  You pass out when sitting or lying down.  You feel confused.  You have trouble walking.  You  are weak.  You have vision problems. MAKE SURE YOU:   Understand these instructions.  Will watch your condition.  Will get help right away if you are not doing well or get worse. Document Released: 09/03/2007 Document Revised: 03/22/2013 Document Reviewed: 08/20/2012 [facility] Patient Information 2015 [place], [place]. This information is not intended to replace advice given to you by your health care provider. Make sure you discuss any questions you have with your health care provider.

## 2014-06-17 NOTE — ED Notes (Signed)
Pt requesting to take home dose of 30mg  Roxicodone.  Okay'd by MD. When RN went to help pt to find medication, unable to locate in pt belongings.

## 2014-06-17 NOTE — ED Notes (Signed)
Patient returned from X-ray 

## 2014-06-17 NOTE — ED Notes (Signed)
Patient transported to X-ray 

## 2014-06-17 NOTE — ED Provider Notes (Signed)
CSN: [id number]     Arrival date & time 06/17/14  2019 History   First MD Initiated Contact with Patient 06/17/14 2031     Chief Complaint  Patient presents with  . Weakness     (Consider location/radiation/quality/duration/timing/severity/associated sxs/prior Treatment) Patient is a 64 y.o. male presenting with weakness. The history is provided by the patient.  Weakness This is a new problem. The current episode started in the past 7 days. The problem occurs constantly. The problem has been gradually worsening. Associated symptoms include diaphoresis, fatigue and weakness. Pertinent negatives include no abdominal pain, chest pain, congestion, coughing, fever or nausea. The symptoms are aggravated by exertion. He has tried rest for the symptoms. The treatment provided moderate relief.    Past Medical History  Diagnosis Date  . Acute kidney failure, unspecified   . Allergic rhinitis, cause unspecified   . Other chronic pain   . Unspecified pruritic disorder   . Hordeolum externum     Stye in R lower eyelid  . Obesity, Class III, BMI 40-49.9 (morbid obesity)   . Arthritis   . Chronic back pain   . GERD (gastroesophageal reflux disease)     takes Protonix daily  . History of gastric ulcer     x2  . GI bleeding 1989  . History of blood transfusion     no abnormal reaction noted  . Depressive disorder, not elsewhere classified     takes Cymbalta and Abilify daily   Past Surgical History  Procedure Laterality Date  . Ankle surgery Left   . Back surgery    . Hand surgery Left   . Leg surgery Bilateral   . 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 Left testcle removed    . Vasectomy    . Tonsillectomy      as a child  . Tibia fracture surgery      multiple  . Spinal cord stimulator placed    . Spinal cord stimulator removed    . Nasal sinus surgery  03/26/2012    with fusion, x3  . Sinus endo w/fusion  03/26/2012    Procedure: ENDOSCOPIC SINUS SURGERY WITH FUSION  NAVIGATION;  Surgeon: Ruby Cola, MD;  Location: [facility] OR;  Service: ENT;  Laterality: N/A;  LEFT ETHMOIDECTOMY; BILATERAL SPHENOIDECTOMY; LEFT MAXILLARY ANTROSTOMY WITH IMAGE GUIDANCE; REPAIR LEFT SPHENOID CSF LEAK; NASAL SEPTAL FLAP  . Abdominal fat graph  03/26/2012    Procedure: ABDOMINAL FAT GRAPH;  Surgeon: Ruby Cola, MD;  Location: [facility] OR;  Service: ENT;  Laterality: N/A;  . Esophagogastroduodenoscopy N/A 05/27/2012    Procedure: ESOPHAGOGASTRODUODENOSCOPY (EGD);  Surgeon: Shann Medal, MD;  Location: [facility];  Service: General;  Laterality: N/A;  . Fracture surgery      left leg as child  . Laparoscopic gastric sleeve resection N/A 08/10/2012    Procedure: LAPAROSCOPIC GASTRIC SLEEVE RESECTION;  Surgeon: Madilyn Hook, DO;  Location: [facility] ORS;  Service: General;  Laterality: N/A;  . Esophagogastroduodenoscopy N/A 08/10/2012    Procedure: ESOPHAGOGASTRODUODENOSCOPY (EGD);  Surgeon: Madilyn Hook, DO;  Location: [facility] ORS;  Service: General;  Laterality: N/A;   Family History  Problem Relation Age of Onset  . Asthma Other   . Depression Other   . Diabetes Other   . Heart disease Other   . Hypertension Other   . Stroke Other   . Stroke Father    History  Substance Use Topics  . Smoking status: Never Smoker   .  Smokeless tobacco: Never Used  . Alcohol Use: Yes     Comment: rare    Review of Systems  Constitutional: Positive for diaphoresis and fatigue. Negative for fever.  HENT: Negative for congestion.   Respiratory: Negative for cough, chest tightness, shortness of breath and wheezing.   Cardiovascular: Negative for chest pain, palpitations and leg swelling.  Gastrointestinal: Negative for nausea, abdominal pain, diarrhea and constipation.  Neurological: Positive for weakness.  All other systems reviewed and are negative.     Allergies  Review of patient's allergies indicates no known allergies.  Home Medications   Prior to Admission medications   Medication Sig  Start Date End Date Taking? Authorizing Provider  diclofenac sodium (VOLTAREN) 1 % GEL Apply 2 g topically at bedtime.    Yes Historical Provider, MD  ibuprofen (ADVIL,MOTRIN) 200 MG tablet Take 800 mg by mouth every 6 (six) hours as needed for moderate pain.   Yes Historical Provider, MD  oxycodone (ROXICODONE) 30 MG immediate release tablet Take 30 mg by mouth 4 (four) times daily.  05/19/14  Yes Historical Provider, MD  cephALEXin (KEFLEX) 500 MG capsule Take 1 capsule (500 mg total) by mouth 4 (four) times daily. Patient not taking: Reported on 06/17/2014 02/07/14   Ernestina Patches, MD  naproxen (NAPROSYN) 375 MG tablet Take 1 tablet (375 mg total) by mouth 2 (two) times daily. Patient not taking: Reported on 06/17/2014 02/07/14   Ernestina Patches, MD  omeprazole (PRILOSEC) 20 MG capsule Take 1 capsule (20 mg total) by mouth daily. Patient not taking: Reported on 06/17/2014 06/17/14   Comer Locket, PA-C  tamsulosin (FLOMAX) 0.4 MG CAPS capsule Take 1 capsule (0.4 mg total) by mouth daily. Patient not taking: Reported on 06/17/2014 02/07/14   Ernestina Patches, MD   BP 104/60 mmHg  Pulse 59  Temp(Src) 98 F (36.7 C) (Oral)  Resp 14  Ht 6' (1.829 m)  Wt 265 lb (120.203 kg)  BMI 35.93 kg/m2  SpO2 100% Physical Exam  Constitutional: He is oriented to person, place, and time. He appears well-developed and well-nourished. No distress.  Appears fatigued, depressed affect  HENT:  Head: Normocephalic and atraumatic.  Mouth/Throat: No oropharyngeal exudate.  Mucous membranes dry  Eyes: Conjunctivae and EOM are normal. Pupils are equal, round, and reactive to light.  Neck: Normal range of motion. Neck supple.  Cardiovascular: Normal rate, regular rhythm, normal heart sounds and intact distal pulses.  Exam reveals no gallop and no friction rub.   No murmur heard. Pulmonary/Chest: Effort normal and breath sounds normal. No respiratory distress. He has no wheezes. He has no rales.  Abdominal: Soft.  He exhibits no distension and no mass. There is no tenderness. There is no rebound and no guarding.  Musculoskeletal: Normal range of motion. He exhibits no edema or tenderness.  Lymphadenopathy:    He has no cervical adenopathy.  Neurological: He is alert and oriented to person, place, and time. He has normal strength. No cranial nerve deficit or sensory deficit. Coordination normal.  5/5 strength in bilateral upper and lower extremities and symmetric.  Skin: Skin is warm and dry. No rash noted. He is not diaphoretic.  Psychiatric: He has a normal mood and affect. His behavior is normal. Judgment and thought content normal.  Nursing note and vitals reviewed.   ED Course  Procedures (including critical care time) Labs Review Labs Reviewed  CBG MONITORING, ED - Abnormal; Notable for the following:    Glucose-Capillary 115 (*)    All other  components within normal limits  TSH  I-STAT TROPOININ, ED    Imaging Review Dg Chest 2 View  06/17/2014   CLINICAL DATA:  Pt. Feeling fatigue, weakness, SOB since Wed. Pt. States he feels clammy and cold. GERD. No other problems.  EXAM: CHEST  2 VIEW  COMPARISON:  03/18/2012  FINDINGS: Cardiac silhouette size and configuration. Aorta is mildly uncoiled. No mediastinal or masses or evidence of adenopathy.  Clear lungs.  No pleural effusion or pneumothorax.  Bony thorax is intact.  IMPRESSION: No active cardiopulmonary disease.   Electronically Signed   By: Lajean Manes M.D.   On: 06/17/2014 14:22     EKG Interpretation   Date/Time:  Saturday June 17 2014 20:31:19 EDT Ventricular Rate:  67 PR Interval:  176 QRS Duration: 102 QT Interval:  425 QTC Calculation: 449 R Axis:   9 Text Interpretation:  Sinus rhythm nsst changes inferolateral leads with t  wave inversion lateral leads Multiple ventricular premature complexes  Abnormal R-wave progression, early transition Borderline repolarization  abnormality Confirmed by RAY MD, Andee Poles [phone]) on  06/17/2014 8:55:15 PM      MDM   Final diagnoses:  Generalized weakness  Dehydration  Near syncope   64 year old male with a past medical history of chronic back pain on narcotics as well as previous GI bleed from peptic ulcer disease who presents with generalized fatigue and weakness as well as shortness of breath and presyncope. Evaluated here earlier with negative troponin, reassuring electrolytes, Hemoccult positive but blood counts are stable, chest x-ray negative. He denies any fevers or chest pain or cough. He appears generally fatigued but has no focal deficits. Lungs are clear to auscultation bilaterally. Does not have clear influenza-like syndrome, however has components of that as well as evidence of dehydration. He has dry mucous membranes and only drinks sweet tea. This could be viral type illness in the setting of dehydration. He does not have any cardiac risk factors. His EKG does have some mild ST changes in the lateral leads which is unchanged from previous. We will repeat a troponin at this time. I will fluid hydrate him and reassess.  Repeat troponin negative. TSH within normal limits. Marked symptomatic improvement with fluids. We will admit the patient and send discharged home with continued oral hydration and PCP follow-up in the next 24-48 hours. Return with chest pain precautions discussed.  Larence Penning, MD 06/17/14 2774  Pattricia Boss, MD 06/21/14 [phone]

## 2015-04-30 ENCOUNTER — Encounter: Payer: Self-pay | Admitting: Neurology

## 2015-04-30 ENCOUNTER — Ambulatory Visit (INDEPENDENT_AMBULATORY_CARE_PROVIDER_SITE_OTHER): Payer: Medicare Other | Admitting: Neurology

## 2015-04-30 VITALS — BP 172/89 | HR 52 | Ht 72.0 in | Wt 279.0 lb

## 2015-04-30 DIAGNOSIS — E538 Deficiency of other specified B group vitamins: Secondary | ICD-10-CM | POA: Diagnosis not present

## 2015-04-30 DIAGNOSIS — F329 Major depressive disorder, single episode, unspecified: Secondary | ICD-10-CM

## 2015-04-30 DIAGNOSIS — R5382 Chronic fatigue, unspecified: Secondary | ICD-10-CM

## 2015-04-30 DIAGNOSIS — F32A Depression, unspecified: Secondary | ICD-10-CM

## 2015-04-30 DIAGNOSIS — R413 Other amnesia: Secondary | ICD-10-CM | POA: Insufficient documentation

## 2015-04-30 HISTORY — DX: Other amnesia: R41.3

## 2015-04-30 MED ORDER — ALPRAZOLAM 0.5 MG PO TABS: ORAL_TABLET | ORAL | Status: DC

## 2015-04-30 NOTE — Progress Notes (Signed)
Reason for visit: Memory disorder  Referring physician: Dr. [name] is a 65 y.o. male  History of present illness:  Randy Grimes is a 65 year old right-handed white male with a ten to 15 year history of issues with memory. The patient indicates that there has not been severe progression of memory problems over time. He himself is not concerned about the memory, he comes to the doctor only because his wife wanted him to do so. The patient indicates that he has both short-term and long-term memory issues, he has troubles remembering what he did in his childhood and adolescence, he also has difficulty remembering recent events. He denies any issues keeping up with his appointments or medications. He operates a Teacher, music, he denies any issues with getting lost or with safety issues with driving. He does have some problems with remembering names. He denies any numbness or weakness of the face, arms, or legs. He denies any significant balance issues. He denies problems controlling the bowels or the bladder. He has to get up every 2 or 3 hours at night to use the bathroom, he may have some difficulty getting back to sleep. He has been on disability following a fall at work in 2007 where he injured his hips and knees and ankles. He does report a history of depression, he currently is on no medication for his depression. He was on Cymbalta and Abilify a year ago, but when he ran out of medication he never got the prescription refilled. He has never seen a psychiatrist for his depression.  Past Medical History  Diagnosis Date  . Acute kidney failure, unspecified ([place])   . Allergic rhinitis, cause unspecified   . Other chronic pain   . Unspecified pruritic disorder   . Hordeolum externum     Stye in R lower eyelid  . Obesity, Class III, BMI 40-49.9 (morbid obesity) ([place])   . Arthritis   . Chronic back pain   . GERD (gastroesophageal reflux disease)     takes Protonix daily  .  History of gastric ulcer     x2  . GI bleeding 1989  . History of blood transfusion     no abnormal reaction noted  . Depressive disorder, not elsewhere classified     takes Cymbalta and Abilify daily  . Memory disorder 04/30/2015    Past Surgical History  Procedure Laterality Date  . Ankle surgery Left   . Back surgery    . Hand surgery Left   . Leg surgery Bilateral   . 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 Left testcle removed    . Vasectomy    . Tonsillectomy      as a child  . Tibia fracture surgery      multiple  . Spinal cord stimulator placed    . Spinal cord stimulator removed    . Nasal sinus surgery  03/26/2012    with fusion, x3  . Sinus endo w/fusion  03/26/2012    Procedure: ENDOSCOPIC SINUS SURGERY WITH FUSION NAVIGATION;  Surgeon: Ruby Cola, MD;  Location: [facility] OR;  Service: ENT;  Laterality: N/A;  LEFT ETHMOIDECTOMY; BILATERAL SPHENOIDECTOMY; LEFT MAXILLARY ANTROSTOMY WITH IMAGE GUIDANCE; REPAIR LEFT SPHENOID CSF LEAK; NASAL SEPTAL FLAP  . Abdominal fat graph  03/26/2012    Procedure: ABDOMINAL FAT GRAPH;  Surgeon: Ruby Cola, MD;  Location: [facility];  Service: ENT;  Laterality: N/A;  . Esophagogastroduodenoscopy N/A 05/27/2012  Procedure: ESOPHAGOGASTRODUODENOSCOPY (EGD);  Surgeon: Shann Medal, MD;  Location: [facility];  Service: General;  Laterality: N/A;  . Fracture surgery      left leg as child  . Laparoscopic gastric sleeve resection N/A 08/10/2012    Procedure: LAPAROSCOPIC GASTRIC SLEEVE RESECTION;  Surgeon: Madilyn Hook, DO;  Location: [facility];  Service: General;  Laterality: N/A;  . Esophagogastroduodenoscopy N/A 08/10/2012    Procedure: ESOPHAGOGASTRODUODENOSCOPY (EGD);  Surgeon: Madilyn Hook, DO;  Location: [facility];  Service: General;  Laterality: N/A;    Family History  Problem Relation Age of Onset  . Asthma Other   . Depression Other   . Diabetes Other   . Heart disease Other   . Hypertension Other   . Stroke Other   . Stroke  Father   . Dementia Neg Hx     Social history:  reports that he has never smoked. He has never used smokeless tobacco. He reports that he does not drink alcohol or use illicit drugs.  Medications:  Prior to Admission medications   Medication Sig Start Date End Date Taking? Authorizing Provider  diclofenac sodium (VOLTAREN) 1 % GEL Apply 2 g topically at bedtime.    Yes Historical Provider, MD  ibuprofen (ADVIL,MOTRIN) 200 MG tablet Take 800 mg by mouth every 6 (six) hours as needed for moderate pain.   Yes Historical Provider, MD  oxycodone (ROXICODONE) 30 MG immediate release tablet Take 30 mg by mouth 4 (four) times daily.  05/19/14  Yes Historical Provider, MD     No Known Allergies  ROS:  Out of a complete 14 system review of symptoms, the patient complains only of the following symptoms, and all other reviewed systems are negative.  Constipation Depression Memory disturbance  Blood pressure 172/89, pulse 52, height 6' (1.829 m), weight 279 lb (126.554 kg).  Physical Exam  General: The patient is alert and cooperative at the time of the examination. The patient is markedly obese.  Eyes: Pupils are equal, round, and reactive to light. Discs are flat bilaterally.  Neck: The neck is supple, no carotid bruits are noted.  Respiratory: The respiratory examination is clear.  Cardiovascular: The cardiovascular examination reveals a regular rate and rhythm, no obvious murmurs or rubs are noted.  Skin: Extremities are with 2-3+ edema below the knees bilaterally.  Neurologic Exam  Mental status: The patient is alert and oriented x 2 at the time of the examination (not oriented to date). The patient has apparent normal recent and remote memory, with an apparently normal attention span and concentration ability. The Mini-Mental status examination done today shows a total score 24/30. The patient is able to name 4 animals in 30 seconds.  Cranial nerves: Facial symmetry is present.  There is good sensation of the face to pinprick and soft touch bilaterally. The strength of the facial muscles and the muscles to head turning and shoulder shrug are normal bilaterally. Speech is well enunciated, no aphasia or dysarthria is noted. Extraocular movements are full. Visual fields are full. The tongue is midline, and the patient has symmetric elevation of the soft palate. No obvious hearing deficits are noted.  Motor: The motor testing reveals 5 over 5 strength of all 4 extremities. Good symmetric motor tone is noted throughout.  Sensory: Sensory testing is intact to pinprick, soft touch, vibration sensation, and position sense on all 4 extremities, with exception of decreased vibration sensation and pinprick sensation on the left leg below the knee. No evidence of extinction is  noted.  Coordination: Cerebellar testing reveals good finger-nose-finger and heel-to-shin bilaterally.  Gait and station: Gait is normal. Tandem gait is normal. Romberg is negative. No drift is seen.  Reflexes: Deep tendon reflexes are symmetric and normal bilaterally. Toes are downgoing bilaterally.   Assessment/Plan:  1. Reported memory disturbance  2. Depression  The patient reports long-term and short-term memory problems over the last 10-15 years. The patient has problems with depression that are currently not treated. My clinical impression is that the patient has a pseudodementia associated with depression. We will pursue further workup at this time. The patient will undergo MRI of the brain, and blood work today. I will set him up for formal neuropsychological evaluation, he will follow-up in 6 months.  Jill Alexanders MD 04/30/2015 6:04 PM  [facility] [address] [place], [place] [address]  Phone [phone] Fax [phone]

## 2015-04-30 NOTE — Patient Instructions (Signed)
   We will get blood work today and get MRI of the brain and neuropsychological testing.

## 2015-05-04 LAB — SEDIMENTATION RATE: Sed Rate: 15 mm/hr (ref 0–30)

## 2015-05-04 LAB — METHYLMALONIC ACID, SERUM: Methylmalonic Acid: 261 nmol/L (ref 0–378)

## 2015-05-04 LAB — RPR: RPR: NONREACTIVE

## 2015-05-04 LAB — HIV ANTIBODY (ROUTINE TESTING W REFLEX): HIV Screen 4th Generation wRfx: NONREACTIVE

## 2015-05-04 LAB — VITAMIN B12: VITAMIN B 12: 439 pg/mL (ref 211–946)

## 2015-05-04 LAB — TSH: TSH: 0.838 u[IU]/mL (ref 0.450–4.500)

## 2015-05-07 ENCOUNTER — Inpatient Hospital Stay: Admission: RE | Admit: 2015-05-07 | Payer: Medicare Other | Source: Ambulatory Visit

## 2015-05-12 ENCOUNTER — Ambulatory Visit
Admission: RE | Admit: 2015-05-12 | Discharge: 2015-05-12 | Disposition: A | Discharge status: Home / Self Care | Payer: Medicare Other | Source: Ambulatory Visit | Attending: Neurology | Admitting: Neurology

## 2015-05-12 DIAGNOSIS — R413 Other amnesia: Secondary | ICD-10-CM

## 2015-05-12 DIAGNOSIS — F32A Depression, unspecified: Secondary | ICD-10-CM

## 2015-05-12 DIAGNOSIS — F329 Major depressive disorder, single episode, unspecified: Secondary | ICD-10-CM

## 2015-05-13 ENCOUNTER — Telehealth: Payer: Self-pay | Admitting: Neurology

## 2015-05-13 NOTE — Telephone Encounter (Signed)
I called the patient. The study shows some chronic SVD, not enough to cause memory problems, but this is a new finding from 2013. The patient is to go on aspirin 81.   MRI brain 05/13/15:  IMPRESSION: This MRI of the brain without contrast shows the following:  1. 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  2. 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 It was not present on CT scan from 12/09/2009. 3. Evidence of prior right maxillary surgery. There continues to be minimal chronic sinusitis involving the maxillary sinuses 4. There are no acute findings.

## 2015-06-29 ENCOUNTER — Encounter (HOSPITAL_COMMUNITY): Payer: Self-pay | Admitting: Psychiatry

## 2015-06-29 ENCOUNTER — Ambulatory Visit (INDEPENDENT_AMBULATORY_CARE_PROVIDER_SITE_OTHER): Payer: Medicare Other | Admitting: Psychiatry

## 2015-06-29 VITALS — BP 136/82 | HR 64 | Ht 72.0 in | Wt 295.0 lb

## 2015-06-29 DIAGNOSIS — F329 Major depressive disorder, single episode, unspecified: Secondary | ICD-10-CM

## 2015-06-29 NOTE — Progress Notes (Signed)
Psychiatric Initial Adult Assessment   Patient Identification: Randy Grimes MRN:  [id number] Date of Evaluation:  06/29/2015 Referral Source: Self-referred Chief Complaint:   poor concentration and cannot remember Visit Diagnosis major depression, chronic, mild  History of Present Illness:  This patient is a 65 year old white separated father is on disability for orthopedic problems. The patient has a long complex history. 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 He then had a fall 30 feet breaking both of his legs and permanently damaging his ankle. He has chronic back pain. 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 Eventually he applied and in time got disability. He's now been on disability for a number of years. The patient is very poor historian. What he can tell me is that he's been married over 40 years that he loves his wife but cannot communicate and negotiate. As a result infected worry of this year she separated and asked him to move out into an apartment. In some ways he does demonstrate abilities to take care of himself. He still drives make sure he has Internet and television and minimal amount of furniture. He lives alone his hygiene is very poor. The patient is a very complex family situation. He has 3 children one of them presently is incarcerated or drugs. Joellen [place] was incarcerated at 3 children some of them being cared for by the patient's wife. Today her interview included the wife, Cecille Rubin. His wife says that the patient has been more and more withdrawn over the years. She says in general he was admitted and was disinterested in family activities. The patient cannot recall what psychiatric history as had been apparently he was under the care of Dr. love a psychiatrist in [facility] and treated with multiple psychiatric medications. According to the patient's wife he  never would give them a chance to work. In 2009 the patient overdosed either is a suicide attempt or is a mistake with his pain medications. It was a very traumatic experience as he was comatose and on a ventilator for a number of days. Eventually when he awoke he was transferred to [facility] for 3 days and was discharged. At this time the patient distinctly denies being depressed. His wife on the other hand says he is profoundly depressed. The patient does acknowledge that he has a significant loss of the ability to enjoy things despite the fact that he still listens to music and watches TV. But is clear that he has lost his ability to get pleasure from very much. He says he sleeping poorly since his wife moved out. The patient says he is eating well and has normal energy. He clearly has problems concentrating. He feels a level of worthlessness but he denies being suicidal now. The patient denies the use of alcohol or drugs. His wife agrees. The patient is never experienced auditory or visual hallucinations. The patient is never had symptoms of mania. The patient denies any specific symptoms consistent with generalized anxiety disorder panic disorder or OCD. The patient denies any other times of head trauma but clearly was profoundly affected by being in a coma. This year he did see a neurologist in [facility] and apparently had a CAT scan as well. The patient is in a pain clinic and takes 30 mg of oxycodone 4 times a day. He has chronic back  and hip pain. Other than this psychiatric hospitalization at our institution he never been in a psychiatric hospital otherwise. The patient is never been in psychotherapy by his report. The patient one point was put on Risperdal which produce significant side effects. At this time the patient lives alone takes care of all his basic needs and still drives. He knowledge is the only reason he is here because he wants to get back  with his wife. I suspect she'll do anything that we asked of him to get back with her.  Associated Signs/Symptoms: Depression Symptoms:  depressed mood, (Hypo) Manic Symptoms:   Anxiety Symptoms:   Psychotic Symptoms:   PTSD Symptoms:   Past Psychiatric History:   Previous Psychotropic Medications: Yes   Substance Abuse History in the last 12 months:  No.  Consequences of Substance Abuse: Past Medical History:  Past Medical History  Diagnosis Date  . Acute kidney failure, unspecified ([place])   . Allergic rhinitis, cause unspecified   . Other chronic pain   . Unspecified pruritic disorder   . Hordeolum externum     Stye in R lower eyelid  . Obesity, Class III, BMI 40-49.9 (morbid obesity) ([place])   . Arthritis   . Chronic back pain   . GERD (gastroesophageal reflux disease)     takes Protonix daily  . History of gastric ulcer     x2  . GI bleeding 1989  . History of blood transfusion     no abnormal reaction noted  . Depressive disorder, not elsewhere classified     takes Cymbalta and Abilify daily  . Memory disorder 04/30/2015    Past Surgical History  Procedure Laterality Date  . Ankle surgery Left   . Back surgery    . Hand surgery Left   . Leg surgery Bilateral   . 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 Left testcle removed    . Vasectomy    . Tonsillectomy      as a child  . Tibia fracture surgery      multiple  . Spinal cord stimulator placed    . Spinal cord stimulator removed    . Nasal sinus surgery  03/26/2012    with fusion, x3  . Sinus endo w/fusion  03/26/2012    Procedure: ENDOSCOPIC SINUS SURGERY WITH FUSION NAVIGATION;  Surgeon: Ruby Cola, MD;  Location: [facility] OR;  Service: ENT;  Laterality: N/A;  LEFT ETHMOIDECTOMY; BILATERAL SPHENOIDECTOMY; LEFT MAXILLARY ANTROSTOMY WITH IMAGE GUIDANCE; REPAIR LEFT SPHENOID CSF LEAK; NASAL SEPTAL FLAP  . Abdominal fat graph  03/26/2012    Procedure: ABDOMINAL FAT GRAPH;  Surgeon: Ruby Cola, MD;  Location:  [facility] OR;  Service: ENT;  Laterality: N/A;  . Esophagogastroduodenoscopy N/A 05/27/2012    Procedure: ESOPHAGOGASTRODUODENOSCOPY (EGD);  Surgeon: Shann Medal, MD;  Location: [facility];  Service: General;  Laterality: N/A;  . Fracture surgery      left leg as child  . Laparoscopic gastric sleeve resection N/A 08/10/2012    Procedure: LAPAROSCOPIC GASTRIC SLEEVE RESECTION;  Surgeon: Madilyn Hook, DO;  Location: [facility];  Service: General;  Laterality: N/A;  . Esophagogastroduodenoscopy N/A 08/10/2012    Procedure: ESOPHAGOGASTRODUODENOSCOPY (EGD);  Surgeon: Madilyn Hook, DO;  Location: [facility];  Service: General;  Laterality: N/A;    Family Psychiatric History:   Family History:  Family History  Problem Relation Age of Onset  . Asthma Other   . Depression Other   . Diabetes Other   .  Heart disease Other   . Hypertension Other   . Stroke Other   . Stroke Father   . Dementia Neg Hx     Social History:   Social History   Social History  . Marital Status: Married    Spouse Name: N/A  . Number of Children: 5  . Years of Education: 13   Occupational History  . disabled    Social History Main Topics  . Smoking status: Never Smoker   . Smokeless tobacco: Never Used  . Alcohol Use: No     Comment: rare  . Drug Use: No  . Sexual Activity: Not Asked   Other Topics Concern  . None   Social History Narrative   Has been on disability since 2006   Never smoker or drinker   Curtrently lives in [place] with wife   Patient drinks caffeine occasionally.   Patient is right handed.     Additional Social History:   Allergies:  No Known Allergies  Metabolic Disorder Labs: Lab Results  Component Value Date   HGBA1C 6.2* 11/26/2012   MPG 131* 11/26/2012   MPG 128* 08/11/2012   No results found for: PROLACTIN Lab Results  Component Value Date   CHOL 259* 03/01/2012   TRIG 213* 03/01/2012   HDL 42 03/01/2012   CHOLHDL 6.2 03/01/2012   VLDL 43* 03/01/2012   LDLCALC  174* 03/01/2012     Current Medications: Current Outpatient Prescriptions  Medication Sig Dispense Refill  . ALPRAZolam (XANAX) 0.5 MG tablet Take 2 tablets 45 minutes before scheduled MRI, take a third tablet if needed 3 tablet 0  . diclofenac sodium (VOLTAREN) 1 % GEL Apply 2 g topically at bedtime.     Marland Kitchen ibuprofen (ADVIL,MOTRIN) 200 MG tablet Take 800 mg by mouth every 6 (six) hours as needed for moderate pain.    Marland Kitchen oxycodone (ROXICODONE) 30 MG immediate release tablet Take 30 mg by mouth 4 (four) times daily.   0   No current facility-administered medications for this visit.    Neurologic: Headache: No Seizure: No Paresthesias:No  Musculoskeletal: Strength & Muscle Tone: within normal limits Gait & Station: normal Patient leans: N/A  Psychiatric Specialty Exam: ROS  Blood pressure 136/82, pulse 64, height 6' (1.829 m), weight 295 lb (133.811 kg).Body mass index is 40 kg/(m^2).  General Appearance: Disheveled  Eye Contact:  Good  Speech:  Clear and Coherent  Volume:  Normal  Mood:  Depressed  Affect:  Appropriate  Thought Process:  Coherent  Orientation:  Full (Time, Place, and Person)  Thought Content:  WDL  Suicidal Thoughts:  No  Homicidal Thoughts:  No  Memory:  NA  Judgement:  NA  Insight:  Good  Psychomotor Activity:  Normal  Concentration:  Poor  Recall:  Poor  Fund of Knowledge:Good  Language: Fair  Akathisia:  No  Handed:  Right  AIMS (if indicated):    Assets:  Desire for Improvement  ADL's:  Intact  Cognition: WNL  Sleep:      Treatment Plan Summary: At this time we will make an attempt to get old information from Dr. love the psychiatrist from [facility] and make contact with [facility] about his care in their assessment. At this time the patient is not in acute distress. I suspect the possibility of marital therapy might be the most important intervention. The consideration of using Ritalin to help him with concentration  is not out of the question but is not clear  what exactly he be concentrating on. I do not think the patient is profoundly depressed. I think he is a chronic state of sadness perhaps consistent with dysthymic disorder. This patient's appointment in 7 weeks. He denies chest pain or shortness of breath or any neurological symptoms at this time.   Haskel Schroeder, MD 3/31/20179:35 AM

## 2015-08-07 ENCOUNTER — Ambulatory Visit: Payer: Medicare Other | Attending: Psychology | Admitting: Psychology

## 2015-08-07 DIAGNOSIS — F341 Dysthymic disorder: Secondary | ICD-10-CM | POA: Insufficient documentation

## 2015-08-07 DIAGNOSIS — R41840 Attention and concentration deficit: Secondary | ICD-10-CM | POA: Insufficient documentation

## 2015-08-07 DIAGNOSIS — R413 Other amnesia: Secondary | ICD-10-CM | POA: Insufficient documentation

## 2015-08-07 DIAGNOSIS — F601 Schizoid personality disorder: Secondary | ICD-10-CM | POA: Insufficient documentation

## 2015-08-08 ENCOUNTER — Encounter: Payer: Self-pay | Admitting: Psychology

## 2015-08-08 NOTE — Progress Notes (Signed)
[facility]  [address]   Telephone [phone] [address] Fax [phone] [place], [place] [address]  Initial Contact Note  Name:  [name] Date of Birth; May 02, 1950 MRN:  [id number] Date:  08/08/2015  Randy Grimes is an 65 y.o. male who was referred for neuropsychological evaluation by Floyde Parkins, MD due to problems with short-term and long-term memory.   A total of 6 hours was spent today reviewing medical records, interviewing (CPT [phone]) [place] and administering and scoring neurocognitive tests (CPT [phone] & [phone]).  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   A full report will follow once the planned testing has been completed. His next appointment is scheduled for 08/13/15.   Jamey Ripa, Ph.D Licensed Psychologist 08/08/2015

## 2015-08-13 ENCOUNTER — Encounter: Payer: Self-pay | Admitting: Psychology

## 2015-08-13 ENCOUNTER — Ambulatory Visit (INDEPENDENT_AMBULATORY_CARE_PROVIDER_SITE_OTHER): Payer: Medicare Other | Admitting: Psychology

## 2015-08-13 DIAGNOSIS — R413 Other amnesia: Secondary | ICD-10-CM

## 2015-08-13 DIAGNOSIS — F601 Schizoid personality disorder: Secondary | ICD-10-CM

## 2015-08-13 DIAGNOSIS — F341 Dysthymic disorder: Secondary | ICD-10-CM

## 2015-08-13 DIAGNOSIS — R41840 Attention and concentration deficit: Secondary | ICD-10-CM

## 2015-08-13 NOTE — Progress Notes (Addendum)
[facility]  [address]   Telephone [phone] [address] Fax [phone] [place], [place] [address]   NEUROPSYCHOLOGICAL EVALUATION  *CONFIDENTIAL* This report should not be released without the consent of the client  Name:   Randy Grimes Date of Birth:  29-Jul-2050 [facility] MR#:  [id number] Dates of Evaluation: 08/07/15 & 08/13/15  Reason for Referral Randy Grimes is a 65 year old right-handed man who was referred for neuropsychological evaluation by C. Lesia Sago, MD of [facility]. [name] has complained of both recent and autobiographical memory loss going back decades without significant progression over time. 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  Sources of Information Electronic medical records from the [facility] were reviewed. [name] was interviewed.   Chief Complaints & Current Status  [name] reported that he has had trouble recalling past life events as well as recent events for "decades". He stated his belief that his memory has neither worsened nor improved over time. With regards to his autobiographical memory, he stated that he has virtually no recall of his life from childhood through adulthood. The only event he could recall from his childhood was breaking his leg at the age of [age]. For example, he reported no memory of the births of any of his children, when he met his wife, a hospitalization in 2011 or the address of the home he had lived in for many years. He reported that he has not even recognized co-workers with whom he had worked with a few years ago. He has never had difficulty recognizing family members, however. He reported problems with recent memory exemplified by not recalling  what he had for dinner the previous night, the names of people he has met several times and when his appointments are scheduled (he has been relying on a calendar for many years). He did not report any problems with remembering procedures or sequences.  In addition to memory difficulties, he stated that since he was a young adult he has never felt able to communicate with others. He stated "I don't know how to talk to people.. I have nothing to say". He reported that his wife separated from him in February 2017 because "I made her miserable because I couldn't communicate". He reported that he has never had friends. The only emotional connection he has felt has been to his wife. He reported that for as long as he can remember he has experienced an ongoing sense of emotional detachment, indifference to people not in his family, little interest in social relationships or leisure activities, difficulty expressing either positive or negative emotions and a desire to be solitary. He did not report feeling uncomfortable in social situations just disinterested.    He did not report feeling depressed or anxious. He specifically denied sad mood, feelings of worthlessness or hopelessness, passive or active suicidal thoughts, mania, hallucinations and delusions. He did not report any ongoing stressors. When asked how he was feeling about his recent marital separation he said "It's okay".   With regards to his physical functioning, he reported experiencing chronic pain in his lower back, left hip and left ankle since he was injured in a fall in 1987. He rated his current level of pain as usually a seven  out of ten on a subjective scale. He reported having trouble falling asleep most nights though adequate daytime energy level. He denied problems with gait, eye-hand coordination, balance, limb strength, vision, swallowing, speech, appetite, smell or taste.   He did not report having any problems performing his basic or  instrumental activities of daily living. He reported spending most of his time working in his garden, playing with model trains or watching television.  Background Medical History His past medical history was notable for allergic rhinitis, gastroesophageal reflux disease and morbid obesity. He has a history of chronic pain in his hips, back, knees and left ankle since a work-related fall in 1987. He underwent and back surgery in 1994 and a gastric sleeve resection procedure in 2014.   He did not report any unusual childhood illness or injuries. He reported that he achieved his developmental milestones within normal timeframes. He did not report having experienced inattentiveness, learning difficulties or hyperactive behavior as a child. He denied history of head injury, seizure activity, stroke-like symptoms, neurological infection or exposure to neurotoxic substances. He reported no history of alcohol abuse, use of illicit drugs or use of tobacco products.  Mental Health History He has been under outpatient psychiatric treatment for several years, by his report due to "communication problems". Records indicated that he has been diagnosed with Major Depression. He acknowledged that he became somewhat depressed sometime after he developed chronic pain from injuries in a fall in 1987. In 2011 he was hospitalized after taking an overdose of his pain medications that was presumed accidental. Subsequent problems included altered mental status, acute renal failure and ventilator-dependent respiratory failure. After a two day hospital stay, he was transferred to [facility] due to his report of auditory hallucinations and thought blocking. He was discharged after three days with diagnoses of delirium secondary to medication and history of Depressive disorder not otherwise specified. He reported no other history of psychiatric hospitalization. He had been taking Cymbalta and Abilify as recently as 2016 but  has since decided not refill those prescriptions. He stated his belief that use of psychiatric medication has never had any effects on his mood or behavior. He reported no history of anxiety, dissociative episodes, mania or psychotic symptoms. He reported no history of exposure to trauma or of having been a victim of abuse.   He recently consulted with a psychiatrist, Norma Fredrickson MD, on his own initiative to seek help by his report for "communication problems".  Review of Dr. 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   He reported never having been in psychotherapy.   Medications His current medications include diclofenac gel, ibuprofen as needed and oxycodone (30 mg. four times a day).  Family Medical History He reported that a niece has been diagnosed with Bipolar disorder. He was not aware of anyone in his family having had memory problems or dementia.  Social History Mr. Martenson lives alone. He and his wife of over 45 years separated in February 2017 at her request. They have three children, one of whom was adopted. He reported that their adopted child has been incarcerated for drug-related criminal behavior.   Educational/Occupational History  He reported that he earned poor grades until the 11th grade when he decided to put more effort into his studies and thereafter earned good to excellent grades. He reported no history of special education services or retention in grade. After he was graduated  from high school, he attended community college for one year to study truck and Stage manager. He dropped out to get a job.   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 He has been on disability since 2007 presumably on the  basis of chronic pain.   Observations He appeared as an appropriately dressed and groomed overweight man in no apparent distress. He walked with a limp. He did not display any unusual motor activity or mannerisms. He interacted in a pleasant and cooperative manner though his social skills were not the best. His affect appeared blunted though at times was reactive. He did not display signs of emotional distress. He spoke in a normal tone of voice, maintained good eye contact and responded to all questions. No problems were evident for speech articulation, prosody, word finding, word selection, message coherence or language comprehension. His thought processes were coherent and organized without loose associations, verbal perseverations or flight of ideas. His ability to recall details of past life events was inconsistent. For example he could recall the names and locations of the high school and college he attended but not the name of the company he worked for. His thought content was devoid of unusual or bizarre ideas.  Evaluation Procedures In addition to review of medical records and interviews, the following tests or questionnaires were administered:   Animal Naming Test [place] Naming Test Brief Symptom Inventory      Conners' Continuous Performance Test II       Controlled Oral Word Association Test      Finger Tapping Test Modified [place] Card Sorting Test  Rey 15-Item Memory Test Rey Complex Figure: copy Stroop Color & Word Test  Test of Memory Malingering Trail Making A & B     Wechsler Adult Intelligence Scale-IV:  Music therapist, Coding, Digit Span & Similarities      Wechsler Memory Scale-IV      Wide Range Achievement Test-4: Word Reading  Test Results & Interpretation Validity & Interpretive Considerations Test results were deemed to represent a valid measure of his current cognitive functioning. He was cooperative throughout the testing process. He reported that his pain level  while seated was 7 out of 10 on a subjective scale though he did not display signs of physical discomfort or pain behavior. He did not report or display problems with vision (he wore his eyeglasses), hearing or motor control. He had no apparent problems understanding task instructions. He appeared to maintain attention and persist to task. He did not display signs of impulsive, careless or perseverative responding. His level of effort was considered optimal based on observations as well as results of formal performance validity measures. He passed performance validity tests that required immediate recognition of drawings of common objects from a set of two possibilities (Test of Memory Malingering) and immediate recall of fifteen symbols that can be easily accomplished due to the redundancy amongst the items (Rey 15-Item Memory Test).  His pre-morbid intellectual potential was estimated to fall within the lower end of the Average range based on his educational level and occupational background coupled with a measure of word reading skill (Wide Range Achievement Test-4).  His test scores were corrected to reflect norms for his age and whenever possible his educational level (i.e., 13 years). A listing of his test scores can be found at the  end of this report.  Speed of Processing & Attention Speed of processing was generally within normal limits based on his speed to detect targets on a sustained attention task (Conners Continuous Performance Test-II), transcribe symbols to match digits using a key (Wechsler Adult Intelligence Scale-IV (WAIS-IV) Coding), draw lines to connect numbers arrayed on a page in numerical sequence (Trails A) and read words or name color hues (Stroop Test).   He demonstrated difficulties on tests of attention. On a test of sustained visual attention that required detecting target letters flashed on a screen (Conners Continuous Performance Test-II), he responded to most all targets  though made a large number of commission errors (suggesting an impulsive response style) and responded increasingly slowly and considerably less consistently as the test progressed (indicating a problem maintaining attention). He performed within the Borderline range on a timed visual-motor test that required visual scanning and set shifting (Trails B). He performed within the impaired range on a test of immediate attention span and short-term auditory memory (WAIS-IV Digit Span) that required repeating digit sequences in forward, reverse or ascending order. In contrast, he performed within the Average range on visual working memory tests that required immediately recognizing series of symbols in left to right order (Wechsler Memory Scale-IV (WMS-IV) Symbol Span) or immediately recalling spatial locations of visual stimuli within a grid (WMS-IV Spatial Span).   Executive Function His performances on tests of higher cognitive functioning were for the most part within normal expectations. The only exception was as noted above his slow rate to complete a visual-motor test that required visual scanning while shifting set (Trails B). Of note, he deviated from the correct sequence three times. In contrast, his efficiency in naming the print color of a word while simultaneously ignoring the conflicting word (Stroop Color & Word Test), which requires selective attention and response inhibition, fell within the Average range. Measures of verbal fluency based on his generation of words to letter prompts (Controlled Oral Word Association Test) or members of a category (Animal Naming Test) fell within the Low Average range. His performance on an abstract reasoning test that required the verbal classification of ostensibly different objects or ideas to a shared category (WAIS-IV Similarities) fell within the Average range. He performed within the High Average range on a test of nonverbal problem-solving and conceptual  flexibility that required inferring logical ways to sort geometric designs [employer]) as he completed all six categories, maintained set and efficiently shifted to a new concept in response to feedback.   Learning & Memory A measure of his ability to immediately recall verbal and visual information (WMS-IV Immediate Memory Index) fell within the Low Average range at the 19th percentile. His immediate memory subtest scores were either within the Average or Low Average ranges. A measure of his delayed memory (WMS-IV Delayed Memory Index), as assessed after a 20 to 30 minute delay, fell within the Low Average range at the 18th percentile. His Delayed Memory Index was as expected given his Immediate Memory Index, which indicated normal retention over the delay interval of information that he had initially learned.   Language A measure of confrontational naming [facility] Naming Test) was within the Average range. His phonemic fluency (Controlled Oral Association Test) fell within the Low Average range. His word reading skill (Wide Range Achievement Test-4) fell at the lower end of the Average range.  Visual Perceptual & Visuospatial Construction There were no signs of spatial inattention or problems with visual recognition. His score on  a visual-spatial organizational task that required assembly of two-dimensional block designs from models Product manager) was within the Average range. His copy of a spatially-complex geometric design (Rey Complex Figure) was normal.   Motor He demonstrated consistent right hand preference. He showed no signs of tremor or gross disscoordination. His fine motor speed (Finger Tapping) was within the Average range for both hands. He showed the expected pattern of faster dominant hand fine motor speed.   Emotional Status The Brief Symptom Inventory (BSI) is a 53-item self-report questionnaire listing symptoms of somatic and psychological distress. He  reported an average overall level of current psychological distress. Analysis of BSI subscales did not show any elevations of clinical significance although his score on the Depression subscale was above average. Examination of individual items revealed that his two most distressing symptoms were "trouble falling sleep" and "trouble remembering things".   Summary & Conclusions Adley Mazurowski is a 65 year-old man who reported decades long problems with both recent and autobiographical memory. In addition, he reported longtime difficulty communicating his thoughts and feelings. He has received psychiatric treatment for depression over the past few years.  His neuropsychological profile was noteworthy for problems with attention evident on tests of sustained visual attention, short-term auditory memory and visual scanning/set shifting. Despite his uneven attentional skills, his ability to learn both auditory and visual information was normal (i.e., Average to Low Average). Moreover, measures of memory retention, language, visual-spatial organization, visuoconstruction, fine motor speed and conceptual reasoning were also within normal expectations and commensurate with his estimated cognitive potential.  With regards to his psychological functioning, he did not report significant psychological distress either verbally or on a standardized questionnaire. At most he experiences mild depressive symptoms. He reported that for as long as he can remember he has experienced a persistent sense of emotional detachment, indifference to people not in his family, little interest in social relationships or leisure activities, difficulty expressing either positive or negative emotions and a desire to be solitary. 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 It is likely that his schizoid personality features are at times misinterpreted by others as depression.   In conclusion, attentional deficiency is a  non-specific finding that can be caused by a variety of developmental, psychological, medical and neurological conditions. In his case, possible causes might include 1) instances of detachment associated with indifference 2) distraction from chronic pain 3) side effects of high dose opioid medication 4) depression and 5) multiple lacunar infarctions in the frontal lobes. His complaint of poor autobiographical memory is probably secondary to a psychological cause given that he has no history of seizure disorder or severe traumatic brain injury. It may be that his inability to access or recall past or recent life events is a by-product of his schizoid personality. That is, he does not typically experience much if any emotion tied to life events thereby making them more difficult to consolidate into or later access from memory.   The results and conclusions from this evaluation were discussed with Mr. Gosse on 08/13/15. He had no follow-up questions. He was encouraged to try and associate life events with an emotional reaction to better consolidate them into memory. Psychotherapy might be a treatment option though it is not clear whether he is interested in changing his behavior or if he could tolerate the process. He gave consent for this report to be released to his current psychiatrist.    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 Ahlers. Please feel free to contact me with any comments or questions.    ______________________ Jamey Ripa, Ph.D Licensed Psychologist       Copies to: Jill Alexanders, MD   [facility]    Norma Fredrickson, MD   [place]      ADDENDUM-NEUROPSYCHOLOGICAL TEST RESULTS Animal Naming Test Score= 16 21st  (adjusted for age, gender and  educational level)   [place] Naming Test Score= [phone] 63rd (adjusted for age, gender  and educational level)   Conners' Continuous Performance Test II (selected measures) Measure Guideline  Omissions Good performance  Commissions Mildly atypical  Hit Reaction Time Within average range    Detectability  (d') Within average range    Perseverations Good performance    Hit Reaction Time Block Change Mildly atypical     Hit Standard Error Block Change Markedly atypical       Controlled Oral Word Association Test Score= 30 words/2 repetitions 21st (adjusted for age, gender and educational level)   Finger Tapping R: 41 50th (adjusted for age, gender and educational level)  L:  40 68th  (adjusted for age, gender and educational level)   Modified [place] Card Sorting Test  Categories correct       6 66th (adjusted for age and education)  Perseverative errors      0 88th              Total errors       3 76th        Executive function composite    113 81st          Rey 15-Item Memory Test  Score=10/15 Acceptable   Rey Complex Figure: copy  Score= 34/36 Normal    Stroop Color Word Test  Score Residual % (adjusted for age and educational level)  Word   92 -7 32nd        Color   69 -4 37th       Color-Word   33 -2 45th        Test of Memory Malingering Trial 1= 47/50   Trial 2= 50/50 Normal   Trails A Score= 39s 2e 16th (adjusted for age, gender and educational level)  Trails B Score= 126s 3e  6th (adjusted for age, gender and educational level)   Wechsler Adult Intelligence Scale-IV  Subtest Scaled Score Percentile  Block Design 11 63rd      Similarities   9 37th    Digit Span  Forward                Backward                Sequencing   '3   5   3    5   '$ 1st               5th     1st        5th      Coding   10 50th       Wechsler Memory Scale-IV Index Index Score Percentile  Immediate Memory   87 19th        Auditory Memory   88 21st       Visual Memory   2  23rd        Delayed Memory   86 18th  Visual Working Memory  100 50th         [facility] Achievement Test-4 Subtest  Raw score Standard score Percentile  Word Reading 56/70 91 27th

## 2015-08-15 ENCOUNTER — Telehealth: Payer: Self-pay | Admitting: Neurology

## 2015-08-15 NOTE — Telephone Encounter (Signed)
Results of neuropsychological testing are available:  The patient does not appear to have a progressive organic memory disorder. He may have a attention deficit disorder.  Summary & Conclusions Randy Grimes is a 65 year-old man who reported decades long problems with both recent and autobiographical memory. In addition, he reported longtime difficulty communicating his thoughts and feelings. He has received psychiatric treatment for depression over the past few years.  His neuropsychological profile was noteworthy for problems with attention evident on tests of sustained visual attention, short-term auditory memory and visual scanning/set shifting. Despite his uneven attentional skills, his ability to learn both auditory and visual information was normal (i.e., Average to Low Average). Moreover, measures of memory retention, language, visual-spatial organization, visuoconstruction, fine motor speed and conceptual reasoning were also within normal expectations and commensurate with his estimated cognitive potential.  With regards to his psychological functioning, he did not report significant psychological distress either verbally or on a standardized questionnaire. At most he experiences mild depressive symptoms. He reported that for as long as he can remember he has experienced a persistent sense of emotional detachment, indifference to people not in his family, little interest in social relationships or leisure activities, difficulty expressing either positive or negative emotions and a desire to be solitary. 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 It is likely that his schizoid personality features are at times misinterpreted by others as depression.   In conclusion, attentional deficiency is a non-specific finding that can be caused by a variety of developmental, psychological, medical and neurological conditions. 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 His complaint of poor autobiographical memory is probably secondary to a psychological cause given that he has no history of seizure disorder or severe traumatic brain injury. It may be that his inability to access or recall past or recent life events is a by-product of his schizoid personality. That is, he does not typically experience much if any emotion tied to life events thereby making them more difficult to consolidate into or later access from memory.   The results and conclusions from this evaluation were discussed with Randy Grimes on 08/13/15. He had no follow-up questions. He was encouraged to try and associate life events with an emotional reaction to better consolidate them into memory. Psychotherapy might be a treatment option though it is not clear whether he is interested in changing his behavior or if he could tolerate the process. He gave consent for this report to be released to his current psychiatrist.   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

## 2015-08-24 ENCOUNTER — Ambulatory Visit (INDEPENDENT_AMBULATORY_CARE_PROVIDER_SITE_OTHER): Payer: Medicare Other | Admitting: Psychiatry

## 2015-08-24 VITALS — BP 148/84 | HR 57 | Ht 72.0 in | Wt 300.8 lb

## 2015-08-24 DIAGNOSIS — F341 Dysthymic disorder: Secondary | ICD-10-CM | POA: Diagnosis not present

## 2015-08-24 NOTE — Progress Notes (Signed)
Patient ID: Randy Grimes, male   DOB: [birth year], 65 y.o.   MRN: [id number]  Psychiatric Initial Adult Assessment   Patient Identification: Randy Grimes MRN:  [id number] Date of Evaluation:  08/24/2015 Referral Source: Self-referred Chief Complaint:   poor concentration and cannot remember Visit Diagnosis major depression, chronic, mild Today this patient was seen alone. His wife Randy Grimes did not come. Today reviewed his neuropsychological assessment done from Dr. Tobey Grim office. Today again we clarify that the patient has no significant distress. He claims that everything stayed as is that we find with him with the possible exception of getting back with his wife Randy Grimes. He understands that he is a problem communicating. Based upon the neuropsych testing is due to probably multiple factors. 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 The patient denies persistent daily paralyzed and depression. He denies any problems with sleep or appetite. His energy level is good. He enjoys the television listening to music but does not really. He enjoys model trains. He denies auditory or visual hallucinations. He functions very well. He lives alone independently and does all his instrumental ADLs. He continues to drive without problems. The patient takes oxycodone for chronic back pain. The patient denies suicidal or homicidal ideation. The patient's wishes to ultimately get back with his wife in marital treatment should be consideration for this. At this time I find no evidence of a major mood disorder. It is noted that 7 years ago he was psychiatrically hospitalized but is not clear why. Also clearly was being treated by psychiatrist in [facility] for depression but none of the antidepressants were beneficial. The patient denies any anxiety symptoms. Is very passive. He appears comfortable contented.  Associated  Signs/Symptoms: Depression Symptoms:  depressed mood, (Hypo) Manic Symptoms:   Anxiety Symptoms:   Psychotic Symptoms:   PTSD Symptoms:   Past Psychiatric History:   Previous Psychotropic Medications: Yes   Substance Abuse History in the last 12 months:  No.  Consequences of Substance Abuse: Past Medical History:  Past Medical History  Diagnosis Date  . Acute kidney failure, unspecified ([place])   . Allergic rhinitis, cause unspecified   . Other chronic pain   . Unspecified pruritic disorder   . Hordeolum externum     Stye in R lower eyelid  . Obesity, Class III, BMI 40-49.9 (morbid obesity) ([place])   . Arthritis   . Chronic back pain   . GERD (gastroesophageal reflux disease)     takes Protonix daily  . History of gastric ulcer     x2  . GI bleeding 1989  . History of blood transfusion     no abnormal reaction noted  . Depressive disorder, not elsewhere classified     takes Cymbalta and Abilify daily  . Memory disorder 04/30/2015    Past Surgical History  Procedure Laterality Date  . Ankle surgery Left   . Back surgery    . Hand surgery Left   . Leg surgery Bilateral   . 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 Left testcle removed    . Vasectomy    . Tonsillectomy      as a child  . Tibia fracture surgery      multiple  . Spinal cord stimulator placed    . Spinal cord stimulator removed    . Nasal sinus surgery  03/26/2012    with fusion, x3  .  Sinus endo w/fusion  03/26/2012    Procedure: ENDOSCOPIC SINUS SURGERY WITH FUSION NAVIGATION;  Surgeon: Ruby Cola, MD;  Location: [facility] OR;  Service: ENT;  Laterality: N/A;  LEFT ETHMOIDECTOMY; BILATERAL SPHENOIDECTOMY; LEFT MAXILLARY ANTROSTOMY WITH IMAGE GUIDANCE; REPAIR LEFT SPHENOID CSF LEAK; NASAL SEPTAL FLAP  . Abdominal fat graph  03/26/2012    Procedure: ABDOMINAL FAT GRAPH;  Surgeon: Ruby Cola, MD;  Location: [facility] OR;  Service: ENT;  Laterality: N/A;  . Esophagogastroduodenoscopy N/A 05/27/2012    Procedure:  ESOPHAGOGASTRODUODENOSCOPY (EGD);  Surgeon: Shann Medal, MD;  Location: [facility];  Service: General;  Laterality: N/A;  . Fracture surgery      left leg as child  . Laparoscopic gastric sleeve resection N/A 08/10/2012    Procedure: LAPAROSCOPIC GASTRIC SLEEVE RESECTION;  Surgeon: Madilyn Hook, DO;  Location: [facility] ORS;  Service: General;  Laterality: N/A;  . Esophagogastroduodenoscopy N/A 08/10/2012    Procedure: ESOPHAGOGASTRODUODENOSCOPY (EGD);  Surgeon: Madilyn Hook, DO;  Location: [facility] ORS;  Service: General;  Laterality: N/A;    Family Psychiatric History:   Family History:  Family History  Problem Relation Age of Onset  . Asthma Other   . Depression Other   . Diabetes Other   . Heart disease Other   . Hypertension Other   . Stroke Other   . Stroke Father   . Dementia Neg Hx     Social History:   Social History   Social History  . Marital Status: Married    Spouse Name: N/A  . Number of Children: 5  . Years of Education: 13   Occupational History  . disabled    Social History Main Topics  . Smoking status: Never Smoker   . Smokeless tobacco: Never Used  . Alcohol Use: No     Comment: rare  . Drug Use: No  . Sexual Activity: Not on file   Other Topics Concern  . Not on file   Social History Narrative   Has been on disability since 2006   Never smoker or drinker   Curtrently lives in [place] with wife   Patient drinks caffeine occasionally.   Patient is right handed.     Additional Social History:   Allergies:  No Known Allergies  Metabolic Disorder Labs: Lab Results  Component Value Date   HGBA1C 6.2* 11/26/2012   MPG 131* 11/26/2012   MPG 128* 08/11/2012   No results found for: PROLACTIN Lab Results  Component Value Date   CHOL 259* 03/01/2012   TRIG 213* 03/01/2012   HDL 42 03/01/2012   CHOLHDL 6.2 03/01/2012   VLDL 43* 03/01/2012   LDLCALC 174* 03/01/2012     Current Medications: Current Outpatient Prescriptions  Medication  Sig Dispense Refill  . ALPRAZolam (XANAX) 0.5 MG tablet Take 2 tablets 45 minutes before scheduled MRI, take a third tablet if needed 3 tablet 0  . diclofenac sodium (VOLTAREN) 1 % GEL Apply 2 g topically at bedtime.     Marland Kitchen ibuprofen (ADVIL,MOTRIN) 200 MG tablet Take 800 mg by mouth every 6 (six) hours as needed for moderate pain.    Marland Kitchen oxycodone (ROXICODONE) 30 MG immediate release tablet Take 30 mg by mouth 4 (four) times daily.   0   No current facility-administered medications for this visit.    Neurologic: Headache: No Seizure: No Paresthesias:No  Musculoskeletal: Strength & Muscle Tone: within normal limits Gait & Station: normal Patient leans: N/A  Psychiatric Specialty Exam: ROS  Blood pressure 148/84, pulse 57,  height 6' (1.829 m), weight 300 lb 12.8 oz (136.442 kg).Body mass index is 40.79 kg/(m^2).  General Appearance: Disheveled  Eye Contact:  Good  Speech:  Clear and Coherent  Volume:  Normal  Mood:  Depressed  Affect:  Appropriate  Thought Process:  Coherent  Orientation:  Full (Time, Place, and Person)  Thought Content:  WDL  Suicidal Thoughts:  No  Homicidal Thoughts:  No  Memory:  NA  Judgement:  NA  Insight:  Good  Psychomotor Activity:  Normal  Concentration:  Poor  Recall:  Poor  Fund of Knowledge:Good  Language: Fair  Akathisia:  No  Handed:  Right  AIMS (if indicated):    Assets:  Desire for Improvement  ADL's:  Intact  Cognition: WNL  Sleep:      Treatment Plan Summary: At this time I do not believe this patient has a major mental illness. He has multiple factors that could affect his ability to communicate. One psychiatric problem is mostly that of schizoid personality. He describes a number of the features of this personality disorder that he's had for nearly all his life. His issues of attention focus concentration and excitement about life are due to multiple back factors none of which are treatable. The key is this patient appears to have  no distress no complaints and is only here because he wants to get back with his wife. I suspect that he had a psychiatric condition he be willing to take any medications for it. At this time I find no specific psychiatric illnesses which are treatable by medications. I suggested to the patient that he consider marital treatment with his wife. He is not suicidal not homicidal and actually is quite stable this will be his last visit. The patient was told that he could always call back if things changed but we will not schedule another appointment.  Haskel Schroeder, MD 5/26/201711:19 AM

## 2015-10-01 ENCOUNTER — Ambulatory Visit: Payer: Medicare Other | Admitting: Neurology

## 2015-10-01 ENCOUNTER — Telehealth: Payer: Self-pay

## 2015-10-01 NOTE — Telephone Encounter (Signed)
Pt no-showed follow-up appt this morning.

## 2015-10-03 ENCOUNTER — Encounter: Payer: Self-pay | Admitting: Neurology

## 2016-01-29 ENCOUNTER — Encounter (HOSPITAL_COMMUNITY): Payer: Self-pay

## 2017-03-11 ENCOUNTER — Encounter (HOSPITAL_COMMUNITY): Payer: Self-pay

## 2017-07-22 ENCOUNTER — Ambulatory Visit (INDEPENDENT_AMBULATORY_CARE_PROVIDER_SITE_OTHER): Payer: Medicare Other

## 2017-07-22 ENCOUNTER — Encounter: Payer: Self-pay | Admitting: Family Medicine

## 2017-07-22 ENCOUNTER — Ambulatory Visit (INDEPENDENT_AMBULATORY_CARE_PROVIDER_SITE_OTHER): Payer: Medicare Other | Admitting: Family Medicine

## 2017-07-22 ENCOUNTER — Other Ambulatory Visit: Payer: Self-pay

## 2017-07-22 VITALS — BP 142/98 | HR 61 | Temp 98.5°F | Ht 72.0 in | Wt 311.0 lb

## 2017-07-22 DIAGNOSIS — I10 Essential (primary) hypertension: Secondary | ICD-10-CM

## 2017-07-22 DIAGNOSIS — Z8709 Personal history of other diseases of the respiratory system: Secondary | ICD-10-CM

## 2017-07-22 DIAGNOSIS — R109 Unspecified abdominal pain: Secondary | ICD-10-CM | POA: Diagnosis not present

## 2017-07-22 DIAGNOSIS — E119 Type 2 diabetes mellitus without complications: Secondary | ICD-10-CM | POA: Diagnosis not present

## 2017-07-22 DIAGNOSIS — R51 Headache: Secondary | ICD-10-CM | POA: Diagnosis not present

## 2017-07-22 DIAGNOSIS — G894 Chronic pain syndrome: Secondary | ICD-10-CM | POA: Diagnosis not present

## 2017-07-22 DIAGNOSIS — R05 Cough: Secondary | ICD-10-CM

## 2017-07-22 DIAGNOSIS — R053 Chronic cough: Secondary | ICD-10-CM

## 2017-07-22 DIAGNOSIS — D649 Anemia, unspecified: Secondary | ICD-10-CM | POA: Diagnosis not present

## 2017-07-22 DIAGNOSIS — R351 Nocturia: Secondary | ICD-10-CM

## 2017-07-22 DIAGNOSIS — R519 Headache, unspecified: Secondary | ICD-10-CM

## 2017-07-22 DIAGNOSIS — Z23 Encounter for immunization: Secondary | ICD-10-CM | POA: Diagnosis not present

## 2017-07-22 DIAGNOSIS — D509 Iron deficiency anemia, unspecified: Secondary | ICD-10-CM | POA: Diagnosis not present

## 2017-07-22 DIAGNOSIS — G8929 Other chronic pain: Secondary | ICD-10-CM | POA: Insufficient documentation

## 2017-07-22 HISTORY — DX: Personal history of other diseases of the respiratory system: Z87.09

## 2017-07-22 HISTORY — DX: Headache, unspecified: R51.9

## 2017-07-22 LAB — CBC WITH DIFFERENTIAL/PLATELET
BASOS ABS: 0 10*3/uL (ref 0.0–0.1)
Basophils Relative: 0.6 % (ref 0.0–3.0)
EOS ABS: 0.2 10*3/uL (ref 0.0–0.7)
Eosinophils Relative: 2.4 % (ref 0.0–5.0)
HCT: 33.5 % — ABNORMAL LOW (ref 39.0–52.0)
Hemoglobin: 10.9 g/dL — ABNORMAL LOW (ref 13.0–17.0)
Lymphocytes Relative: 23.5 % (ref 12.0–46.0)
Lymphs Abs: 1.5 10*3/uL (ref 0.7–4.0)
MCHC: 32.7 g/dL (ref 30.0–36.0)
MCV: 74.9 fl — ABNORMAL LOW (ref 78.0–100.0)
Monocytes Absolute: 0.3 10*3/uL (ref 0.1–1.0)
Monocytes Relative: 4.3 % (ref 3.0–12.0)
Neutro Abs: 4.4 10*3/uL (ref 1.4–7.7)
Neutrophils Relative %: 69.2 % (ref 43.0–77.0)
Platelets: 248 10*3/uL (ref 150.0–400.0)
RBC: 4.47 Mil/uL (ref 4.22–5.81)
RDW: 17.6 % — ABNORMAL HIGH (ref 11.5–15.5)
WBC: 6.3 10*3/uL (ref 4.0–10.5)

## 2017-07-22 LAB — COMPREHENSIVE METABOLIC PANEL
ALK PHOS: 68 U/L (ref 39–117)
ALT: 15 U/L (ref 0–53)
AST: 12 U/L (ref 0–37)
Albumin: 3.5 g/dL (ref 3.5–5.2)
BUN: 11 mg/dL (ref 6–23)
CO2: 29 mEq/L (ref 19–32)
CREATININE: 0.76 mg/dL (ref 0.40–1.50)
Calcium: 8.4 mg/dL (ref 8.4–10.5)
Chloride: 103 mEq/L (ref 96–112)
GFR: 109.15 mL/min (ref >60.00)
GLUCOSE: 100 mg/dL — AB (ref 70–99)
Potassium: 4.1 mEq/L (ref 3.5–5.1)
SODIUM: 137 meq/L (ref 135–145)
TOTAL PROTEIN: 6.2 g/dL (ref 6.0–8.3)
Total Bilirubin: 0.5 mg/dL (ref 0.2–1.2)

## 2017-07-22 LAB — POCT URINALYSIS DIPSTICK
Blood, UA: NEGATIVE
GLUCOSE UA: NEGATIVE
Nitrite, UA: NEGATIVE
Spec Grav, UA: 1.025 (ref 1.010–1.025)
Urobilinogen, UA: 0.2 E.U./dL
pH, UA: 6 (ref 5.0–8.0)

## 2017-07-22 LAB — TSH: TSH: 2.04 u[IU]/mL (ref 0.35–4.50)

## 2017-07-22 LAB — LIPID PANEL
CHOL/HDL RATIO: 5
Cholesterol: 217 mg/dL — ABNORMAL HIGH (ref 0–200)
HDL: 44.4 mg/dL (ref >39.00)
LDL CALC: 152 mg/dL — AB (ref 0–99)
NonHDL: 172.5
Triglycerides: 103 mg/dL (ref 0.0–149.0)
VLDL: 20.6 mg/dL (ref 0.0–40.0)

## 2017-07-22 LAB — HEMOGLOBIN A1C: Hgb A1c MFr Bld: 6.8 % — ABNORMAL HIGH (ref 4.6–6.5)

## 2017-07-22 MED ORDER — CETIRIZINE HCL 10 MG PO TABS: 10.0000 mg | ORAL_TABLET | Freq: Every day | ORAL | 11 refills | Status: DC

## 2017-07-22 MED ORDER — OMEPRAZOLE 20 MG PO CPDR: 20.0000 mg | DELAYED_RELEASE_CAPSULE | Freq: Every day | ORAL | 1 refills | Status: AC

## 2017-07-22 MED ORDER — FLUTICASONE PROPIONATE 50 MCG/ACT NA SUSP: 2.0000 | Freq: Every day | NASAL | 6 refills | Status: DC

## 2017-07-22 MED ORDER — METOPROLOL TARTRATE 25 MG PO TABS: 25.0000 mg | ORAL_TABLET | Freq: Two times a day (BID) | ORAL | 3 refills | Status: AC

## 2017-07-22 NOTE — Patient Instructions (Signed)
Please return in 6-8 weeks for recheck on your symptoms.   Please go to our [facility] office to get your xrays done. You can walk in M-F between 8am and 5pm. Tell them you are there for xrays ordered by me. They will send me the results, then I will let you know the results with instructions.   Address: [place], [place], [place]  (office sits at [address] at [address] intersection; from here, turn left onto [place] [address]), take to [address], turn right and go for a mile or so, office will be on left across form [employer] )  We will call you with information regarding your referral appointment. Headache specialist to help manage your headaches. Please try to limit your Ibuprofen use.   Please start the metoprolol twice a day for your blood pressure, the zyrtec and flonase for possible allergies contributing to your cough, and the prilosec for possible GERD related cough.   It was a pleasure meeting you today! Thank you for choosing [place] to meet your healthcare needs! I truly look forward to working with you. If you have any questions or concerns, please send me a message via Mychart or call the office at [phone].

## 2017-07-22 NOTE — Progress Notes (Signed)
Subjective  CC:  Chief Complaint  Patient presents with  . Establish Care    Transfer from [facility], Dr Marlyn Corporal, Last Phys-4 years ago. Just moved back here from [place]   . Back Pain    x 3 months  . Hip Pain  . Cough    x 3 months, non-productive     HPI: Randy Grimes is a 67 y.o. male who presents to [place] at [facility] today to establish care with me as a new patient. I have reviewed extensive records from his medical chart and documented history below.   This is a 67 yo disabled male due to a traumatic fall decades ago causing back problems and chronic pain. He also suffered from a mood disorder: depression +/- other problems. He was formerly managed by a pain clinic on chronic opioids and by psych on antidepressants that were never helpful. He has also been evaluated by neurology for memory problems. He suffered two major acute hospitalizations: GI Bleed due to nsaid use and VDRF and ARF due to unintentional opioid OD.   Mood - documented depression, but also consideration of schizoid affective d/o, dysthymia. Memory concerns were due to multiple factors including pain med use. Pt reports his mood is now stable.   Since, he weaned off all meds, moved to [place] for medical marijuana, but did not get relief. He moved back to  [place], reunited with his wife and family (3 small children adopted (grandkids, other family member)) in February. Transitioning well.   HM is overdue.   He has the following concerns or needs:  Right flank pain x 1-2 months; dull aching associated with nocturia. Worries about his kidneys. No gross hematuria. No f/c/s, trauma. Pain is different from chronic low back pain. No abdominal pain. No h/o renal stones.   Persistent cough x 2 months: coughing fits, improving. Denies allergy sxs, PND, GERD sxs, breathing problems. Has h/o sinus disease; allergy meds not helpful in past.   Chronic headaches: takes  1000mg  ibuprofen bid! H/o GI bleed! Denies melena. No n/v, reflux sxs. No h/o migraines. MRI in 2017 with old lacunar infarcts.   HTN: documented in past. Not treated. No cp or sob or le edema. No known heart disease.   Morbidly obese s/p gastric bypass.   We updated and reviewed the patient's past history in detail and it is documented below.  Patient Active Problem List   Diagnosis Date Noted  . Major depression, recurrent ([place]) 04/04/2013    Priority: High  . Chronic pain 11/26/2012    Priority: High    Secondary to fall from 30' in 80s. Has been treated by pain clinic in past. Now manages with CBD oil and high dose ibuprofen.   . Morbid obesity ([place]) 11/26/2012    Priority: High  . History of GI bleed - PUD 1989 09/04/2012    Priority: High  . Essential hypertension 08/11/2012    Priority: High  . Lumbar post-laminectomy syndrome 05/26/2012    Priority: High  . Chronic headache 07/22/2017    Priority: Medium    "sinus" s/p sinus surgery with minimal relief.   . History of acute respiratory failure 07/22/2017    Priority: Medium    Due to unintentional OD (chronic opioids) and pna: 2011 ARF due to rhabdo   . Memory disorder 04/30/2015    Priority: Medium  . 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 Lilyan Punt 08/11/2012    Priority: Medium  . Posttraumatic stress  disorder 09/04/2012  . Pain syndrome, chronic 04/22/2012   Health Maintenance  Topic Date Due  . Hepatitis C Screening  April 07, 1950  . PNA vac Low Risk Adult (1 of 2 - PCV13) 10/20/2016  . INFLUENZA VACCINE  10/29/2017  . COLONOSCOPY  12/30/2023  . TETANUS/TDAP  06/29/2025  . HIV Screening  Completed   Immunization History  Administered Date(s) Administered  . Meningococcal Polysaccharide 03/27/2012  . Pneumococcal Conjugate-13 07/22/2017  . Pneumococcal Polysaccharide-23 03/27/2012   Current Meds  Medication Sig  . ibuprofen (ADVIL,MOTRIN) 200 MG tablet Take 800 mg by mouth every 6 (six) hours as  needed for moderate pain.    Allergies: Patient has No Known Allergies. Past Medical History Patient  has a past medical history of Allergy, Arthritis, Chronic back pain, Chronic headache (07/22/2017), Depression, GERD (gastroesophageal reflux disease), GI bleeding (1989), History of acute respiratory failure (07/22/2017), History of blood transfusion, History of gastric ulcer, Hypertension, Memory disorder (04/30/2015), and Obesity, Class III, BMI 40-49.9 (morbid obesity) ([place]). Past Surgical History Patient  has a past surgical history that includes Ankle surgery (Left); Back surgery; Hand surgery (Left); Leg Surgery (Bilateral); undescended testicle; left testcle removed; Vasectomy; Tonsillectomy; Tibia fracture surgery; spinal cord stimulator placed; spinal cord stimulator removed; Nasal sinus surgery (03/26/2012); Sinus endo w/fusion (03/26/2012); Abdominal fat graph (03/26/2012); Esophagogastroduodenoscopy (N/A, 05/27/2012); Fracture surgery; Laparoscopic gastric sleeve resection (N/A, 08/10/2012); and Esophagogastroduodenoscopy (N/A, 08/10/2012). Family History: Patient family history includes Asthma in his other; Depression in his other; Diabetes in his other; Drug abuse in his daughter; Heart disease in his other; Hypertension in his other; Stroke in his father and other. Social History:  Patient  reports that he has never smoked. He has never used smokeless tobacco. He reports that he has current or past drug history. Drug: Marijuana. He reports that he does not drink alcohol.  Review of Systems: Constitutional: negative for fever or malaise Ophthalmic: negative for photophobia, double vision or loss of vision Cardiovascular: negative for chest pain, dyspnea on exertion, or new LE swelling Respiratory: negative for SOB or wheezing Gastrointestinal: negative for abdominal pain, change in bowel habits or melena Genitourinary: negative for dysuria or gross hematuria Musculoskeletal: negative  for new gait disturbance or muscular weakness Integumentary: negative for new or persistent rashes Neurological: negative for TIA or stroke symptoms Psychiatric: negative for SI or delusions Allergic/Immunologic: negative for hives  Patient Care Team    Relationship Specialty Notifications Start End  Leamon Arnt, MD PCP - General Family Medicine  07/22/17   Himmelrich, Bryson Ha, RD (Inactive) Dietitian   04/23/11     Objective  Vitals: BP (!) 142/98   Pulse 61   Temp 98.5 F (36.9 C)   Ht 6' (1.829 m)   Wt (!) 311 lb (141.1 kg)   BMI 42.18 kg/m  General:  Well developed, well nourished, no acute distress  Psych:  Alert and oriented,normal mood and affect HEENT:  Normocephalic, atraumatic, non-icteric sclera, PERRL, oropharynx is without mass or exudate, supple neck without adenopathy, mass or thyromegaly Cardiovascular:  RRR without gallop, rub + 2/6 systolic murmur Respiratory:  Good breath sounds bilaterally, CTAB with normal respiratory effort Gastrointestinal: normal bowel sounds, soft, non-tender, no noted masses. No HSM, no epigastric ttp; exam limited by body habitus  Assessment  1. Right flank pain   2. Persistent cough   3. Morbid obesity (HCC) Chronic  4. Essential hypertension   5. Chronic pain syndrome   6. Chronic nonintractable headache, unspecified headache type  7. Nocturia   8. History of acute respiratory failure      Plan   Complicated PMH with new problems:   Flank pain- unclear if msk or internal: check labs, urine.  Cough- GERD vs allergies vs other. Check cxr, start meds: allergy meds and PPI. Recheck 6-8 weeks.   HTN- start bb and monitor bp and HR. May help headaches.   Headaches- need to stop motrin before another UGI bleed: refer to headache specialist  Chronic pain - managed by pt with cbd oil  Workup for DM due to nocturia  Obesity: check lipids and thyroid function.    Follow up:  Return in about 8 weeks (around 09/16/2017) for  recheck.  Commons side effects, risks, benefits, and alternatives for medications and treatment plan prescribed today were discussed, and the patient expressed understanding of the given instructions. Patient is instructed to call or message via MyChart if he/she has any questions or concerns regarding our treatment plan. No barriers to understanding were identified. We discussed Red Flag symptoms and signs in detail. Patient expressed understanding regarding what to do in case of urgent or emergency type symptoms.   Medication list was reconciled, printed and provided to the patient in AVS. Patient instructions and summary information was reviewed with the patient as documented in the AVS. This note was prepared with assistance of Dragon voice recognition software. Occasional wrong-word or sound-a-like substitutions may have occurred due to the inherent limitations of voice recognition software  Orders Placed This Encounter  Procedures  . DG Chest 2 View  . Pneumococcal conjugate vaccine 13-valent  . CBC with Differential/Platelet  . Comprehensive metabolic panel  . Hepatitis C antibody  . Lipid panel  . TSH  . Hemoglobin A1c  . AMB referral to headache clinic  . POCT urinalysis dipstick   Meds ordered this encounter  Medications  . cetirizine (ZYRTEC) 10 MG tablet    Sig: Take 1 tablet (10 mg total) by mouth daily.    Dispense:  30 tablet    Refill:  11  . omeprazole (PRILOSEC) 20 MG capsule    Sig: Take 1 capsule (20 mg total) by mouth daily.    Dispense:  90 capsule    Refill:  1  . fluticasone (FLONASE) 50 MCG/ACT nasal spray    Sig: Place 2 sprays into both nostrils daily.    Dispense:  16 g    Refill:  6  . metoprolol tartrate (LOPRESSOR) 25 MG tablet    Sig: Take 1 tablet (25 mg total) by mouth 2 (two) times daily.    Dispense:  180 tablet    Refill:  3

## 2017-07-23 LAB — HEPATITIS C ANTIBODY
Hepatitis C Ab: NONREACTIVE
SIGNAL TO CUT-OFF: 0.03 (ref <1.00)

## 2017-07-24 ENCOUNTER — Other Ambulatory Visit: Payer: Self-pay | Admitting: *Deleted

## 2017-07-24 DIAGNOSIS — D509 Iron deficiency anemia, unspecified: Secondary | ICD-10-CM | POA: Diagnosis not present

## 2017-07-24 LAB — IRON,TIBC AND FERRITIN PANEL
%SAT: 12 % (calc) — ABNORMAL LOW (ref 15–60)
Ferritin: 10 ng/mL — ABNORMAL LOW (ref 20–380)
Iron: 54 ug/dL (ref 50–180)
TIBC: 447 mcg/dL (calc) — ABNORMAL HIGH (ref 250–425)

## 2017-07-24 NOTE — Addendum Note (Signed)
Addended by: Katina Dung on: 07/24/2017 10:47 AM   Modules accepted: Orders

## 2017-07-24 NOTE — Progress Notes (Signed)
Please call patient: I have reviewed his/her lab results. Results do not show any problems with his kidneys or urine. His chest xray is also normal. So, I am not certain why he is hurting on his side. However, his lab work now shows diabetes, high cholesterol, and anemia. All of these problems need to be addressed: I recommend another office visit so we can go over these results and discuss additional medications. Will need stool cards as well at that time.   Please add on tibc/iron studies: YB:[id number] anemia.

## 2017-07-27 NOTE — Progress Notes (Signed)
Please call patient: I have reviewed his/her lab results. Please call patient. See last message. Please schedule a f/u appt within the next 1-2 weeks. Needs to address iron deficiency anemia, diabetes etc ... See last note.

## 2017-08-06 ENCOUNTER — Ambulatory Visit: Payer: Medicare Other | Admitting: Family Medicine

## 2017-08-13 ENCOUNTER — Encounter: Payer: Self-pay | Admitting: Family Medicine

## 2017-08-13 ENCOUNTER — Other Ambulatory Visit: Payer: Self-pay

## 2017-08-13 ENCOUNTER — Ambulatory Visit (INDEPENDENT_AMBULATORY_CARE_PROVIDER_SITE_OTHER): Payer: Medicare Other | Admitting: Family Medicine

## 2017-08-13 VITALS — BP 142/90 | HR 55 | Temp 98.1°F | Ht 72.0 in | Wt 318.4 lb

## 2017-08-13 DIAGNOSIS — M546 Pain in thoracic spine: Secondary | ICD-10-CM | POA: Diagnosis not present

## 2017-08-13 DIAGNOSIS — G8929 Other chronic pain: Secondary | ICD-10-CM | POA: Diagnosis not present

## 2017-08-13 DIAGNOSIS — D509 Iron deficiency anemia, unspecified: Secondary | ICD-10-CM | POA: Diagnosis not present

## 2017-08-13 DIAGNOSIS — F4321 Adjustment disorder with depressed mood: Secondary | ICD-10-CM | POA: Diagnosis not present

## 2017-08-13 DIAGNOSIS — E1165 Type 2 diabetes mellitus with hyperglycemia: Secondary | ICD-10-CM

## 2017-08-13 DIAGNOSIS — Z9884 Bariatric surgery status: Secondary | ICD-10-CM

## 2017-08-13 DIAGNOSIS — Z8719 Personal history of other diseases of the digestive system: Secondary | ICD-10-CM

## 2017-08-13 DIAGNOSIS — E782 Mixed hyperlipidemia: Secondary | ICD-10-CM

## 2017-08-13 LAB — FECAL OCCULT BLOOD, GUAIAC

## 2017-08-13 MED ORDER — ATORVASTATIN CALCIUM 10 MG PO TABS: 10.0000 mg | ORAL_TABLET | Freq: Every day | ORAL | 3 refills | Status: AC

## 2017-08-13 NOTE — Patient Instructions (Addendum)
It was so good seeing you again! Thank you for  allowing me to continue caring for you. It means a lot to me.   Please schedule a follow up appointment with me in 8 weeks to follow-up on anemia/diabetes/back pain.   Orders have been placed for your back X-rays.  Please go to the [place] office for these.  Start an iron tablet 3 times daily.   Decrease ibuprofen use.   [facility]  - you can contact them at [phone]   We are here for you!   Please reach out to [place] if you need anything further!

## 2017-08-13 NOTE — Progress Notes (Signed)
Subjective  CC:  Chief Complaint  Patient presents with  . Diabetes    new onset- A1c 6.8  . Hyperlipidemia  . Anemia    HPI: Randy Grimes is a 67 y.o. male who presents to the office today for follow up of diabetes and problems listed above in the chief complaint.   His wife passed away unexpectedly last week: sepsis and cerebral bleed. He is grieving appropriately. Denies feeling like he is slipping back into depression. has a good family and friend support system right now. Thus the priority of his newly dxd dm is secondary now:   Diabetes follow up:dxd several weeks ago: discussed his diet a little. Nl renal fxn.   Lipids are up; will take a statin.   Low back and right flank pain persists; still using nsaids.   Iron deficiency anemia with hgb 10.9; denies melena, upper GI sxs or pain. Last colonoscopy was normal reportedly 2015.   Immunization History  Administered Date(s) Administered  . Meningococcal Polysaccharide 03/27/2012  . Pneumococcal Conjugate-13 07/22/2017  . Pneumococcal Polysaccharide-23 03/27/2012    Diabetes Related Lab Review: Lab Results  Component Value Date   HGBA1C 6.8 (H) 07/22/2017   HGBA1C 6.2 (H) 11/26/2012   HGBA1C 6.1 (H) 08/11/2012    No results found for: Derl [place] Lab Results  Component Value Date   CREATININE 0.76 07/22/2017   BUN 11 07/22/2017   NA 137 07/22/2017   K 4.1 07/22/2017   CL 103 07/22/2017   CO2 29 07/22/2017   Lab Results  Component Value Date   CHOL 217 (H) 07/22/2017   CHOL 259 (H) 03/01/2012   Lab Results  Component Value Date   HDL 44.40 07/22/2017   HDL 42 03/01/2012   Lab Results  Component Value Date   LDLCALC 152 (H) 07/22/2017   LDLCALC 174 (H) 03/01/2012   Lab Results  Component Value Date   TRIG 103.0 07/22/2017   TRIG 213 (H) 03/01/2012   Lab Results  Component Value Date   CHOLHDL 5 07/22/2017   CHOLHDL 6.2 03/01/2012   No results found for: LDLDIRECT The 10-year  ASCVD risk score Mikey Bussing DC Jr., et al., 2013) is: 34.6%   Values used to calculate the score:     Age: 67 years     Sex: Male     Is Non-Hispanic African American: No     Diabetic: Yes     Tobacco smoker: No     Systolic Blood Pressure: 096 mmHg     Is BP treated: Yes     HDL Cholesterol: 44.4 mg/dL     Total Cholesterol: 217 mg/dL I have reviewed the [place] Soc history. Patient Active Problem List   Diagnosis Date Noted  . Major depression, recurrent ([place]) 04/04/2013    Priority: High  . Chronic pain 11/26/2012    Priority: High    Secondary to fall from 30' in 80s. Has been treated by pain clinic in past. Now manages with CBD oil and high dose ibuprofen.   . Morbid obesity ([place]) 11/26/2012    Priority: High  . History of GI bleed - PUD 1989 09/04/2012    Priority: High  . Essential hypertension 08/11/2012    Priority: High  . Lumbar post-laminectomy syndrome 05/26/2012    Priority: High  . Chronic headache 07/22/2017    Priority: Medium    "sinus" s/p sinus surgery with minimal relief.   . History of acute respiratory failure 07/22/2017  Priority: Medium    Due to unintentional OD (chronic opioids) and pna: 2011 ARF due to rhabdo   . Memory disorder 04/30/2015    Priority: Medium  . 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 Lilyan Punt 08/11/2012    Priority: Medium  . Uncontrolled type 2 diabetes mellitus with hyperglycemia ([place]) 08/13/2017  . Posttraumatic stress disorder 09/04/2012  . Pain syndrome, chronic 04/22/2012    Social History: Patient  reports that he has never smoked. He has never used smokeless tobacco. He reports that he has current or past drug history. Drug: Marijuana. He reports that he does not drink alcohol.  Review of Systems: Ophthalmic: negative for eye pain, loss of vision or double vision Cardiovascular: negative for chest pain Respiratory: negative for SOB or persistent cough Gastrointestinal: negative for abdominal  pain Genitourinary: negative for dysuria or gross hematuria MSK: negative for foot lesions Neurologic: negative for weakness or gait disturbance  Objective  Vitals: BP (!) 142/90   Pulse (!) 55   Temp 98.1 F (36.7 C)   Ht 6' (1.829 m)   Wt (!) 318 lb 6.4 oz (144.4 kg)   BMI 43.18 kg/m  General: well appearing, no acute distress  Psych:  Alert and oriented, flat mood and affect HEENT:  Normocephalic, atraumatic, moist mucous membranes, supple neck  Cardiovascular:  Nl S1 and S2, RRR without murmur, gallop or rub. no edema Respiratory:  Good breath sounds bilaterally, CTAB with normal effort, no rales   Assessment  1. Uncontrolled type 2 diabetes mellitus with hyperglycemia ([place])   2. Mixed hyperlipidemia   3. Iron deficiency anemia, unspecified iron deficiency anemia type   4. 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 Lilyan Punt   5. History of GI bleed - PUD 1989      Plan   Diabetes is currently adequately controlled. No meds for now. Watch diet. Recheck 8 weeks. Will need urine testing and foot exam at that time. Will need diabetic eye exam.   Lipids -start statin.   Discussed anemia -? Malabsorption due to h/o gastric bypass but high risk for UGI bleed given nsaid use and h/o same; pt reluctant to stop nsaids. Start iron tx and recheck in 8 weeks. guaiac negative stool at this time. Defers gi eval. On PPI.  High risk for recurrent depression. rec grief counseling.   Back pain: check xrays; offered ortho or pain mgt. Pt declines at this time.  Diabetic education: ongoing education regarding chronic disease management for diabetes was given today. We continue to reinforce the ABC's of diabetic management: A1c (<7 or 8 dependent upon patient), tight blood pressure control, and cholesterol management with goal LDL < 100 minimally. We discuss diet strategies, exercise recommendations, medication options and possible side effects. At each visit, we review recommended immunizations  and preventive care recommendations for diabetics and stress that good diabetic control can prevent other problems. See below for this patient's data.  Follow up: 8 weeks to recheck above. .   Commons side effects, risks, benefits, and alternatives for medications and treatment plan prescribed today were discussed, and the patient expressed understanding of the given instructions. Patient is instructed to call or message via MyChart if he/she has any questions or concerns regarding our treatment plan. No barriers to understanding were identified. We discussed Red Flag symptoms and signs in detail. Patient expressed understanding regarding what to do in case of urgent or emergency type symptoms.   Medication list was reconciled, printed and provided to the patient in AVS. Patient  instructions and summary information was reviewed with the patient as documented in the AVS. This note was prepared with assistance of Dragon voice recognition software. Occasional wrong-word or sound-a-like substitutions may have occurred due to the inherent limitations of voice recognition software  No orders of the defined types were placed in this encounter.  No orders of the defined types were placed in this encounter.

## 2017-08-14 DIAGNOSIS — R079 Chest pain, unspecified: Secondary | ICD-10-CM | POA: Diagnosis not present

## 2017-08-15 ENCOUNTER — Observation Stay (HOSPITAL_BASED_OUTPATIENT_CLINIC_OR_DEPARTMENT_OTHER): Payer: Medicare Other

## 2017-08-15 ENCOUNTER — Other Ambulatory Visit: Payer: Self-pay

## 2017-08-15 ENCOUNTER — Observation Stay (HOSPITAL_COMMUNITY): Payer: Medicare Other

## 2017-08-15 ENCOUNTER — Encounter (HOSPITAL_COMMUNITY): Payer: Self-pay | Admitting: Oncology

## 2017-08-15 ENCOUNTER — Emergency Department (HOSPITAL_COMMUNITY): Payer: Medicare Other

## 2017-08-15 ENCOUNTER — Observation Stay (HOSPITAL_COMMUNITY)
Admission: EM | Admit: 2017-08-15 | Discharge: 2017-08-16 | Disposition: A | Discharge status: Home / Self Care | Payer: Medicare Other | Attending: Internal Medicine | Admitting: Internal Medicine

## 2017-08-15 DIAGNOSIS — Z7982 Long term (current) use of aspirin: Secondary | ICD-10-CM | POA: Insufficient documentation

## 2017-08-15 DIAGNOSIS — Z8719 Personal history of other diseases of the digestive system: Secondary | ICD-10-CM | POA: Diagnosis not present

## 2017-08-15 DIAGNOSIS — E785 Hyperlipidemia, unspecified: Secondary | ICD-10-CM | POA: Diagnosis present

## 2017-08-15 DIAGNOSIS — R102 Pelvic and perineal pain: Secondary | ICD-10-CM | POA: Diagnosis not present

## 2017-08-15 DIAGNOSIS — G8929 Other chronic pain: Secondary | ICD-10-CM | POA: Insufficient documentation

## 2017-08-15 DIAGNOSIS — Z9884 Bariatric surgery status: Secondary | ICD-10-CM | POA: Insufficient documentation

## 2017-08-15 DIAGNOSIS — I712 Thoracic aortic aneurysm, without rupture: Secondary | ICD-10-CM | POA: Diagnosis not present

## 2017-08-15 DIAGNOSIS — R609 Edema, unspecified: Secondary | ICD-10-CM | POA: Diagnosis not present

## 2017-08-15 DIAGNOSIS — R0789 Other chest pain: Principal | ICD-10-CM | POA: Insufficient documentation

## 2017-08-15 DIAGNOSIS — R079 Chest pain, unspecified: Secondary | ICD-10-CM | POA: Diagnosis not present

## 2017-08-15 DIAGNOSIS — I4581 Long QT syndrome: Secondary | ICD-10-CM | POA: Diagnosis not present

## 2017-08-15 DIAGNOSIS — Z79899 Other long term (current) drug therapy: Secondary | ICD-10-CM | POA: Insufficient documentation

## 2017-08-15 DIAGNOSIS — K219 Gastro-esophageal reflux disease without esophagitis: Secondary | ICD-10-CM | POA: Insufficient documentation

## 2017-08-15 DIAGNOSIS — R0602 Shortness of breath: Secondary | ICD-10-CM | POA: Diagnosis not present

## 2017-08-15 DIAGNOSIS — Z6841 Body Mass Index (BMI) 40.0 and over, adult: Secondary | ICD-10-CM | POA: Diagnosis not present

## 2017-08-15 DIAGNOSIS — I7121 Aneurysm of the ascending aorta, without rupture: Secondary | ICD-10-CM | POA: Diagnosis present

## 2017-08-15 DIAGNOSIS — F329 Major depressive disorder, single episode, unspecified: Secondary | ICD-10-CM | POA: Diagnosis not present

## 2017-08-15 DIAGNOSIS — Z8711 Personal history of peptic ulcer disease: Secondary | ICD-10-CM | POA: Diagnosis not present

## 2017-08-15 DIAGNOSIS — M549 Dorsalgia, unspecified: Secondary | ICD-10-CM | POA: Insufficient documentation

## 2017-08-15 DIAGNOSIS — R7989 Other specified abnormal findings of blood chemistry: Secondary | ICD-10-CM | POA: Diagnosis not present

## 2017-08-15 DIAGNOSIS — E119 Type 2 diabetes mellitus without complications: Secondary | ICD-10-CM | POA: Insufficient documentation

## 2017-08-15 DIAGNOSIS — I10 Essential (primary) hypertension: Secondary | ICD-10-CM | POA: Diagnosis not present

## 2017-08-15 DIAGNOSIS — R072 Precordial pain: Secondary | ICD-10-CM | POA: Diagnosis not present

## 2017-08-15 DIAGNOSIS — K439 Ventral hernia without obstruction or gangrene: Secondary | ICD-10-CM | POA: Insufficient documentation

## 2017-08-15 LAB — CBC
HCT: 37.3 % — ABNORMAL LOW (ref 39.0–52.0)
Hemoglobin: 11.2 g/dL — ABNORMAL LOW (ref 13.0–17.0)
MCH: 24 pg — ABNORMAL LOW (ref 26.0–34.0)
MCHC: 30 g/dL (ref 30.0–36.0)
MCV: 79.9 fL (ref 78.0–100.0)
PLATELETS: 222 10*3/uL (ref 150–400)
RBC: 4.67 MIL/uL (ref 4.22–5.81)
RDW: 15.6 % — AB (ref 11.5–15.5)
WBC: 4.7 10*3/uL (ref 4.0–10.5)

## 2017-08-15 LAB — BASIC METABOLIC PANEL
Anion gap: 7 (ref 5–15)
BUN: 12 mg/dL (ref 6–20)
CALCIUM: 8.8 mg/dL — AB (ref 8.9–10.3)
CO2: 25 mmol/L (ref 22–32)
CREATININE: 0.87 mg/dL (ref 0.61–1.24)
Chloride: 107 mmol/L (ref 101–111)
GFR calc Af Amer: >60 mL/min (ref >60)
GFR calc non Af Amer: >60 mL/min (ref >60)
GLUCOSE: 141 mg/dL — AB (ref 65–99)
Potassium: 3.9 mmol/L (ref 3.5–5.1)
Sodium: 139 mmol/L (ref 135–145)

## 2017-08-15 LAB — URINALYSIS, ROUTINE W REFLEX MICROSCOPIC
BACTERIA UA: NONE SEEN
Bilirubin Urine: NEGATIVE
GLUCOSE, UA: NEGATIVE mg/dL
KETONES UR: NEGATIVE mg/dL
LEUKOCYTES UA: NEGATIVE
NITRITE: NEGATIVE
PH: 6 (ref 5.0–8.0)
PROTEIN: NEGATIVE mg/dL
Specific Gravity, Urine: 1.013 (ref 1.005–1.030)

## 2017-08-15 LAB — TROPONIN I
Troponin I: <0.03 ng/mL (ref <0.03)
Troponin I: <0.03 ng/mL (ref <0.03)

## 2017-08-15 LAB — RAPID URINE DRUG SCREEN, HOSP PERFORMED
AMPHETAMINES: NOT DETECTED
BENZODIAZEPINES: NOT DETECTED
Barbiturates: NOT DETECTED
COCAINE: NOT DETECTED
Opiates: NOT DETECTED
Tetrahydrocannabinol: NOT DETECTED

## 2017-08-15 LAB — D-DIMER, QUANTITATIVE (NOT AT ARMC): D DIMER QUANT: 0.59 ug{FEU}/mL — AB (ref 0.00–0.50)

## 2017-08-15 LAB — LIPID PANEL
Cholesterol: 194 mg/dL (ref 0–200)
HDL: 48 mg/dL (ref >40)
LDL CALC: 134 mg/dL — AB (ref 0–99)
TRIGLYCERIDES: 58 mg/dL (ref <150)
Total CHOL/HDL Ratio: 4 RATIO
VLDL: 12 mg/dL (ref 0–40)

## 2017-08-15 LAB — I-STAT TROPONIN, ED: Troponin i, poc: 0.01 ng/mL (ref 0.00–0.08)

## 2017-08-15 LAB — HEMOGLOBIN A1C
HEMOGLOBIN A1C: 6.5 % — AB (ref 4.8–5.6)
Mean Plasma Glucose: 139.85 mg/dL

## 2017-08-15 LAB — MRSA PCR SCREENING: MRSA by PCR: POSITIVE — AB

## 2017-08-15 LAB — BRAIN NATRIURETIC PEPTIDE: B Natriuretic Peptide: 39.1 pg/mL (ref 0.0–100.0)

## 2017-08-15 MED ORDER — MUPIROCIN 2 % EX OINT
1.0000 "application " | TOPICAL_OINTMENT | Freq: Two times a day (BID) | CUTANEOUS | Status: DC
  Administered 2017-08-15 – 2017-08-16 (×2): 1 via NASAL
  Filled 2017-08-15: qty 22

## 2017-08-15 MED ORDER — REGADENOSON 0.4 MG/5ML IV SOLN
0.4000 mg | Freq: Once | INTRAVENOUS | Status: AC
  Administered 2017-08-15: 0.4 mg via INTRAVENOUS
  Filled 2017-08-15: qty 5

## 2017-08-15 MED ORDER — LORAZEPAM 2 MG/ML IJ SOLN
1.0000 mg | Freq: Once | INTRAMUSCULAR | Status: AC
  Administered 2017-08-15: 1 mg via INTRAVENOUS
  Filled 2017-08-15: qty 1

## 2017-08-15 MED ORDER — ACETAMINOPHEN 325 MG PO TABS
650.0000 mg | ORAL_TABLET | ORAL | Status: DC | PRN
  Administered 2017-08-16: 650 mg via ORAL
  Filled 2017-08-15: qty 2

## 2017-08-15 MED ORDER — HYDROCODONE-ACETAMINOPHEN 5-325 MG PO TABS
1.0000 | ORAL_TABLET | Freq: Four times a day (QID) | ORAL | Status: AC | PRN
  Administered 2017-08-15 – 2017-08-16 (×2): 1 via ORAL
  Filled 2017-08-15 (×2): qty 1

## 2017-08-15 MED ORDER — IOPAMIDOL (ISOVUE-370) INJECTION 76%
100.0000 mL | Freq: Once | INTRAVENOUS | Status: AC | PRN
  Administered 2017-08-15: 100 mL via INTRAVENOUS

## 2017-08-15 MED ORDER — METOPROLOL TARTRATE 25 MG PO TABS
25.0000 mg | ORAL_TABLET | Freq: Two times a day (BID) | ORAL | Status: DC
  Administered 2017-08-15 – 2017-08-16 (×3): 25 mg via ORAL
  Filled 2017-08-15 (×3): qty 1

## 2017-08-15 MED ORDER — HYDROMORPHONE HCL 1 MG/ML IJ SOLN
0.5000 mg | Freq: Once | INTRAMUSCULAR | Status: AC
  Administered 2017-08-15: 0.5 mg via INTRAVENOUS
  Filled 2017-08-15: qty 0.5

## 2017-08-15 MED ORDER — HYDRALAZINE HCL 20 MG/ML IJ SOLN: 5.0000 mg | INTRAMUSCULAR | Status: DC | PRN

## 2017-08-15 MED ORDER — ZOLPIDEM TARTRATE 5 MG PO TABS: 5.0000 mg | ORAL_TABLET | Freq: Every evening | ORAL | Status: DC | PRN

## 2017-08-15 MED ORDER — TECHNETIUM TC 99M TETROFOSMIN IV KIT
30.0000 | PACK | Freq: Once | INTRAVENOUS | Status: AC | PRN
  Administered 2017-08-15: 30 via INTRAVENOUS

## 2017-08-15 MED ORDER — LOSARTAN POTASSIUM 25 MG PO TABS
25.0000 mg | ORAL_TABLET | Freq: Every day | ORAL | Status: DC
  Administered 2017-08-15 – 2017-08-16 (×2): 25 mg via ORAL
  Filled 2017-08-15 (×2): qty 1

## 2017-08-15 MED ORDER — ENOXAPARIN SODIUM 40 MG/0.4ML ~~LOC~~ SOLN
40.0000 mg | SUBCUTANEOUS | Status: DC
  Administered 2017-08-15 – 2017-08-16 (×2): 40 mg via SUBCUTANEOUS
  Filled 2017-08-15 (×3): qty 0.4

## 2017-08-15 MED ORDER — MORPHINE SULFATE (PF) 4 MG/ML IV SOLN
2.0000 mg | INTRAVENOUS | Status: DC | PRN
  Administered 2017-08-15 – 2017-08-16 (×5): 2 mg via INTRAVENOUS
  Filled 2017-08-15 (×5): qty 1

## 2017-08-15 MED ORDER — ASPIRIN EC 81 MG PO TBEC
81.0000 mg | DELAYED_RELEASE_TABLET | Freq: Every day | ORAL | Status: DC
  Administered 2017-08-15 – 2017-08-16 (×2): 81 mg via ORAL
  Filled 2017-08-15 (×2): qty 1

## 2017-08-15 MED ORDER — ATORVASTATIN CALCIUM 10 MG PO TABS
10.0000 mg | ORAL_TABLET | Freq: Every day | ORAL | Status: DC
  Administered 2017-08-15 – 2017-08-16 (×2): 10 mg via ORAL
  Filled 2017-08-15 (×3): qty 1

## 2017-08-15 MED ORDER — PANTOPRAZOLE SODIUM 40 MG PO TBEC
40.0000 mg | DELAYED_RELEASE_TABLET | Freq: Every day | ORAL | Status: DC
  Administered 2017-08-15 – 2017-08-16 (×2): 40 mg via ORAL
  Filled 2017-08-15 (×2): qty 1

## 2017-08-15 MED ORDER — IOPAMIDOL (ISOVUE-370) INJECTION 76%
INTRAVENOUS | Status: AC
  Filled 2017-08-15: qty 100

## 2017-08-15 MED ORDER — REGADENOSON 0.4 MG/5ML IV SOLN
INTRAVENOUS | Status: AC
  Administered 2017-08-15: 11:00:00
  Filled 2017-08-15: qty 5

## 2017-08-15 MED ORDER — ALPRAZOLAM 0.5 MG PO TABS
0.5000 mg | ORAL_TABLET | Freq: Three times a day (TID) | ORAL | Status: DC | PRN
  Administered 2017-08-15: 0.5 mg via ORAL
  Filled 2017-08-15: qty 1

## 2017-08-15 MED ORDER — PNEUMOCOCCAL VAC POLYVALENT 25 MCG/0.5ML IJ INJ
0.5000 mL | INJECTION | INTRAMUSCULAR | Status: DC
Start: 2017-08-16 — End: 2017-08-16

## 2017-08-15 MED ORDER — FENTANYL CITRATE (PF) 100 MCG/2ML IJ SOLN
50.0000 ug | Freq: Once | INTRAMUSCULAR | Status: AC
  Administered 2017-08-15: 50 ug via INTRAVENOUS
  Filled 2017-08-15: qty 2

## 2017-08-15 MED ORDER — NITROGLYCERIN 0.4 MG SL SUBL
0.4000 mg | SUBLINGUAL_TABLET | SUBLINGUAL | Status: DC | PRN
  Administered 2017-08-15: 0.4 mg via SUBLINGUAL
  Filled 2017-08-15: qty 1

## 2017-08-15 MED ORDER — ONDANSETRON HCL 4 MG/2ML IJ SOLN
4.0000 mg | Freq: Four times a day (QID) | INTRAMUSCULAR | Status: DC | PRN
  Administered 2017-08-15: 4 mg via INTRAVENOUS
  Filled 2017-08-15: qty 2

## 2017-08-15 MED ORDER — CHLORHEXIDINE GLUCONATE CLOTH 2 % EX PADS
6.0000 | MEDICATED_PAD | Freq: Every day | CUTANEOUS | Status: DC
  Administered 2017-08-16: 6 via TOPICAL

## 2017-08-15 NOTE — Consult Note (Signed)
Cardiology Consultation:   Patient ID: KEKOA FYOCK; [id number]; [birth year]   Admit date: 08/15/2017 Date of Consult: 08/15/2017  Primary Care Provider: Leamon Arnt, MD Primary Cardiologist: New; Dr Stanford Breed   Patient Profile:   Randy Grimes is a 67 y.o. male with a hx of hypertension, prior gastric bypass surgery, chronic back pain, gastroesophageal reflux disease who is being seen today for the evaluation of chest pain at the request of Ivor Costa MD.  History of Present Illness:   Patient states he developed pain in the epigastric/left breast area at 9 PM last evening.  It is described as something sitting on his chest.  No radiation.  Not pleuritic, positional or exertional.  No associated nausea or diaphoresis but there is dyspnea.  The pain has been continuous since 9:00 last evening.  Patient has mild chronic bilateral lower extremity edema.  Also of note his wife died 1 week ago.  Cardiology now asked to evaluate.  Past Medical History:  Diagnosis Date  . Allergy   . Arthritis   . Chronic back pain   . Chronic headache 07/22/2017   "sinus" s/p sinus surgery with minimal relief.  . Depression   . GERD (gastroesophageal reflux disease)    takes Protonix daily  . GI bleeding 1989  . History of acute respiratory failure 07/22/2017   Due to unintentional OD (chronic opioids) and pna: 2011 ARF due to rhabdo  . History of blood transfusion    no abnormal reaction noted  . History of gastric ulcer    x2  . Hypertension   . Memory disorder 04/30/2015  . Obesity, Class III, BMI 40-49.9 (morbid obesity) ([place])     Past Surgical History:  Procedure Laterality Date  . ABDOMINAL FAT GRAPH  03/26/2012   Procedure: ABDOMINAL FAT GRAPH;  Surgeon: Ruby Cola, MD;  Location: [facility];  Service: ENT;  Laterality: N/A;  . ANKLE SURGERY Left   . BACK SURGERY    . ESOPHAGOGASTRODUODENOSCOPY N/A 05/27/2012   Procedure: ESOPHAGOGASTRODUODENOSCOPY (EGD);  Surgeon: Shann Medal, MD;   Location: [facility];  Service: General;  Laterality: N/A;  . ESOPHAGOGASTRODUODENOSCOPY N/A 08/10/2012   Procedure: ESOPHAGOGASTRODUODENOSCOPY (EGD);  Surgeon: Madilyn Hook, DO;  Location: [facility];  Service: General;  Laterality: N/A;  . FRACTURE SURGERY     left leg as child  . HAND SURGERY Left   . LAPAROSCOPIC GASTRIC SLEEVE RESECTION N/A 08/10/2012   Procedure: LAPAROSCOPIC GASTRIC SLEEVE RESECTION;  Surgeon: Madilyn Hook, DO;  Location: [facility];  Service: General;  Laterality: N/A;  . left testcle removed    . LEG SURGERY Bilateral   . NASAL SINUS SURGERY  03/26/2012   with fusion, x3  . SINUS ENDO W/FUSION  03/26/2012   Procedure: ENDOSCOPIC SINUS SURGERY WITH FUSION NAVIGATION;  Surgeon: Ruby Cola, MD;  Location[facility];  Service: ENT;  Laterality: N/A;  LEFT ETHMOIDECTOMY; BILATERAL SPHENOIDECTOMY; LEFT MAXILLARY ANTROSTOMY WITH IMAGE GUIDANCE; REPAIR LEFT SPHENOID CSF LEAK; NASAL SEPTAL FLAP  . spinal cord stimulator placed    . spinal cord stimulator removed    . TIBIA FRACTURE SURGERY     multiple  . TONSILLECTOMY     as a child  . undescended testicle     as a child  . VASECTOMY         Inpatient Medications: Scheduled Meds: . aspirin EC  81 mg Oral Daily  . atorvastatin  10 mg Oral Daily  . enoxaparin (LOVENOX) injection  40 mg Subcutaneous  Q24H  . iopamidol      . metoprolol tartrate  25 mg Oral BID  . pantoprazole  40 mg Oral Daily   Continuous Infusions:  PRN Meds: acetaminophen, ALPRAZolam, hydrALAZINE, morphine injection, nitroGLYCERIN, ondansetron (ZOFRAN) IV, zolpidem  Allergies:   No Known Allergies  Social History:   Social History   Socioeconomic History  . Marital status: Married    Spouse name: Cecille Rubin  . Number of children: 5  . Years of education: 35  . Highest education level: Not on file  Occupational History  . Occupation: disabled  Social Needs  . Financial resource strain: Somewhat hard  . Food insecurity:    Worry: Not on file      Inability: Not on file  . Transportation needs:    Medical: Not on file    Non-medical: Not on file  Tobacco Use  . Smoking status: Never Smoker  . Smokeless tobacco: Never Used  Substance and Sexual Activity  . Alcohol use: No    Comment: rare  . Drug use: Yes    Types: Marijuana    Comment: months  ago   . Sexual activity: Not Currently  Lifestyle  . Physical activity:    Days per week: Not on file    Minutes per session: Not on file  . Stress: Not on file  Relationships  . Social connections:    Talks on phone: Not on file    Gets together: Not on file    Attends religious service: Not on file    Active member of club or organization: Not on file    Attends meetings of clubs or organizations: Not on file    Relationship status: Not on file  . Intimate partner violence:    Fear of current or ex partner: Not on file    Emotionally abused: Not on file    Physically abused: Not on file    Forced sexual activity: Not on file  Other Topics Concern  . Not on file  Social History Narrative   Has been on disability since 2006   Never smoker or drinker   Curtrently lives in [place] with wife   Patient drinks caffeine occasionally.   Patient is right handed.     Family History:    Family History  Problem Relation Age of Onset  . Stroke Father   . Asthma Other   . Depression Other   . Diabetes Other   . Heart disease Other   . Hypertension Other   . Stroke Other   . Drug abuse Daughter   . Dementia Neg Hx      ROS:  Please see the history of present illness.  Patient denies fevers, chills, productive cough, hemoptysis, dysphagia, melena or hematochezia. All other ROS reviewed and negative.     Physical Exam/Data:   Vitals:   08/15/17 0530 08/15/17 0545 08/15/17 0633 08/15/17 0755  BP: (!) 153/84 (!) 150/73 (!) 141/80 128/66  Pulse: 85 82 76 71  Resp: (!) 23 (!) 24 17 (!) 22  Temp:   98.6 F (37 C) 99 F (37.2 C)  TempSrc:   Oral Oral  SpO2: 97%  94% 99% 99%  Weight:   (!) 309 lb 1.4 oz (140.2 kg)   Height:   6' (1.829 m)     Intake/Output Summary (Last 24 hours) at 08/15/2017 1006 Last data filed at 08/15/2017 0325 Gross per 24 hour  Intake -  Output 400 ml  Net -400 ml  Filed Weights   08/15/17 0633  Weight: (!) 309 lb 1.4 oz (140.2 kg)   Body mass index is 41.92 kg/m.  General:  Well nourished, obese, in no acute distress HEENT: normal Lymph: no adenopathy Neck: no JVD Endocrine:  No thryomegaly Vascular: No carotid bruits; FA pulses 2+ bilaterally without bruits  Cardiac:  normal S1, S2; RRR; 2/6 systolic murmur apex Lungs:  clear to auscultation bilaterally, no wheezing, rhonchi or rales  Abd: soft, nontender, no hepatomegaly, umbilical hernia Ext: trace edema Musculoskeletal:  No deformities, BUE and BLE strength normal and equal Skin: warm and dry  Neuro:  CNs 2-12 intact, no focal abnormalities noted Psych:  Normal affect   EKG:  The EKG was personally reviewed and demonstrates: Normal sinus rhythm with PVC and nonspecific ST changes. Telemetry:  Telemetry was personally reviewed and demonstrates:  Sinus rhythm.  Laboratory Data:  Chemistry Recent Labs  Lab 08/15/17 0048  NA 139  K 3.9  CL 107  CO2 25  GLUCOSE 141*  BUN 12  CREATININE 0.87  CALCIUM 8.8*  GFRNONAA >60  GFRAA >60  ANIONGAP 7    Hematology Recent Labs  Lab 08/15/17 0048  WBC 4.7  RBC 4.67  HGB 11.2*  HCT 37.3*  MCV 79.9  MCH 24.0*  MCHC 30.0  RDW 15.6*  PLT 222   Cardiac Enzymes Recent Labs  Lab 08/15/17 0349  TROPONINI <0.03    Recent Labs  Lab 08/15/17 0055  TROPIPOC 0.01    BNP Recent Labs  Lab 08/15/17 0048  BNP 39.1    DDimer  Recent Labs  Lab 08/15/17 0048  DDIMER 0.59*    Radiology/Studies:  Dg Chest 2 View  Result Date: 08/15/2017 CLINICAL DATA:  Substernal chest pain starting 2 hours ago with nausea and dyspnea. EXAM: CHEST - 2 VIEW COMPARISON:  07/22/2017 FINDINGS: AP view of the  chest. Mild cardiac enlargement. Slightly uncoiled thoracic aorta without aneurysm. Mild central vascular congestion without pulmonary consolidation, effusion or pneumothorax. No acute osseous abnormality. Degenerative changes are present along the dorsal spine. IMPRESSION: Cardiomegaly with mild vascular congestion. Electronically Signed   By: Ashley Royalty M.D.   On: 08/15/2017 02:08   Ct Angio Chest Pe W Or Wo Contrast  Result Date: 08/15/2017 CLINICAL DATA:  Elevated D-dimer. Assess for pulmonary embolus. EXAM: CT ANGIOGRAPHY CHEST WITH CONTRAST TECHNIQUE: Multidetector CT imaging of the chest was performed using the standard protocol during bolus administration of intravenous contrast. Multiplanar CT image reconstructions and MIPs were obtained to evaluate the vascular anatomy. CONTRAST:  176mL ISOVUE-370 IOPAMIDOL (ISOVUE-370) INJECTION 76% COMPARISON:  Chest radiograph performed earlier today at 1:28 a.m., and CT of the chest performed 12/09/2009 FINDINGS: Cardiovascular:  There is no evidence of pulmonary embolus. There is aneurysmal dilatation of the ascending thoracic aorta to 4.9 cm in AP dimension. The aneurysmal dilatation resolves at the aortic arch. The great vessels are grossly unremarkable. Mediastinum/Nodes: The mediastinum is unremarkable in appearance. No mediastinal lymphadenopathy is seen. The visualized portions of the thyroid gland are unremarkable. No axillary lymphadenopathy is seen. Lungs/Pleura: Mild bilateral dependent subsegmental atelectasis is noted. No pleural effusion or pneumothorax is seen. No masses are identified. Upper Abdomen: The visualized portions of the liver and spleen are unremarkable. The patient is status post sleeve gastrectomy. Musculoskeletal: No acute osseous abnormalities are identified. The visualized musculature is unremarkable in appearance. Review of the MIP images confirms the above findings. IMPRESSION: 1. No evidence of pulmonary embolus. 2. Aneurysmal  dilatation of the ascending  thoracic aorta to 4.9 cm in AP dimension. Ascending thoracic aortic aneurysm. Recommend semi-annual imaging followup by CTA or MRA and referral to cardiothoracic surgery if not already obtained. This recommendation follows 2010 ACCF/AHA/AATS/ACR/ASA/SCA/SCAI/SIR/STS/SVM Guidelines for the Diagnosis and Management of Patients With Thoracic Aortic Disease. Circulation. 2010; 121: M211-Z735 3. Mild bilateral dependent subsegmental atelectasis noted; lungs otherwise clear. Electronically Signed   By: Garald Balding M.D.   On: 08/15/2017 04:53    Assessment and Plan:   1. Chest pain-symptoms are extremely atypical.  They have been present for 13 hours continuously without complete resolution.  There is some increase with palpation.  Possibly musculoskeletal.  Enzymes are negative.  We can pursue functional study tomorrow for risk stratification. 2. Thoracic aortic aneurysm-CTA was performed because of elevated d-dimer which showed no pulmonary embolus.  There was note of a 4.9 cm thoracic aortic aneurysm.  No dissection.  We will arrange echocardiogram to rule out bicuspid aortic valve.  Patient will need follow-up CTA or MRA in 6 months.  Will add ARB for blood pressure. 3. Hypertension-blood pressure mildly elevated.  Given thoracic aortic aneurysm we will add ARB.  Begin Cozaar 25 mg daily and advance as needed. 4. Hyperlipidemia-continue statin. 5. Elevated hemoglobin A1c-likely has diabetes.  Further evaluation and management per primary care.   For questions or updates, please contact [place] Please consult [email or web address] for contact info under Cardiology/STEMI.   Signed, Kirk Ruths, MD  08/15/2017 10:06 AM

## 2017-08-15 NOTE — ED Notes (Signed)
Lab contacted to add on BNP.  Per lab they would add test on.

## 2017-08-15 NOTE — Plan of Care (Signed)
Continue with current care plan

## 2017-08-15 NOTE — H&P (Signed)
History and Physical    Randy Grimes VOH:[id number] DOB: 09-Feb-1951 DOA: 08/15/2017  Referring MD/NP/PA:   PCP: Leamon Arnt, MD   Patient coming from:  The patient is coming from home.  At baseline, pt is independent for most of ADL.  Chief Complaint: Chest pain  HPI: Randy Grimes is a 67 y.o. male with medical history significant of hypertension, hyperlipidemia, diet-controlled diabetes, GERD, depression, gastric ulcer, GI bleeding, gastric bypass surgery, morbid obesity, who presents with chest pain.  Patient states that his chest pain started last night it is located in the substernal.  And the left side of chest, 8 out of 10 severity, pressure-like, nonradiating.  It is not pleuritic.  It is associated with shortness of breath, nausea, diaphoresis.  Patient has a mild cough, but no fever or chills.  Patient does not have nausea, vomiting or diarrhea.  He reports suprapubic mild abdominal pain.  He has a small ventral hernia, which has not changed in size.  No tenderness over the ventral hernia area.  Patient denies symptoms of UTI or unilateral weakness.  No recent long distance traveling.  Patient has has mild chronic bilateral leg edema which has not changed recently. Of note, pt's wife passed away on 07-06-2022.   ED Course: pt was found to have negative troponin, BNP 39.1, positive d-dimer 0.59, electrolytes renal function okay, temperature normal, no tachycardia, has tachypnea, oxygen saturation 93% on room air, chest x-ray showed vascular congestion.  Patient is placed on telemetry bed for observation.  # CTA:  negative for PE, but showed aneurysmal dilatation of the ascending thoracic aorta to 4.9 cm in AP dimension.    Review of Systems:   General: no fevers, chills, no body weight gain, has poor appetite, has fatigue HEENT: no blurry vision, hearing changes or sore throat Respiratory: has dyspnea, coughing, no wheezing CV: has chest pain, no palpitations GI: no nausea,  vomiting, has suprapubic abdominal pain, no diarrhea, constipation GU: no dysuria, burning on urination, increased urinary frequency, hematuria  Ext: has leg edema Neuro: no unilateral weakness, numbness, or tingling, no vision change or hearing loss Skin: no rash, no skin tear. MSK: No muscle spasm, no deformity, no limitation of range of movement in spin Heme: No easy bruising.  Travel history: No recent long distant travel.  Allergy: No Known Allergies  Past Medical History:  Diagnosis Date  . Allergy   . Arthritis   . Chronic back pain   . Chronic headache 07/22/2017   "sinus" s/p sinus surgery with minimal relief.  . Depression   . GERD (gastroesophageal reflux disease)    takes Protonix daily  . GI bleeding 1989  . History of acute respiratory failure 07/22/2017   Due to unintentional OD (chronic opioids) and pna: 2011 ARF due to rhabdo  . History of blood transfusion    no abnormal reaction noted  . History of gastric ulcer    x2  . Hypertension   . Memory disorder 04/30/2015  . Obesity, Class III, BMI 40-49.9 (morbid obesity) ([place])     Past Surgical History:  Procedure Laterality Date  . ABDOMINAL FAT GRAPH  03/26/2012   Procedure: ABDOMINAL FAT GRAPH;  Surgeon: Ruby Cola, MD;  Location: [place];  Service: ENT;  Laterality: N/A;  . ANKLE SURGERY Left   . BACK SURGERY    . ESOPHAGOGASTRODUODENOSCOPY N/A 05/27/2012   Procedure: ESOPHAGOGASTRODUODENOSCOPY (EGD);  Surgeon: Shann Medal, MD;  Location: [facility];  Service: General;  Laterality:  N/A;  . ESOPHAGOGASTRODUODENOSCOPY N/A 08/10/2012   Procedure: ESOPHAGOGASTRODUODENOSCOPY (EGD);  Surgeon: Madilyn Hook, DO;  Location: [facility];  Service: General;  Laterality: N/A;  . FRACTURE SURGERY     left leg as child  . HAND SURGERY Left   . LAPAROSCOPIC GASTRIC SLEEVE RESECTION N/A 08/10/2012   Procedure: LAPAROSCOPIC GASTRIC SLEEVE RESECTION;  Surgeon: Madilyn Hook, DO;  Location: [facility];  Service: General;   Laterality: N/A;  . left testcle removed    . LEG SURGERY Bilateral   . NASAL SINUS SURGERY  03/26/2012   with fusion, x3  . SINUS ENDO W/FUSION  03/26/2012   Procedure: ENDOSCOPIC SINUS SURGERY WITH FUSION NAVIGATION;  Surgeon: Ruby Cola, MD;  Location: [facility];  Service: ENT;  Laterality: N/A;  LEFT ETHMOIDECTOMY; BILATERAL SPHENOIDECTOMY; LEFT MAXILLARY ANTROSTOMY WITH IMAGE GUIDANCE; REPAIR LEFT SPHENOID CSF LEAK; NASAL SEPTAL FLAP  . spinal cord stimulator placed    . spinal cord stimulator removed    . TIBIA FRACTURE SURGERY     multiple  . TONSILLECTOMY     as a child  . undescended testicle     as a child  . VASECTOMY      Social History:  reports that he has never smoked. He has never used smokeless tobacco. He reports that he has current or past drug history. Drug: Marijuana. He reports that he does not drink alcohol.  Family History:  Family History  Problem Relation Age of Onset  . Stroke Father   . Asthma Other   . Depression Other   . Diabetes Other   . Heart disease Other   . Hypertension Other   . Stroke Other   . Drug abuse Daughter   . Dementia Neg Hx      Prior to Admission medications   Medication Sig Start Date End Date Taking? Authorizing Provider  ibuprofen (ADVIL,MOTRIN) 200 MG tablet Take 800 mg by mouth every 6 (six) hours as needed for moderate pain.   Yes [provider]  atorvastatin (LIPITOR) 10 MG tablet Take 1 tablet (10 mg total) by mouth daily. Patient not taking: Reported on 08/15/2017 08/13/17   Leamon Arnt, MD  cetirizine (ZYRTEC) 10 MG tablet Take 1 tablet (10 mg total) by mouth daily. Patient not taking: Reported on 08/15/2017 07/22/17   Leamon Arnt, MD  fluticasone [facility]) 50 MCG/ACT nasal spray Place 2 sprays into both nostrils daily. Patient not taking: Reported on 08/15/2017 07/22/17   Leamon Arnt, MD  metoprolol tartrate (LOPRESSOR) 25 MG tablet Take 1 tablet (25 mg total) by mouth 2 (two) times daily. Patient  not taking: Reported on 08/15/2017 07/22/17   Leamon Arnt, MD  omeprazole (PRILOSEC) 20 MG capsule Take 1 capsule (20 mg total) by mouth daily. Patient not taking: Reported on 08/15/2017 07/22/17   Leamon Arnt, MD    Physical Exam: Vitals:   08/15/17 0300 08/15/17 0315 08/15/17 0330 08/15/17 0345  BP: (!) 144/88 (!) 144/77 136/80 137/79  Pulse: 81 88 93 89  Resp: (!) 31 (!) 31 (!) 25 (!) 26  Temp:      TempSrc:      SpO2: 94% 94% 90% 93%   General: Not in acute distress. Morbid obesity  HEENT:       Eyes: PERRL, EOMI, no scleral icterus.       ENT: No discharge from the ears and nose, no pharynx injection, no tonsillar enlargement.        Neck: No JVD,  no bruit, no mass felt. Heme: No neck lymph node enlargement. Cardiac: S1/S2, RRR, No murmurs, No gallops or rubs. Respiratory:  No rales, wheezing, rhonchi or rubs. GI: Soft, nondistended, nontender, no rebound pain, no organomegaly, BS present. GU: No hematuria Ext: 1+ pitting leg edema bilaterally. 2+DP/PT pulse bilaterally. Musculoskeletal: No joint deformities, No joint redness or warmth, no limitation of ROM in spin. Skin: No rashes.  Neuro: Alert, oriented X3, cranial nerves II-XII grossly intact, moves all extremities normally Psych: Patient is not psychotic, no suicidal or hemocidal ideation.  Labs on Admission: I have personally reviewed following labs and imaging studies  CBC: Recent Labs  Lab 08/15/17 0048  WBC 4.7  HGB 11.2*  HCT 37.3*  MCV 79.9  PLT 875   Basic Metabolic Panel: Recent Labs  Lab 08/15/17 0048  NA 139  K 3.9  CL 107  CO2 25  GLUCOSE 141*  BUN 12  CREATININE 0.87  CALCIUM 8.8*   GFR: Estimated Creatinine Clearance: 124.9 mL/min (by C-G formula based on SCr of 0.87 mg/dL). Liver Function Tests: No results for input(s): AST, ALT, ALKPHOS, BILITOT, PROT, ALBUMIN in the last 168 hours. No results for input(s): LIPASE, AMYLASE in the last 168 hours. No results for input(s): AMMONIA  in the last 168 hours. Coagulation Profile: No results for input(s): INR, PROTIME in the last 168 hours. Cardiac Enzymes: Recent Labs  Lab 08/15/17 0349  TROPONINI <0.03   BNP (last 3 results) No results for input(s): PROBNP in the last 8760 hours. HbA1C: Recent Labs    08/15/17 0350  HGBA1C 6.5*   CBG: No results for input(s): GLUCAP in the last 168 hours. Lipid Profile: Recent Labs    08/15/17 0352  CHOL 194  HDL 48  LDLCALC 134*  TRIG 58  CHOLHDL 4.0   Thyroid Function Tests: No results for input(s): TSH, T4TOTAL, FREET4, T3FREE, THYROIDAB in the last 72 hours. Anemia Panel: No results for input(s): VITAMINB12, FOLATE, FERRITIN, TIBC, IRON, RETICCTPCT in the last 72 hours. Urine analysis:    Component Value Date/Time   COLORURINE STRAW (A) 08/15/2017 0341   APPEARANCEUR CLEAR 08/15/2017 0341   LABSPEC 1.013 08/15/2017 0341   PHURINE 6.0 08/15/2017 0341   GLUCOSEU NEGATIVE 08/15/2017 0341   HGBUR SMALL (A) 08/15/2017 0341   BILIRUBINUR NEGATIVE 08/15/2017 0341   BILIRUBINUR 2+ 07/22/2017 1058   KETONESUR NEGATIVE 08/15/2017 0341   PROTEINUR NEGATIVE 08/15/2017 0341   UROBILINOGEN 0.2 07/22/2017 1058   UROBILINOGEN 0.2 02/07/2014 0900   NITRITE NEGATIVE 08/15/2017 0341   LEUKOCYTESUR NEGATIVE 08/15/2017 0341   Sepsis Labs: @LABRCNTIP (procalcitonin:4,lacticidven:4) )No results found for this or any previous visit (from the past 240 hour(s)).   Radiological Exams on Admission: Dg Chest 2 View  Result Date: 08/15/2017 CLINICAL DATA:  Substernal chest pain starting 2 hours ago with nausea and dyspnea. EXAM: CHEST - 2 VIEW COMPARISON:  07/22/2017 FINDINGS: AP view of the chest. Mild cardiac enlargement. Slightly uncoiled thoracic aorta without aneurysm. Mild central vascular congestion without pulmonary consolidation, effusion or pneumothorax. No acute osseous abnormality. Degenerative changes are present along the dorsal spine. IMPRESSION: Cardiomegaly with mild  vascular congestion. Electronically Signed   By: Ashley Royalty M.D.   On: 08/15/2017 02:08   Ct Angio Chest Pe W Or Wo Contrast  Result Date: 08/15/2017 CLINICAL DATA:  Elevated D-dimer. Assess for pulmonary embolus. EXAM: CT ANGIOGRAPHY CHEST WITH CONTRAST TECHNIQUE: Multidetector CT imaging of the chest was performed using the standard protocol during bolus administration of intravenous contrast.  Multiplanar CT image reconstructions and MIPs were obtained to evaluate the vascular anatomy. CONTRAST:  193mL ISOVUE-370 IOPAMIDOL (ISOVUE-370) INJECTION 76% COMPARISON:  Chest radiograph performed earlier today at 1:28 a.m., and CT of the chest performed 12/09/2009 FINDINGS: Cardiovascular:  There is no evidence of pulmonary embolus. There is aneurysmal dilatation of the ascending thoracic aorta to 4.9 cm in AP dimension. The aneurysmal dilatation resolves at the aortic arch. The great vessels are grossly unremarkable. Mediastinum/Nodes: The mediastinum is unremarkable in appearance. No mediastinal lymphadenopathy is seen. The visualized portions of the thyroid gland are unremarkable. No axillary lymphadenopathy is seen. Lungs/Pleura: Mild bilateral dependent subsegmental atelectasis is noted. No pleural effusion or pneumothorax is seen. No masses are identified. Upper Abdomen: The visualized portions of the liver and spleen are unremarkable. The patient is status post sleeve gastrectomy. Musculoskeletal: No acute osseous abnormalities are identified. The visualized musculature is unremarkable in appearance. Review of the MIP images confirms the above findings. IMPRESSION: 1. No evidence of pulmonary embolus. 2. Aneurysmal dilatation of the ascending thoracic aorta to 4.9 cm in AP dimension. Ascending thoracic aortic aneurysm. Recommend semi-annual imaging followup by CTA or MRA and referral to cardiothoracic surgery if not already obtained. This recommendation follows 2010 ACCF/AHA/AATS/ACR/ASA/SCA/SCAI/SIR/STS/SVM  Guidelines for the Diagnosis and Management of Patients With Thoracic Aortic Disease. Circulation. 2010; 121: S496-P591 3. Mild bilateral dependent subsegmental atelectasis noted; lungs otherwise clear. Electronically Signed   By: Garald Balding M.D.   On: 08/15/2017 04:53     EKG: Independently reviewed.  Sinus rhythm, artificial effects, QTC 405, nonspecific T wave change.   Assessment/Plan Principal Problem:   Chest pain Active Problems:   Essential hypertension   Morbid obesity (HCC)   History of GI bleed - PUD 1989   HLD (hyperlipidemia)   GERD (gastroesophageal reflux disease)   Suprapubic abdominal pain   Ascending aortic aneurysm (HCC)   Chest pain: Chest x-ray has no infiltration for pneumonia.  CT angiogram is negative for PE, but showed ascending aortic aneurysm. Will need to r/o ACS. Pt has  patient has stress due to wife passed away. Trop negative.  - will place on Tele bed for obs - cycle CE q6 x3 and repeat EKG in the am  - prn Nitroglycerin, Morphine, and aspirin, lipitor  - Risk factor stratification: will check FLP, UDS and A1C  - 2d echo - LE doppler to r/o DVT - prn xanax for anxiety - inpt card consult was requested via Epic  HTN: Blood pressure 134/72 -Continue home medications: Metoprolol with holding parameters for heart rate less than 6o -IV hydralazine prn  GERd and History of GI bleed - PUD 1989 -on protonix  HLD: -Lipitor  Suprapubic abdominal pain: Unclear etiology.  Patient has small ventral hernia, but there is no tenderness over the hernia area. -Check urinalysis to rule out UTI  Ascending aortic aneurysm [facility]): -may give referral to thoracic surgeon   DVT ppx: SQ Lovenox Code Status: Full code Family Communication:   Yes, patient's daughter at bed side Disposition Plan:  Anticipate discharge back to previous home environment Consults called:  None Admission status: Obs / tele   Date of Service 08/15/2017    Ivor Costa [facility] Pager [phone]  If 7PM-7AM, please contact night-coverage [email or web address] Password [facility] 08/15/2017, 5:45 AM

## 2017-08-15 NOTE — Progress Notes (Signed)
VASCULAR LAB PRELIMINARY  PRELIMINARY  PRELIMINARY  PRELIMINARY  Bilateral lower extremity venous duplex completed.    Preliminary report:  There is no obvious evidence of DVT or SVT noted in the visualized veins of the bilateral lower extremities.   Yailene Badia, RVT 08/15/2017, 6:02 PM

## 2017-08-15 NOTE — ED Notes (Signed)
Patient transported to X-ray 

## 2017-08-15 NOTE — Progress Notes (Signed)
Patient presented for Lexiscan. Tolerated procedure well. 2 days study. Resting image tomorrow. Pending final stress imaging result.

## 2017-08-15 NOTE — ED Triage Notes (Signed)
Pt bib [facility] from home d/t substernal CP that started 2 hours ago. +Nausea, +Shob, +diaphoresis.  Pt given 324 mg asa, nitro x 3 and 4 mg IV zofran.  Per EMS pt's wife died 07/23/22 however pt has denied being anxious until now.

## 2017-08-15 NOTE — ED Provider Notes (Signed)
TIME SEEN: 12:46 AM  CHIEF COMPLAINT: Chest pain  HPI: Patient is a 67 year old male with history of obesity, hypertension who presents to the emergency department chest pain.  States symptoms started approximately an hour prior to arrival while at rest.  Describes as a left-sided chest heaviness like someone is sitting on his chest without radiation.  States he feels short of breath and shaking.  Felt dizzy after aspirin.  Denies nausea or vomiting but was given Zofran with EMS.  Denies diaphoresis.  Reports stress test many years ago.  No history of cath.  Denies recent fever or cough.  Reports chronic lower extremity swelling that is unchanged.  No calf pain.  Given aspirin and 3 nitroglycerin with EMS with no change in symptoms.  States he just recently lost his wife.  ROS: See HPI Constitutional: no fever  Eyes: no drainage  ENT: no runny nose   Cardiovascular:   chest pain  Resp:  SOB  GI: no vomiting GU: no dysuria Integumentary: no rash  Allergy: no hives  Musculoskeletal: no leg swelling  Neurological: no slurred speech ROS otherwise negative  PAST MEDICAL HISTORY/PAST SURGICAL HISTORY:  Past Medical History:  Diagnosis Date  . Allergy   . Arthritis   . Chronic back pain   . Chronic headache 07/22/2017   "sinus" s/p sinus surgery with minimal relief.  . Depression   . GERD (gastroesophageal reflux disease)    takes Protonix daily  . GI bleeding 1989  . History of acute respiratory failure 07/22/2017   Due to unintentional OD (chronic opioids) and pna: 2011 ARF due to rhabdo  . History of blood transfusion    no abnormal reaction noted  . History of gastric ulcer    x2  . Hypertension   . Memory disorder 04/30/2015  . Obesity, Class III, BMI 40-49.9 (morbid obesity) (H[facility])     MEDICATIONS:  Prior to Admission medications   Medication Sig Start Date End Date Taking? Authorizing Provider  atorvastatin (LIPITOR) 10 MG tablet Take 1 tablet (10 mg total) by mouth daily.  08/13/17   Leamon Arnt, MD  cetirizine (ZYRTEC) 10 MG tablet Take 1 tablet (10 mg total) by mouth daily. 07/22/17   Leamon Arnt, MD  fluticasone (FLONASE) 50 MCG/ACT nasal spray Place 2 sprays into both nostrils daily. 07/22/17   Leamon Arnt, MD  ibuprofen (ADVIL,MOTRIN) 200 MG tablet Take 800 mg by mouth every 6 (six) hours as needed for moderate pain.    [provider]  metoprolol tartrate (LOPRESSOR) 25 MG tablet Take 1 tablet (25 mg total) by mouth 2 (two) times daily. 07/22/17   Leamon Arnt, MD  omeprazole (PRILOSEC) 20 MG capsule Take 1 capsule (20 mg total) by mouth daily. 07/22/17   Leamon Arnt, MD    ALLERGIES:  No Known Allergies  SOCIAL HISTORY:  Social History   Tobacco Use  . Smoking status: Never Smoker  . Smokeless tobacco: Never Used  Substance Use Topics  . Alcohol use: No    Comment: rare    FAMILY HISTORY: Family History  Problem Relation Age of Onset  . Stroke Father   . Asthma Other   . Depression Other   . Diabetes Other   . Heart disease Other   . Hypertension Other   . Stroke Other   . Drug abuse Daughter   . Dementia Neg Hx     EXAM: BP (!) 166/92 (BP Location: Right Arm)   Pulse 75  Temp 98 F (36.7 C) (Oral)   Resp (!) 26   SpO2 96%  CONSTITUTIONAL: Alert and oriented and responds appropriately to questions.  Obese, shaking, moaning, appears very anxious HEAD: Normocephalic EYES: Conjunctivae clear, pupils appear equal, EOMI ENT: normal nose; moist mucous membranes NECK: Supple, no meningismus, no nuchal rigidity, no LAD  CARD: RRR; S1 and S2 appreciated; no murmurs, no clicks, no rubs, no gallops RESP: Normal chest excursion without splinting or tachypnea; breath sounds clear and equal bilaterally; no wheezes, no rhonchi, no rales, no hypoxia or respiratory distress, speaking full sentences ABD/GI: Normal bowel sounds; non-distended; soft, non-tender, no rebound, no guarding, no peritoneal signs, no  hepatosplenomegaly BACK:  The back appears normal and is non-tender to palpation, there is no CVA tenderness EXT: Normal ROM in all joints; non-tender to palpation; no edema; normal capillary refill; no cyanosis, no calf tenderness or swelling    SKIN: Normal color for age and race; warm; no rash NEURO: Moves all extremities equally PSYCH: Patient appears very anxious.  He is tremulous.  MEDICAL DECISION MAKING: Patient here with chest pain.  Does have some risk factors for ACS.  Differential also includes PE, dissection.  Will obtain cardiac labs including d-dimer, chest x-ray.  EKG shows no new ischemic abnormality.  Will give Ativan as I think this may be related somewhat to anxiety given recent death of his wife and reassess.  ED PROGRESS: Patient's troponin is negative.  Age-adjusted d-dimer is also negative.  Patient seems to be calming down her blood pressure improving after IV Ativan.  Chest x-ray shows cardiomegaly with mild vascular congestion.  We will add on BNP.  Does not appear significantly volume overloaded at this time.  He reports no improvement in pain after Ativan but is now much more calm.  Will give dose of fentanyl given no improvement with nitroglycerin.  Will discuss with hospitalist for admission for chest pain rule out.    2:57 AM Discussed patient's case with hospitalist, Dr. Blaine Hamper.  I have recommended admission and patient (and family if present) agree with this plan. Admitting physician will place admission orders.   I reviewed all nursing notes, vitals, pertinent previous records, EKGs, lab and urine results, imaging (as available).    EKG Interpretation  Date/Time:  Saturday Aug 15 2017 00:30:21 EDT Ventricular Rate:  74 PR Interval:    QRS Duration: 102 QT Interval:  375 QTC Calculation: 405 R Axis:   40 Text Interpretation:  NSR with artifact Ventricular premature complex Borderline T abnormalities, inferior leads No significant change since last tracing  Confirmed by Kunta Hilleary, Cyril Mourning [phone]) on 08/15/2017 12:42:06 AM         Randy Grimes, Randy Bison, DO 08/15/17 [id number]

## 2017-08-15 NOTE — Progress Notes (Signed)
67 year old male admitted early this morning with atypical chest pain.  Work-up showed negative cardiac enzymes no acute EKG changes.  CT angiogram done because of elevated d-dimer shows no evidence of pulmonary embolism but he does have a 4.9 cm thoracic aortic aneurysm no dissection noted.  Patient has been seen by cardiology and had the first section of [place] ordered.  Patient is under a lot of stress from recent wife dying recently a week ago.  When I saw him the wife sister was in the room with the patient.  Patient continued to complain of chest pain which was not relieved with the 4 mg of morphine.  His pain is reproducible with palpation.  When I asked him how is his breathing he said it is labored.  He did not look in any distress his vital signs were stable.  So echocardiogram is pending and stress test is pending at this time.

## 2017-08-16 ENCOUNTER — Observation Stay (HOSPITAL_COMMUNITY): Payer: Medicare Other

## 2017-08-16 ENCOUNTER — Observation Stay (HOSPITAL_BASED_OUTPATIENT_CLINIC_OR_DEPARTMENT_OTHER): Payer: Medicare Other

## 2017-08-16 DIAGNOSIS — R072 Precordial pain: Secondary | ICD-10-CM | POA: Diagnosis not present

## 2017-08-16 DIAGNOSIS — I5030 Unspecified diastolic (congestive) heart failure: Secondary | ICD-10-CM | POA: Diagnosis not present

## 2017-08-16 LAB — URINALYSIS, ROUTINE W REFLEX MICROSCOPIC
BILIRUBIN URINE: NEGATIVE
GLUCOSE, UA: NEGATIVE mg/dL
KETONES UR: 5 mg/dL — AB
LEUKOCYTES UA: NEGATIVE
NITRITE: NEGATIVE
PROTEIN: 30 mg/dL — AB
Specific Gravity, Urine: 1.024 (ref 1.005–1.030)
pH: 6 (ref 5.0–8.0)

## 2017-08-16 LAB — BASIC METABOLIC PANEL
ANION GAP: 9 (ref 5–15)
BUN: 11 mg/dL (ref 6–20)
CO2: 24 mmol/L (ref 22–32)
Calcium: 8.1 mg/dL — ABNORMAL LOW (ref 8.9–10.3)
Chloride: 100 mmol/L — ABNORMAL LOW (ref 101–111)
Creatinine, Ser: 0.84 mg/dL (ref 0.61–1.24)
GFR calc non Af Amer: >60 mL/min (ref >60)
Glucose, Bld: 144 mg/dL — ABNORMAL HIGH (ref 65–99)
POTASSIUM: 4 mmol/L (ref 3.5–5.1)
SODIUM: 133 mmol/L — AB (ref 135–145)

## 2017-08-16 LAB — NM MYOCAR MULTI W/SPECT W/WALL MOTION / EF
CSEPEDS: 59 s
CSEPEW: 1 METS
CSEPPHR: 78 {beats}/min
Exercise duration (min): 6 min
Rest HR: 62 {beats}/min

## 2017-08-16 LAB — ECHOCARDIOGRAM COMPLETE
Height: 72 in
WEIGHTICAEL: 4945.36 [oz_av]

## 2017-08-16 MED ORDER — LOSARTAN POTASSIUM 25 MG PO TABS: 25.0000 mg | ORAL_TABLET | Freq: Every day | ORAL | 0 refills | Status: DC

## 2017-08-16 MED ORDER — TECHNETIUM TC 99M TETROFOSMIN IV KIT
30.0000 | PACK | Freq: Once | INTRAVENOUS | Status: AC | PRN
  Administered 2017-08-16: 30 via INTRAVENOUS

## 2017-08-16 MED ORDER — SODIUM CHLORIDE 0.9 % IV SOLN
INTRAVENOUS | Status: DC
  Administered 2017-08-16: 03:00:00 via INTRAVENOUS

## 2017-08-16 MED ORDER — PERFLUTREN LIPID MICROSPHERE
1.0000 mL | INTRAVENOUS | Status: AC | PRN
  Administered 2017-08-16: 2 mL via INTRAVENOUS
  Filled 2017-08-16: qty 10

## 2017-08-16 NOTE — Progress Notes (Signed)
Progress Note  Patient Name: Randy Grimes Date of Encounter: 08/16/2017  Primary Cardiologist: Dr Stanford Breed  Subjective   Still with CP; states breathing is labored; complains of HA  Inpatient Medications    Scheduled Meds: . aspirin EC  81 mg Oral Daily  . atorvastatin  10 mg Oral Daily  . Chlorhexidine Gluconate Cloth  6 each Topical Q0600  . enoxaparin (LOVENOX) injection  40 mg Subcutaneous Q24H  . losartan  25 mg Oral Daily  . metoprolol tartrate  25 mg Oral BID  . mupirocin ointment  1 application Nasal BID  . pantoprazole  40 mg Oral Daily  . pneumococcal 23 valent vaccine  0.5 mL Intramuscular Tomorrow-1000   Continuous Infusions: . sodium chloride 50 mL/hr at 08/16/17 0310   PRN Meds: acetaminophen, ALPRAZolam, hydrALAZINE, morphine injection, nitroGLYCERIN, ondansetron (ZOFRAN) IV, perflutren lipid microspheres (DEFINITY) IV suspension, zolpidem   Vital Signs    Vitals:   08/15/17 2227 08/15/17 2304 08/16/17 0353 08/16/17 0724  BP: (!) 141/72 (!) 141/72 (!) 154/77 140/75  Pulse: 71 69 69 73  Resp:  17 (!) 24 (!) 25  Temp:  98.8 F (37.1 C) 99.2 F (37.3 C) 98.6 F (37 C)  TempSrc:  Oral Oral Oral  SpO2:  99% 96% 95%  Weight:      Height:        Intake/Output Summary (Last 24 hours) at 08/16/2017 1104 Last data filed at 08/16/2017 0600 Gross per 24 hour  Intake 141.67 ml  Output 1200 ml  Net -1058.33 ml   Filed Weights   08/15/17 0814  Weight: (!) 309 lb 1.4 oz (140.2 kg)    Telemetry    Sinus - Personally Reviewed   Physical Exam   GEN: No acute distress.   Neck: No JVD Cardiac: RRR, no murmurs, rubs, or gallops.  Respiratory: Clear to auscultation bilaterally. GI: Soft, nontender, non-distended  MS: No edema Neuro:  Nonfocal  Psych: Normal affect   Labs    Chemistry Recent Labs  Lab 08/15/17 0048 08/16/17 0253  NA 139 133*  K 3.9 4.0  CL 107 100*  CO2 25 24  GLUCOSE 141* 144*  BUN 12 11  CREATININE 0.87 0.84    CALCIUM 8.8* 8.1*  GFRNONAA >60 >60  GFRAA >60 >60  ANIONGAP 7 9     Hematology Recent Labs  Lab 08/15/17 0048  WBC 4.7  RBC 4.67  HGB 11.2*  HCT 37.3*  MCV 79.9  MCH 24.0*  MCHC 30.0  RDW 15.6*  PLT 222    Cardiac Enzymes Recent Labs  Lab 08/15/17 0349 08/15/17 0924 08/15/17 1513  TROPONINI <0.03 <0.03 <0.03    Recent Labs  Lab 08/15/17 0055  TROPIPOC 0.01     BNP Recent Labs  Lab 08/15/17 0048  BNP 39.1     DDimer  Recent Labs  Lab 08/15/17 0048  DDIMER 0.59*     Radiology    Dg Chest 2 View  Result Date: 08/15/2017 CLINICAL DATA:  Substernal chest pain starting 2 hours ago with nausea and dyspnea. EXAM: CHEST - 2 VIEW COMPARISON:  07/22/2017 FINDINGS: AP view of the chest. Mild cardiac enlargement. Slightly uncoiled thoracic aorta without aneurysm. Mild central vascular congestion without pulmonary consolidation, effusion or pneumothorax. No acute osseous abnormality. Degenerative changes are present along the dorsal spine. IMPRESSION: Cardiomegaly with mild vascular congestion. Electronically Signed   By: Ashley Royalty M.D.   On: 08/15/2017 02:08   Ct Angio Chest Pe W Or  Wo Contrast  Result Date: 08/15/2017 CLINICAL DATA:  Elevated D-dimer. Assess for pulmonary embolus. EXAM: CT ANGIOGRAPHY CHEST WITH CONTRAST TECHNIQUE: Multidetector CT imaging of the chest was performed using the standard protocol during bolus administration of intravenous contrast. Multiplanar CT image reconstructions and MIPs were obtained to evaluate the vascular anatomy. CONTRAST:  165mL ISOVUE-370 IOPAMIDOL (ISOVUE-370) INJECTION 76% COMPARISON:  Chest radiograph performed earlier today at 1:28 a.m., and CT of the chest performed 12/09/2009 FINDINGS: Cardiovascular:  There is no evidence of pulmonary embolus. There is aneurysmal dilatation of the ascending thoracic aorta to 4.9 cm in AP dimension. The aneurysmal dilatation resolves at the aortic arch. The great vessels are grossly  unremarkable. Mediastinum/Nodes: The mediastinum is unremarkable in appearance. No mediastinal lymphadenopathy is seen. The visualized portions of the thyroid gland are unremarkable. No axillary lymphadenopathy is seen. Lungs/Pleura: Mild bilateral dependent subsegmental atelectasis is noted. No pleural effusion or pneumothorax is seen. No masses are identified. Upper Abdomen: The visualized portions of the liver and spleen are unremarkable. The patient is status post sleeve gastrectomy. Musculoskeletal: No acute osseous abnormalities are identified. The visualized musculature is unremarkable in appearance. Review of the MIP images confirms the above findings. IMPRESSION: 1. No evidence of pulmonary embolus. 2. Aneurysmal dilatation of the ascending thoracic aorta to 4.9 cm in AP dimension. Ascending thoracic aortic aneurysm. Recommend semi-annual imaging followup by CTA or MRA and referral to cardiothoracic surgery if not already obtained. This recommendation follows 2010 ACCF/AHA/AATS/ACR/ASA/SCA/SCAI/SIR/STS/SVM Guidelines for the Diagnosis and Management of Patients With Thoracic Aortic Disease. Circulation. 2010; 121: E675-Q492 3. Mild bilateral dependent subsegmental atelectasis noted; lungs otherwise clear. Electronically Signed   By: Garald Balding M.D.   On: 08/15/2017 04:53    Patient Profile     67 y.o. male with history of hypertension, prior  gastric bypass surgery, chronic back pain admitted with chest pain.  CTA showed thoracic aortic aneurysm measuring 4.9 cm.  Assessment & Plan    1 chest pain-symptoms are atypical.  They have been persistent and enzymes are negative.  Likely musculoskeletal.  We will await final results of nuclear study.  If normal or low risk would plan medical therapy. 2 thoracic aortic aneurysm-await results of echocardiogram to rule out bicuspid aortic valve.  If indeed he has a bicuspid valve would need surgical evaluation for repair of aneurysm.  Otherwise we will  plan repeat CTA or MRA in 6 months.   3 hypertension-blood pressure is elevated.  Increase Cozaar to 50 mg daily and follow. 4 hyperlipidemia-continue statin.  5 elevated hemoglobin A1c-Per primary care.   For questions or updates, please contact [place] Please consult [email or web address] for contact info under Cardiology/STEMI.      Signed, Kirk Ruths, MD  08/16/2017, 11:04 AM

## 2017-08-16 NOTE — Progress Notes (Signed)
Pt c/o that it was hurting to void. Urine is dark in color.  MD made aware and gave orders. Orders followed. Will continue to monitor pt.

## 2017-08-16 NOTE — Progress Notes (Signed)
  Echocardiogram 2D Echocardiogram w/ definity has been performed.  Reata Petrov L Androw 08/16/2017, 10:54 AM

## 2017-08-16 NOTE — Discharge Summary (Signed)
Physician Discharge Summary  Randy Grimes FVC:[id number] DOB: [birth year] DOA: 08/15/2017  PCP: Leamon Arnt, MD  Admit date: 08/15/2017 Discharge date: 08/16/2017  Admitted From: Home Disposition:  Home  Discharge Condition:Stable CODE STATUS:FULL Diet recommendation: Heart Healthy  Brief/Interim Summary: Patient is 67 year old male admitted for  atypical chest pain. Work-up showed negative cardiac enzymes, no acute EKG changes.  CT angiogram done because of elevated d-dimer shows no evidence of pulmonary embolism but he does have a 4.9 cm thoracic aortic aneurysm no dissection noted.  Patient has been seen by cardiology and had the first section of [place] ordered.  Patient is under a lot of stress from recent wife dying recently a week ago.   His pain is reproducible with palpation.  Echocardiogram showed normal left ventricular function, ejection fraction of 60 to 65%, mild LVH, grade 1 diastolic dysfunction. Nuclear stress test suggested  low risk stress nuclear study with fixed inferior and inferoseptal defect likely related to diaphragmatic attenuation. Patient does not complain of any chest pain at present.  Most likely the chest pain was musculoskeletal in etiology. He is stable for discharge to home today.  He is hemodynamically stable.  He will follow-up with cardiothoracic surgery as an outpatient for the evaluation of his ascending thoracic aortic aneurysm and also do CTA or MRA of the chest for follow-up in 6 months.  Following problems were addressed during his hospitalization:  Chest pain: Chest x-ray has no infiltration for pneumonia.  CT angiogram is negative for PE, but showed ascending aortic aneurysm.Trop negative. Chest pain resolved today.  Patient seen by cardiology.  Echo and nuclear stress findings as above.  HTN: Blood pressure stable.  Added losartan for better blood pressure control.  GERd and History of GI bleed - PUD 1989 -on protonix  HLD: - Continue  Lipitor  Suprapubic abdominal pain: Unclear etiology.  Patient has small ventral hernia, but there is no tenderness over the hernia area.  Ascending aortic aneurysm [facility]): -Referral to thoracic surgery     Discharge Diagnoses:  Principal Problem:   Chest pain Active Problems:   Essential hypertension   Morbid obesity ([place])   History of GI bleed - PUD 1989   HLD (hyperlipidemia)   GERD (gastroesophageal reflux disease)   Suprapubic abdominal pain   Ascending aortic aneurysm [facility])    Discharge Instructions  Discharge Instructions    Ambulatory referral to Cardiothoracic Surgery   Complete by:  As directed    Diet - low sodium heart healthy   Complete by:  As directed    Discharge instructions   Complete by:  As directed    1) Follow up with your PCP in a week. 2) Follow up with cardio thoracic surgery as an outpatient in 4 weeks.  Name and number of the provider has been attached. 3) Do a CTA or MRA chest in 6 months to follow-up for your ascending thoracic aortic aneurysm. 4) Take prescribed medications as instructed   Increase activity slowly   Complete by:  As directed      Allergies as of 08/16/2017   No Known Allergies     Medication List    TAKE these medications   atorvastatin 10 MG tablet Commonly known as:  LIPITOR Take 1 tablet (10 mg total) by mouth daily.   cetirizine 10 MG tablet Commonly known as:  ZYRTEC Take 1 tablet (10 mg total) by mouth daily.   fluticasone 50 MCG/ACT nasal spray Commonly known as:  FLONASE Place 2  sprays into both nostrils daily.   ibuprofen 200 MG tablet Commonly known as:  ADVIL,MOTRIN Take 800 mg by mouth every 6 (six) hours as needed for moderate pain.   losartan 25 MG tablet Commonly known as:  COZAAR Take 1 tablet (25 mg total) by mouth daily. Start taking on:  08/17/2017   metoprolol tartrate 25 MG tablet Commonly known as:  LOPRESSOR Take 1 tablet (25 mg total) by mouth 2 (two) times daily.    omeprazole 20 MG capsule Commonly known as:  PRILOSEC Take 1 capsule (20 mg total) by mouth daily.      Follow-up Information    Leamon Arnt, MD. Schedule an appointment as soon as possible for a visit in 1 week(s).   Specialty:  Family Medicine Contact information: [address] [place] [address] [phone]        Grace Isaac, MD. Schedule an appointment as soon as possible for a visit in 4 week(s).   Specialty:  Cardiothoracic Surgery Contact information: [place] [address] [phone]          No Known Allergies  Consultations:  Cardiology   Procedures/Studies: Dg Chest 2 View  Result Date: 08/15/2017 CLINICAL DATA:  Substernal chest pain starting 2 hours ago with nausea and dyspnea. EXAM: CHEST - 2 VIEW COMPARISON:  07/22/2017 FINDINGS: AP view of the chest. Mild cardiac enlargement. Slightly uncoiled thoracic aorta without aneurysm. Mild central vascular congestion without pulmonary consolidation, effusion or pneumothorax. No acute osseous abnormality. Degenerative changes are present along the dorsal spine. IMPRESSION: Cardiomegaly with mild vascular congestion. Electronically Signed   By: Ashley Royalty M.D.   On: 08/15/2017 02:08   Dg Chest 2 View  Result Date: 07/22/2017 CLINICAL DATA:  Persistent cough and right upper back soreness for 3 months. EXAM: CHEST - 2 VIEW COMPARISON:  06/17/2014 FINDINGS: The cardiomediastinal silhouette is unchanged. Heart size is within normal limits. The lungs are clear. No pleural effusion or pneumothorax is identified. No acute osseous abnormality is seen. IMPRESSION: No active cardiopulmonary disease. Electronically Signed   By: Logan Bores M.D.   On: 07/22/2017 16:15   Ct Angio Chest Pe W Or Wo Contrast  Result Date: 08/15/2017 CLINICAL DATA:  Elevated D-dimer. Assess for pulmonary embolus. EXAM: CT ANGIOGRAPHY CHEST WITH CONTRAST TECHNIQUE: Multidetector CT imaging of the  chest was performed using the standard protocol during bolus administration of intravenous contrast. Multiplanar CT image reconstructions and MIPs were obtained to evaluate the vascular anatomy. CONTRAST:  12mL ISOVUE-370 IOPAMIDOL (ISOVUE-370) INJECTION 76% COMPARISON:  Chest radiograph performed earlier today at 1:28 a.m., and CT of the chest performed 12/09/2009 FINDINGS: Cardiovascular:  There is no evidence of pulmonary embolus. There is aneurysmal dilatation of the ascending thoracic aorta to 4.9 cm in AP dimension. The aneurysmal dilatation resolves at the aortic arch. The great vessels are grossly unremarkable. Mediastinum/Nodes: The mediastinum is unremarkable in appearance. No mediastinal lymphadenopathy is seen. The visualized portions of the thyroid gland are unremarkable. No axillary lymphadenopathy is seen. Lungs/Pleura: Mild bilateral dependent subsegmental atelectasis is noted. No pleural effusion or pneumothorax is seen. No masses are identified. Upper Abdomen: The visualized portions of the liver and spleen are unremarkable. The patient is status post sleeve gastrectomy. Musculoskeletal: No acute osseous abnormalities are identified. The visualized musculature is unremarkable in appearance. Review of the MIP images confirms the above findings. IMPRESSION: 1. No evidence of pulmonary embolus. 2. Aneurysmal dilatation of the ascending thoracic aorta to 4.9  cm in AP dimension. Ascending thoracic aortic aneurysm. Recommend semi-annual imaging followup by CTA or MRA and referral to cardiothoracic surgery if not already obtained. This recommendation follows 2010 ACCF/AHA/AATS/ACR/ASA/SCA/SCAI/SIR/STS/SVM Guidelines for the Diagnosis and Management of Patients With Thoracic Aortic Disease. Circulation. 2010; 121: G644-I347 3. Mild bilateral dependent subsegmental atelectasis noted; lungs otherwise clear. Electronically Signed   By: Garald Balding M.D.   On: 08/15/2017 04:53   Nm Myocar Multi W/spect  W/wall Motion / Ef  Result Date: 08/16/2017  There was no ST segment deviation noted during stress.  T wave inversion was noted during stress in the II, III, aVF, V4, V5 and V6 leads. T wave inversion persisted.  Defect 1: There is a small defect of moderate severity present in the basal inferoseptal and basal inferior location.  This is a low risk study.  The left ventricular ejection fraction is mildly decreased (45-54%).  Nuclear stress EF: 47%.  Mildly abnormal, low risk stress nuclear study with fixed inferior and inferoseptal defect likely related to diaphragmatic attenuation; cannot R/O small prior infarct; no significant iscchemia; EF 47 with mild global hypokinesis (suggest echo to further assess LV function); mild LVE; tid noted (1.30) but less specific given lexiscan stress.       Subjective: Patient seen and examined the bedside this morning.  Denies any chest pain.  Stable for discharge at home.  Discharge Exam: Vitals:   08/16/17 1153 08/16/17 1155  BP: 123/71 123/71  Pulse: 60 (!) 56  Resp: 16 (!) 23  Temp: 98.7 F (37.1 C)   SpO2: 95% 98%   Vitals:   08/16/17 0353 08/16/17 0724 08/16/17 1153 08/16/17 1155  BP: (!) 154/77 140/75 123/71 123/71  Pulse: 69 73 60 (!) 56  Resp: (!) 24 (!) 25 16 (!) 23  Temp: 99.2 F (37.3 C) 98.6 F (37 C) 98.7 F (37.1 C)   TempSrc: Oral Oral Oral   SpO2: 96% 95% 95% 98%  Weight:      Height:        General: Pt is alert, awake, not in acute distress Cardiovascular: RRR, S1/S2 +, no rubs, no gallops Respiratory: CTA bilaterally, no wheezing, no rhonchi Abdominal: Soft, NT, ND, bowel sounds + Extremities: Trace edema, no cyanosis    The results of significant diagnostics from this hospitalization (including imaging, microbiology, ancillary and laboratory) are listed below for reference.     Microbiology: Recent Results (from the past 240 hour(s))  MRSA PCR Screening     Status: Abnormal   Collection Time: 08/15/17   6:57 AM  Result Value Ref Range Status   MRSA by PCR POSITIVE (A) NEGATIVE Final    Comment:        The GeneXpert MRSA Assay (FDA approved for NASAL specimens only), is one component of a comprehensive MRSA colonization surveillance program. It is not intended to diagnose MRSA infection nor to guide or monitor treatment for MRSA infections. RESULT CALLED TO, READ BACK BY AND VERIFIED WITH: E.STANFIELD RN AT 1100 08/15/17 BY A.DAVIS Performed at [facility], [place] [address] [place], [place] [address]      Labs: BNP (last 3 results) Recent Labs    08/15/17 0048  BNP 63.8   Basic Metabolic Panel: Recent Labs  Lab 08/15/17 0048 08/16/17 0253  NA 139 133*  K 3.9 4.0  CL 107 100*  CO2 25 24  GLUCOSE 141* 144*  BUN 12 11  CREATININE 0.87 0.84  CALCIUM 8.8* 8.1*   Liver Function Tests:  No results for input(s): AST, ALT, ALKPHOS, BILITOT, PROT, ALBUMIN in the last 168 hours. No results for input(s): LIPASE, AMYLASE in the last 168 hours. No results for input(s): AMMONIA in the last 168 hours. CBC: Recent Labs  Lab 08/15/17 0048  WBC 4.7  HGB 11.2*  HCT 37.3*  MCV 79.9  PLT 222   Cardiac Enzymes: Recent Labs  Lab 08/15/17 0349 08/15/17 0924 08/15/17 1513  TROPONINI <0.03 <0.03 <0.03   BNP: Invalid input(s): POCBNP CBG: No results for input(s): GLUCAP in the last 168 hours. D-Dimer Recent Labs    08/15/17 0048  DDIMER 0.59*   Hgb A1c Recent Labs    08/15/17 0350  HGBA1C 6.5*   Lipid Profile Recent Labs    08/15/17 0352  CHOL 194  HDL 48  LDLCALC 134*  TRIG 58  CHOLHDL 4.0   Thyroid function studies No results for input(s): TSH, T4TOTAL, T3FREE, THYROIDAB in the last 72 hours.  Invalid input(s): FREET3 Anemia work up No results for input(s): VITAMINB12, FOLATE, FERRITIN, TIBC, IRON, RETICCTPCT in the last 72 hours. Urinalysis    Component Value Date/Time   COLORURINE YELLOW 08/16/2017 0246   APPEARANCEUR CLEAR 08/16/2017 0246    LABSPEC 1.024 08/16/2017 0246   PHURINE 6.0 08/16/2017 0246   GLUCOSEU NEGATIVE 08/16/2017 0246   HGBUR LARGE (A) 08/16/2017 0246   BILIRUBINUR NEGATIVE 08/16/2017 0246   BILIRUBINUR 2+ 07/22/2017 1058   KETONESUR 5 (A) 08/16/2017 0246   PROTEINUR 30 (A) 08/16/2017 0246   UROBILINOGEN 0.2 07/22/2017 1058   UROBILINOGEN 0.2 02/07/2014 0900   NITRITE NEGATIVE 08/16/2017 0246   LEUKOCYTESUR NEGATIVE 08/16/2017 0246   Sepsis Labs Invalid input(s): PROCALCITONIN,  WBC,  LACTICIDVEN Microbiology Recent Results (from the past 240 hour(s))  MRSA PCR Screening     Status: Abnormal   Collection Time: 08/15/17  6:57 AM  Result Value Ref Range Status   MRSA by PCR POSITIVE (A) NEGATIVE Final    Comment:        The GeneXpert MRSA Assay (FDA approved for NASAL specimens only), is one component of a comprehensive MRSA colonization surveillance program. It is not intended to diagnose MRSA infection nor to guide or monitor treatment for MRSA infections. RESULT CALLED TO, READ BACK BY AND VERIFIED WITH: E.STANFIELD RN AT 1100 08/15/17 BY A.DAVIS Performed at [facility], [place] [address] [place], [place] [address]     Please note: You were cared for by a hospitalist during your hospital stay. Once you are discharged, your primary care physician will handle any further medical issues. Please note that NO REFILLS for any discharge medications will be authorized once you are discharged, as it is imperative that you return to your primary care physician (or establish a relationship with a primary care physician if you do not have one) for your post hospital discharge needs so that they can reassess your need for medications and monitor your lab values.    Time coordinating discharge: 40 minutes  SIGNED:   Shelly Coss, MD  [facility] 08/16/2017, 1:59 PM Pager [phone]  If 7PM-7AM, please contact night-coverage [email or web address] Password [email or web address]

## 2017-08-16 NOTE — Progress Notes (Signed)
Discharge note. Patient and daughter educated at bedside. RN educated on medications and when to take them, follow-up appointments, and diet and activity recommendations. PIV removed without complications.   Pt discharged in wheelchair with NT.   Basil Dess, RN

## 2017-08-17 ENCOUNTER — Telehealth: Payer: Self-pay

## 2017-08-17 LAB — HIV ANTIBODY (ROUTINE TESTING W REFLEX): HIV SCREEN 4TH GENERATION: NONREACTIVE

## 2017-08-17 NOTE — Telephone Encounter (Signed)
Transition Care Management Follow-up Telephone Call  Admit date: 08/15/2017 Discharge date: 08/16/2017 Principal Problem:  Chest pain   How have you been since you were released from the hospital? "Ok, I guess"   Do you understand why you were in the hospital? yes, chest pain.    Do you understand the discharge instructions? yes   Where were you discharged to? Home. Daughter with patient, son lives in apartment above pt.    Items Reviewed:  Medications reviewed: no, advised to bring medications to appt. States he started Lipitor.   Allergies reviewed: yes  Dietary changes reviewed: yes  Referrals reviewed: yes   Functional Questionnaire:   Activities of Daily Living (ADLs):   He states they are independent in the following: ambulation, bathing and hygiene, feeding, continence, grooming, toileting and dressing States they require assistance with the following: None.    Any transportation issues/concerns?: no   Any patient concerns? no   Confirmed importance and date/time of follow-up visits scheduled yes  Provider Appointment booked with PCP on 08/21/17.   Confirmed with patient if condition begins to worsen call PCP or go to the ER.  Patient was given the office number and encouraged to call back with question or concerns.  : yes

## 2017-08-18 ENCOUNTER — Other Ambulatory Visit: Payer: Self-pay | Admitting: *Deleted

## 2017-08-18 DIAGNOSIS — I719 Aortic aneurysm of unspecified site, without rupture: Secondary | ICD-10-CM

## 2017-08-21 ENCOUNTER — Ambulatory Visit (INDEPENDENT_AMBULATORY_CARE_PROVIDER_SITE_OTHER): Payer: Medicare Other

## 2017-08-21 ENCOUNTER — Ambulatory Visit (INDEPENDENT_AMBULATORY_CARE_PROVIDER_SITE_OTHER): Payer: Medicare Other | Admitting: Family Medicine

## 2017-08-21 ENCOUNTER — Encounter: Payer: Self-pay | Admitting: Family Medicine

## 2017-08-21 ENCOUNTER — Other Ambulatory Visit: Payer: Self-pay

## 2017-08-21 VITALS — BP 128/82 | HR 73 | Temp 98.0°F | Ht 72.0 in | Wt 308.0 lb

## 2017-08-21 DIAGNOSIS — M546 Pain in thoracic spine: Secondary | ICD-10-CM | POA: Diagnosis not present

## 2017-08-21 DIAGNOSIS — E871 Hypo-osmolality and hyponatremia: Secondary | ICD-10-CM | POA: Diagnosis not present

## 2017-08-21 DIAGNOSIS — R0782 Intercostal pain: Secondary | ICD-10-CM

## 2017-08-21 DIAGNOSIS — M47816 Spondylosis without myelopathy or radiculopathy, lumbar region: Secondary | ICD-10-CM | POA: Diagnosis not present

## 2017-08-21 DIAGNOSIS — I712 Thoracic aortic aneurysm, without rupture, unspecified: Secondary | ICD-10-CM

## 2017-08-21 DIAGNOSIS — D509 Iron deficiency anemia, unspecified: Secondary | ICD-10-CM | POA: Diagnosis not present

## 2017-08-21 DIAGNOSIS — G8929 Other chronic pain: Secondary | ICD-10-CM | POA: Diagnosis not present

## 2017-08-21 LAB — BASIC METABOLIC PANEL
BUN: 11 mg/dL (ref 6–23)
CHLORIDE: 103 meq/L (ref 96–112)
CO2: 28 meq/L (ref 19–32)
CREATININE: 0.79 mg/dL (ref 0.40–1.50)
Calcium: 8.5 mg/dL (ref 8.4–10.5)
GFR: 104.35 mL/min (ref >60.00)
GLUCOSE: 129 mg/dL — AB (ref 70–99)
Potassium: 4.4 mEq/L (ref 3.5–5.1)
Sodium: 139 mEq/L (ref 135–145)

## 2017-08-21 NOTE — Progress Notes (Signed)
Subjective  CC:  Chief Complaint  Patient presents with  . Hospitalization Follow-up    admitted for Chest Pains, discharged on Sunday 05/19    HPI: Randy Grimes is a 67 y.o. male who presents to the office today to address the problems listed above in the chief complaint. I've personally reviewed recent office visit notes, hospital notes, associated labs and imaging reports and/or pertinent outside office records via chart review or CareEverywhere. Briefly, admitted for atypical XQ:JJHERDE eval negative for ischemia but 4.9 ascending thoracic aortic aneurysm was dxd. Pain thought related to msk origin vs grief.    he remains stable; c/o chest pain worse with mvt. No increase in pain. Mood is flat. Declines antidepressants or therapy at this time. Denies SI.   Labs showed mild hyponatremia, a1c 6.5, and persistent mild anemia.  Assessment  1. Intercostal pain   2. Thoracic aortic aneurysm, without rupture ([place])   3. Iron deficiency anemia, unspecified iron deficiency anemia type      Plan   msk chest pain:  Reassured. Avoid nsaids given h/o GI bleed and anemia  Start iron tx - reinforced this  F/u with [facility] for AA.  Follow up: has f/u with me in July to recheck diabetes and anemia.   No orders of the defined types were placed in this encounter.  No orders of the defined types were placed in this encounter.     I reviewed the patients updated PMH, FH, and SocHx.    Patient Active Problem List   Diagnosis Date Noted  . Thoracic aortic aneurysm, without rupture ([place]) 08/21/2017    Priority: High  . Major depression, recurrent ([place]) 04/04/2013    Priority: High  . Chronic pain 11/26/2012    Priority: High  . Morbid obesity ([place]) 11/26/2012    Priority: High  . History of GI bleed - PUD 1989 09/04/2012    Priority: High  . Essential hypertension 08/11/2012    Priority: High  . Lumbar post-laminectomy syndrome 05/26/2012    Priority: High  . Chronic headache  07/22/2017    Priority: Medium  . History of acute respiratory failure 07/22/2017    Priority: Medium  . Memory disorder 04/30/2015    Priority: Medium  . 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 Lilyan Punt 08/11/2012    Priority: Medium  . Chest pain 08/15/2017  . HLD (hyperlipidemia) 08/15/2017  . GERD (gastroesophageal reflux disease) 08/15/2017  . Suprapubic abdominal pain 08/15/2017  . Ascending aortic aneurysm ([place]) 08/15/2017  . Uncontrolled type 2 diabetes mellitus with hyperglycemia ([place]) 08/13/2017  . Posttraumatic stress disorder 09/04/2012  . Pain syndrome, chronic 04/22/2012   Current Meds  Medication Sig  . atorvastatin (LIPITOR) 10 MG tablet Take 1 tablet (10 mg total) by mouth daily.  . cetirizine (ZYRTEC) 10 MG tablet Take 1 tablet (10 mg total) by mouth daily.  . fluticasone (FLONASE) 50 MCG/ACT nasal spray Place 2 sprays into both nostrils daily.  Marland Kitchen ibuprofen (ADVIL,MOTRIN) 200 MG tablet Take 800 mg by mouth every 6 (six) hours as needed for moderate pain.  Marland Kitchen losartan (COZAAR) 25 MG tablet Take 1 tablet (25 mg total) by mouth daily.  . metoprolol tartrate (LOPRESSOR) 25 MG tablet Take 1 tablet (25 mg total) by mouth 2 (two) times daily.  Marland Kitchen omeprazole (PRILOSEC) 20 MG capsule Take 1 capsule (20 mg total) by mouth daily.    Allergies: Patient has No Known Allergies. Family History: Patient family history includes Asthma in his other; Depression in  his other; Diabetes in his other; Drug abuse in his daughter; Heart disease in his other; Hypertension in his other; Stroke in his father and other. Social History:  Patient  reports that he has never smoked. He has never used smokeless tobacco. He reports that he has current or past drug history. Drug: Marijuana. He reports that he does not drink alcohol.  Review of Systems: Constitutional: Negative for fever malaise or anorexia Cardiovascular: negative for chest pain Respiratory: negative for SOB or persistent  cough Gastrointestinal: negative for abdominal pain  Objective  Vitals: BP 128/82   Pulse 73   Temp 98 F (36.7 C)   Ht 6' (1.829 m)   Wt (!) 308 lb (139.7 kg)   BMI 41.77 kg/m  General: no acute distress , A&Ox3 HEENT: PEERL, conjunctiva normal, Oropharynx moist,neck is supple Cardiovascular:  RRR without murmur or gallop.  Respiratory:  Good breath sounds bilaterally, CTAB with normal respiratory effort Skin:  Warm, no rashes     Commons side effects, risks, benefits, and alternatives for medications and treatment plan prescribed today were discussed, and the patient expressed understanding of the given instructions. Patient is instructed to call or message via MyChart if he/she has any questions or concerns regarding our treatment plan. No barriers to understanding were identified. We discussed Red Flag symptoms and signs in detail. Patient expressed understanding regarding what to do in case of urgent or emergency type symptoms.   Medication list was reconciled, printed and provided to the patient in AVS. Patient instructions and summary information was reviewed with the patient as documented in the AVS. This note was prepared with assistance of Dragon voice recognition software. Occasional wrong-word or sound-a-like substitutions may have occurred due to the inherent limitations of voice recognition software

## 2017-08-21 NOTE — Patient Instructions (Signed)
Follow-up with me as scheduled   Please go to the Lab for blood work.    If you have MyChart, your results will be available to view, please respond through [facility] with questions.  We will schedule follow-up according to results.   Take Iron twice daily.

## 2017-09-07 ENCOUNTER — Ambulatory Visit: Payer: Self-pay | Admitting: Family Medicine

## 2017-09-16 ENCOUNTER — Other Ambulatory Visit: Payer: Self-pay

## 2017-09-16 ENCOUNTER — Encounter: Payer: Self-pay | Admitting: Surgery

## 2017-09-16 ENCOUNTER — Institutional Professional Consult (permissible substitution): Payer: Medicare Other | Admitting: Surgery

## 2017-09-16 VITALS — BP 162/89 | HR 68 | Resp 16 | Ht 72.0 in | Wt 302.0 lb

## 2017-09-16 DIAGNOSIS — I712 Thoracic aortic aneurysm, without rupture, unspecified: Secondary | ICD-10-CM

## 2017-09-16 NOTE — Progress Notes (Signed)
Cardiothoracic Surgery Consultation  PCP is Leamon Arnt, MD Referring Provider is Shelly Coss, MD  Chief Complaint  Patient presents with  . Thoracic Aortic Aneurysm    CTA CHEST 08/15/17, ECHO 08/28/17..may de depressed d/t death of his wife about a month ago.    HPI:  The patient is a 67 year old gentleman with a history of morbid obesity and hypertension as well as chronic mid to lower back pain who was admitted on 08/15/2017 with left chest pressure and pain.  He had never had that symptom before.  His wife died about 1 week prior to that.  He had negative cardiac enzymes and no acute EKG changes.  He had a Lexiscan stress test which was low risk.  A CTA of the chest was done which showed no pulmonary embolism.  It showed an incidental 4.9 cm fusiform ascending aortic aneurysm.  There is no family history of aneurysm or dissection.  There is no family history of connective tissue disease.  The patient was discharged the following day and has had no further episodes of chest pain or pressure.  Past Medical History:  Diagnosis Date  . Allergy   . Arthritis   . Chronic back pain   . Chronic headache 07/22/2017   "sinus" s/p sinus surgery with minimal relief.  . Depression   . GERD (gastroesophageal reflux disease)    takes Protonix daily  . GI bleeding 1989  . History of acute respiratory failure 07/22/2017   Due to unintentional OD (chronic opioids) and pna: 2011 ARF due to rhabdo  . History of blood transfusion    no abnormal reaction noted  . History of gastric ulcer    x2  . Hypertension   . Memory disorder 04/30/2015  . Obesity, Class III, BMI 40-49.9 (morbid obesity) ([place])     Past Surgical History:  Procedure Laterality Date  . ABDOMINAL FAT GRAPH  03/26/2012   Procedure: ABDOMINAL FAT GRAPH;  Surgeon: Ruby Cola, MD;  Location: [facility];  Service: ENT;  Laterality: N/A;  . ANKLE SURGERY Left   . BACK SURGERY    . ESOPHAGOGASTRODUODENOSCOPY N/A 05/27/2012   Procedure: ESOPHAGOGASTRODUODENOSCOPY (EGD);  Surgeon: Shann Medal, MD;  Location: [facility];  Service: General;  Laterality: N/A;  . ESOPHAGOGASTRODUODENOSCOPY N/A 08/10/2012   Procedure: ESOPHAGOGASTRODUODENOSCOPY (EGD);  Surgeon: Madilyn Hook, DO;  Location: [facility];  Service: General;  Laterality: N/A;  . FRACTURE SURGERY     left leg as child  . HAND SURGERY Left   . LAPAROSCOPIC GASTRIC SLEEVE RESECTION N/A 08/10/2012   Procedure: LAPAROSCOPIC GASTRIC SLEEVE RESECTION;  Surgeon: Madilyn Hook, DO;  Location: [facility];  Service: General;  Laterality: N/A;  . left testcle removed    . LEG SURGERY Bilateral   . NASAL SINUS SURGERY  03/26/2012   with fusion, x3  . SINUS ENDO W/FUSION  03/26/2012   Procedure: ENDOSCOPIC SINUS SURGERY WITH FUSION NAVIGATION;  Surgeon: Ruby Cola, MD;  Location: [facility];  Service: ENT;  Laterality: N/A;  LEFT ETHMOIDECTOMY; BILATERAL SPHENOIDECTOMY; LEFT MAXILLARY ANTROSTOMY WITH IMAGE GUIDANCE; REPAIR LEFT SPHENOID CSF LEAK; NASAL SEPTAL FLAP  . spinal cord stimulator placed    . spinal cord stimulator removed    . TIBIA FRACTURE SURGERY     multiple  . TONSILLECTOMY     as a child  . undescended testicle     as a child  . VASECTOMY      Family History  Problem Relation Age of Onset  .  Stroke Father   . Asthma Other   . Depression Other   . Diabetes Other   . Heart disease Other   . Hypertension Other   . Stroke Other   . Drug abuse Daughter   . Dementia Neg Hx     Social History Social History   Tobacco Use  . Smoking status: Never Smoker  . Smokeless tobacco: Never Used  Substance Use Topics  . Alcohol use: No    Comment: rare  . Drug use: Yes    Types: Marijuana    Comment: months  ago     Current Outpatient Medications  Medication Sig Dispense Refill  . atorvastatin (LIPITOR) 10 MG tablet Take 1 tablet (10 mg total) by mouth daily. 90 tablet 3  . ibuprofen (ADVIL,MOTRIN) 200 MG tablet Take 800 mg by mouth every 6 (six)  hours as needed for moderate pain.    . metoprolol tartrate (LOPRESSOR) 25 MG tablet Take 1 tablet (25 mg total) by mouth 2 (two) times daily. 180 tablet 3  . omeprazole (PRILOSEC) 20 MG capsule Take 1 capsule (20 mg total) by mouth daily. 90 capsule 1   No current facility-administered medications for this visit.     No Known Allergies  Review of Systems  Constitutional: Negative for activity change and fatigue.  HENT: Negative.   Eyes: Negative.   Respiratory: Negative for chest tightness and shortness of breath.   Cardiovascular: Negative for chest pain, palpitations and leg swelling.  Gastrointestinal: Negative.   Endocrine: Negative.   Genitourinary: Negative.   Musculoskeletal: Positive for back pain.  Skin: Negative.   Allergic/Immunologic: Negative.   Neurological: Negative.   Hematological: Negative.   Psychiatric/Behavioral:       Depression    BP (!) 162/89 (BP Location: Right Arm, Patient Position: Sitting, Cuff Size: Large)   Pulse 68   Resp 16   Ht 6' (1.829 m)   Wt (!) 302 lb (137 kg)   SpO2 98% Comment: ON RA  BMI 40.96 kg/m  Physical Exam  Constitutional: He is oriented to person, place, and time.  Morbidly obese gentleman in no distress  HENT:  Head: Normocephalic and atraumatic.  Mouth/Throat: Oropharynx is clear and moist.  Eyes: Pupils are equal, round, and reactive to light. Conjunctivae and EOM are normal.  Neck: Normal range of motion. Neck supple. No JVD present. No thyromegaly present.  Cardiovascular: Normal rate and regular rhythm.  Murmur heard. 1/6 systolic murmur RSB  Pulmonary/Chest: Effort normal and breath sounds normal.  Abdominal: Soft. Bowel sounds are normal. There is no tenderness.  obese  Musculoskeletal: He exhibits no edema.  Lymphadenopathy:    He has no cervical adenopathy.  Neurological: He is alert and oriented to person, place, and time.  Skin: Skin is warm and dry.  Psychiatric:  Flat affect but appropriate      Diagnostic Tests:  Result status: Final result                              *[facility]*                         [address]  [place], [place] [address]                            [phone]  ------------------------------------------------------------------- Transthoracic Echocardiography  Patient:    Serapio, Edelson MR #:       [id number] Study Date: 08/16/2017 Gender:     M Age:        39 Height:     182.9 cm Weight:     140.2 kg BSA:        2.73 m^2 Pt. Status: Room:       [address]    Ivor Costa [id number]  [id number]     SWF, UXNAT [id number]  Alvie Heidelberg [id number]  ATTENDING    Ward, [place], Inpatient  SONOGRAPHER  Chelsea Androw  cc:  ------------------------------------------------------------------- LV EF: 60% -   65%  ------------------------------------------------------------------- Indications:      Chest pain 786.51.  ------------------------------------------------------------------- History:   Risk factors:  History of acute respiratory failure Hypertension. Obese. Dyslipidemia.  ------------------------------------------------------------------- Study Conclusions  - Left ventricle: The cavity size was normal. Wall thickness was   increased in a pattern of mild LVH. Systolic function was normal.   The estimated ejection fraction was in the range of 60% to 65%.   Wall motion was normal; there were no regional wall motion   abnormalities. Doppler parameters are consistent with abnormal   left ventricular relaxation (grade 1 diastolic dysfunction). - Aortic valve: Trileaflet; mildly thickened, mildly calcified   leaflets.  ------------------------------------------------------------------- Study data:  Comparison was made to the study of 07/13/2007.  Study status:  Routine.  Procedure:  Technically difficult subcostal views.  The patient reported no pain pre or post test. Transthoracic echocardiography. Image quality was poor. The study was technically difficult, as a result of body habitus. Intravenous contrast (Definity) was administered.  Study completion:  There were no complications.          Transthoracic echocardiography.  M-mode, complete 2D, spectral Doppler, and color Doppler.  Birthdate: Patient birthdate: Dec 09, 1950.  Age:  Patient is 67 yr old.  Sex: Gender: male.    BMI: 41.9 kg/m^2.  Blood pressure:     140/75 Patient status:  Inpatient.  Study date:  Study date: 08/16/2017. Study time: 10:22 AM.  Location:  ICU/CCU  -------------------------------------------------------------------  ------------------------------------------------------------------- Left ventricle:  The cavity size was normal. Wall thickness was increased in a pattern of mild LVH. Systolic function was normal. The estimated ejection fraction was in the range of 60% to 65%. Wall motion was normal; there were no regional wall motion abnormalities. Doppler parameters are consistent with abnormal left ventricular relaxation (grade 1 diastolic dysfunction).  ------------------------------------------------------------------- Aortic valve:  Poorly visualized.  Trileaflet; mildly thickened, mildly calcified leaflets. Mobility was not restricted.  Doppler: Transvalvular velocity was within the normal range. There was no stenosis. There was no regurgitation.    VTI ratio of LVOT to aortic valve: 0.77. Valve area (VTI): 2.94 cm^2. Indexed valve area (VTI): 1.08 cm^2/m^2. Peak velocity ratio of LVOT to aortic valve: 0.69. Valve area (Vmax): 2.61 cm^2. Indexed valve area (Vmax): 0.96 cm^2/m^2. Mean velocity ratio of LVOT to aortic valve: 0.72. Valve area (Vmean): 2.75 cm^2. Indexed valve area (Vmean): 1.01 cm^2/m^2.    Mean gradient (S): 10 mm Hg. Peak gradient (S): 17 mm  Hg.  ------------------------------------------------------------------- Aorta:  Aortic root: The aortic root was normal in size.  ------------------------------------------------------------------- Mitral valve:  Structurally normal valve.   Mobility was not restricted.  Doppler:  Transvalvular velocity was within the normal range. There was no evidence for stenosis. There was no regurgitation.    Valve area by pressure half-time: 2.44 cm^2. Indexed valve area by pressure half-time: 0.89 cm^2/m^2.    Peak gradient (D): 3 mm Hg.  ------------------------------------------------------------------- Left atrium:  The atrium was at the upper limits of normal in size.   ------------------------------------------------------------------- Right ventricle:  Poorly visualized. The cavity size was normal. Wall thickness was normal. Systolic function was normal.  ------------------------------------------------------------------- Pulmonic valve:   Poorly visualized.  Structurally normal valve. Cusp separation was normal.  Doppler:  Transvalvular velocity was within the normal range. There was no evidence for stenosis. There was no regurgitation.  ------------------------------------------------------------------- Tricuspid valve:   Structurally normal valve.    Doppler: Transvalvular velocity was within the normal range. There was no regurgitation.  ------------------------------------------------------------------- Pulmonary artery:   The main pulmonary artery was normal-sized. Systolic pressure was within the normal range.  ------------------------------------------------------------------- Right atrium:  The atrium was normal in size.  ------------------------------------------------------------------- Pericardium:  There was no pericardial effusion.  ------------------------------------------------------------------- Systemic veins: Inferior vena cava: The vessel was  normal in size.  ------------------------------------------------------------------- Measurements   Left ventricle                            Value          Reference  LV ID, ED, PLAX chordal           (H)     54    mm       43 - 52  LV ID, ES, PLAX chordal                   34    mm       23 - 38  LV fx shortening, PLAX chordal            37    %        >=29  LV PW thickness, ED                       13    mm       ---------  IVS/LV PW ratio, ED                       1              <=1.3  Stroke volume, 2D                         112   ml       ---------  Stroke volume/bsa, 2D                     41    ml/m^2   ---------  LV e&', lateral                            12.6  cm/s     ---------  LV E/e&', lateral                          7.16           ---------  LV e&', medial  6.96  cm/s     ---------  LV E/e&', medial                           12.96          ---------  LV e&', average                            9.78  cm/s     ---------  LV E/e&', average                          9.22           ---------    Ventricular septum                        Value          Reference  IVS thickness, ED                         13    mm       ---------    LVOT                                      Value          Reference  LVOT ID, S                                22    mm       ---------  LVOT area                                 3.8   cm^2     ---------  LVOT peak velocity, S                     140   cm/s     ---------  LVOT mean velocity, S                     107   cm/s     ---------  LVOT VTI, S                               29.6  cm       ---------  LVOT peak gradient, S                     8     mm Hg    ---------    Aortic valve                              Value          Reference  Aortic valve peak velocity, S             204   cm/s     ---------  Aortic valve mean velocity, S             148   cm/s     ---------  Aortic  valve VTI, S                       38.3   cm       ---------  Aortic mean gradient, S                   10    mm Hg    ---------  Aortic peak gradient, S                   17    mm Hg    ---------  VTI ratio, LVOT/AV                        0.77           ---------  Aortic valve area, VTI                    2.94  cm^2     ---------  Aortic valve area/bsa, VTI                1.08  cm^2/m^2 ---------  Velocity ratio, peak, LVOT/AV             0.69           ---------  Aortic valve area, peak velocity          2.61  cm^2     ---------  Aortic valve area/bsa, peak               0.96  cm^2/m^2 ---------  velocity  Velocity ratio, mean, LVOT/AV             0.72           ---------  Aortic valve area, mean velocity          2.75  cm^2     ---------  Aortic valve area/bsa, mean               1.01  cm^2/m^2 ---------  velocity    Aorta                                     Value          Reference  Aortic root ID, ED                        29    mm       ---------    Left atrium                               Value          Reference  LA ID, A-P, ES                            38    mm       ---------  LA ID/bsa, A-P                            1.39  cm/m^2   <=2.2  LA volume, S  75.3  ml       ---------  LA volume/bsa, S                          27.6  ml/m^2   ---------  LA volume, ES, 1-p A4C                    85.8  ml       ---------  LA volume/bsa, ES, 1-p A4C                31.4  ml/m^2   ---------  LA volume, ES, 1-p A2C                    64.8  ml       ---------  LA volume/bsa, ES, 1-p A2C                23.7  ml/m^2   ---------    Mitral valve                              Value          Reference  Mitral E-wave peak velocity               90.2  cm/s     ---------  Mitral A-wave peak velocity               102   cm/s     ---------  Mitral deceleration time          (H)     306   ms       150 - 230  Mitral pressure half-time                 90    ms       ---------  Mitral peak gradient, D                    3     mm Hg    ---------  Mitral E/A ratio, peak                    0.9            ---------  Mitral valve area, PHT, DP                2.44  cm^2     ---------  Mitral valve area/bsa, PHT, DP            0.89  cm^2/m^2 ---------    Right ventricle                           Value          Reference  RV s&', lateral, S                         16.2  cm/s     ---------  Legend: (L)  and  (H)  mark values outside specified reference range.  ------------------------------------------------------------------- Prepared and Electronically Authenticated by  Candee Furbish, M.D. 2019-05-19T11:06:51    CLINICAL DATA:  Elevated D-dimer. Assess for pulmonary embolus.  EXAM: CT ANGIOGRAPHY CHEST WITH CONTRAST  TECHNIQUE: Multidetector CT imaging of the chest was performed using the standard protocol during bolus administration of intravenous contrast. Multiplanar CT  image reconstructions and MIPs were obtained to evaluate the vascular anatomy.  CONTRAST:  142mL ISOVUE-370 IOPAMIDOL (ISOVUE-370) INJECTION 76%  COMPARISON:  Chest radiograph performed earlier today at 1:28 a.m., and CT of the chest performed 12/09/2009  FINDINGS: Cardiovascular:  There is no evidence of pulmonary embolus.  There is aneurysmal dilatation of the ascending thoracic aorta to 4.9 cm in AP dimension. The aneurysmal dilatation resolves at the aortic arch. The great vessels are grossly unremarkable.  Mediastinum/Nodes: The mediastinum is unremarkable in appearance. No mediastinal lymphadenopathy is seen. The visualized portions of the thyroid gland are unremarkable. No axillary lymphadenopathy is seen.  Lungs/Pleura: Mild bilateral dependent subsegmental atelectasis is noted. No pleural effusion or pneumothorax is seen. No masses are identified.  Upper Abdomen: The visualized portions of the liver and spleen are unremarkable. The patient is status post sleeve gastrectomy.  Musculoskeletal: No  acute osseous abnormalities are identified. The visualized musculature is unremarkable in appearance.  Review of the MIP images confirms the above findings.  IMPRESSION: 1. No evidence of pulmonary embolus. 2. Aneurysmal dilatation of the ascending thoracic aorta to 4.9 cm in AP dimension. Ascending thoracic aortic aneurysm. Recommend semi-annual imaging followup by CTA or MRA and referral to cardiothoracic surgery if not already obtained. This recommendation follows 2010 ACCF/AHA/AATS/ACR/ASA/SCA/SCAI/SIR/STS/SVM Guidelines for the Diagnosis and Management of Patients With Thoracic Aortic Disease. Circulation. 2010; 121: H062-B762 3. Mild bilateral dependent subsegmental atelectasis noted; lungs otherwise clear.   Electronically Signed   By: Garald Balding M.D.   On: 08/15/2017 04:53  Impression:  This 67 year old gentleman has a 4.9 cm fusiform ascending aortic aneurysm noted incidentally on CTA of the chest.  I doubt that this was related to the symptoms of chest pressure that he presented with last month.  His descending aortic diameter is 3.4 cm but he is a large frame man.  This is still well below the 5.5 cm surgical threshold for patients with a trileaflet aortic valve.  His echocardiogram did show mild thickening and calcification of the aortic valve leaflets with a mean gradient of 10 mmHg suggesting development of early mild aortic stenosis.  This should be followed up with periodic echocardiograms.  I reviewed the CTA images with him and answered all of his questions.  I discussed the importance of good blood pressure control.  He is on Lopressor and his blood pressure was under good control when he was last seen by his PCP.  It is elevated today but he did not take his medication yet this morning.  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  The etiology  of his chest pressure and pain that prompted admission is unclear.  His cardiac work-up was negative but he is at higher risk for heart disease given his morbid obesity and hypertension as well as early untreated diabetes with recent hemoglobin A1c of 6.5-6.8.  I discussed the signs and symptoms of ischemic heart disease with him and advised him to talk with his PCP if he develops any further chest pressure or pain.   Plan:  I will plan to see him back in 6 months with a CTA of the chest to follow-up on his fusiform ascending aortic aneurysm.   I spent 30 minutes performing this consultation and > 50% of this time was spent face to face counseling and coordinating the care of this patient's fusiform ascending  aortic aneurysm.  Gaye Pollack, MD [facility] [phone]

## 2017-09-29 ENCOUNTER — Ambulatory Visit: Payer: Medicare Other | Admitting: Neurology

## 2017-09-29 ENCOUNTER — Encounter: Payer: Self-pay | Admitting: Neurology

## 2017-09-29 VITALS — BP 141/88 | HR 60 | Ht 72.0 in | Wt 315.5 lb

## 2017-09-29 DIAGNOSIS — R51 Headache: Secondary | ICD-10-CM

## 2017-09-29 DIAGNOSIS — G8929 Other chronic pain: Secondary | ICD-10-CM

## 2017-09-29 MED ORDER — VENLAFAXINE HCL ER 37.5 MG PO CP24: ORAL_CAPSULE | ORAL | 2 refills | Status: AC

## 2017-09-29 NOTE — Progress Notes (Signed)
Reason for visit: Headache  Referring physician: Dr. Julio Alm Randy Grimes is a 67 y.o. male  History of present illness:  Randy Grimes is a 66 year old right-handed white male with a history of obesity and chronic low back pain.  Randy Grimes was seen through this office in 2017 with a reported memory disturbance.  Formal neuropsychological evaluation did not show true dementia, Randy Grimes was felt to have symptoms of ADD.  Randy Grimes was lost to follow-up.  He returns at this point for evaluation of chronic daily headaches.  He claims that his headaches began several decades ago, he does not remember Randy last day without a headache.  Randy headaches are usually in Randy front of Randy head or on Randy temporal areas associated with a squeezing sensation as if his head is in a vice.  He denies any significant neck discomfort.  He denies photophobia or phonophobia or nausea or vomiting with Randy headache.  Randy Grimes will take ibuprofen with good benefit, he takes ibuprofen daily.  He drinks sweet tea during Randy day, he does not use any other caffeinated products.  Randy Grimes reports no numbness or weakness of Randy face, arms, legs.  He denies any balance issues or difficulty controlling Randy bowels or Randy bladder.  He is quite obese, but he denies any problems with fatigue or excessive daytime drowsiness, he sleeps 6 to 7 hours at night.  Randy Grimes indicates that his wife died 52 month ago.  His headaches of course predate this by several decades.  He continues to report some troubles with memory.  Past Medical History:  Diagnosis Date  . Allergy   . Arthritis   . Chronic back pain   . Chronic headache 07/22/2017   "sinus" s/p sinus surgery with minimal relief.  . Depression   . GERD (gastroesophageal reflux disease)    takes Protonix daily  . GI bleeding 1989  . History of acute respiratory failure 07/22/2017   Due to unintentional OD (chronic opioids) and pna: 2011 ARF due to rhabdo  . History  of blood transfusion    no abnormal reaction noted  . History of gastric ulcer    x2  . Hypertension   . Memory disorder 04/30/2015  . Obesity, Class III, BMI 40-49.9 (morbid obesity) ([place])     Past Surgical History:  Procedure Laterality Date  . ABDOMINAL FAT GRAPH  03/26/2012   Procedure: ABDOMINAL FAT GRAPH;  Surgeon: Ruby Cola, MD;  Location: [facility];  Service: ENT;  Laterality: N/A;  . ANKLE SURGERY Left   . BACK SURGERY    . ESOPHAGOGASTRODUODENOSCOPY N/A 05/27/2012   Procedure: ESOPHAGOGASTRODUODENOSCOPY (EGD);  Surgeon: Shann Medal, MD;  Location: [facility];  Service: General;  Laterality: N/A;  . ESOPHAGOGASTRODUODENOSCOPY N/A 08/10/2012   Procedure: ESOPHAGOGASTRODUODENOSCOPY (EGD);  Surgeon: Madilyn Hook, DO;  Location: [facility];  Service: General;  Laterality: N/A;  . FRACTURE SURGERY     left leg as child  . HAND SURGERY Left   . LAPAROSCOPIC GASTRIC SLEEVE RESECTION N/A 08/10/2012   Procedure: LAPAROSCOPIC GASTRIC SLEEVE RESECTION;  Surgeon: Madilyn Hook, DO;  Location: [facility];  Service: General;  Laterality: N/A;  . left testcle removed    . LEG SURGERY Bilateral   . NASAL SINUS SURGERY  03/26/2012   with fusion, x3  . SINUS ENDO W/FUSION  03/26/2012   Procedure: ENDOSCOPIC SINUS SURGERY WITH FUSION NAVIGATION;  Surgeon: Ruby Cola, MD;  Location: [facility];  Service: ENT;  Laterality: N/A;  LEFT ETHMOIDECTOMY; BILATERAL SPHENOIDECTOMY; LEFT MAXILLARY ANTROSTOMY WITH IMAGE GUIDANCE; REPAIR LEFT SPHENOID CSF LEAK; NASAL SEPTAL FLAP  . spinal cord stimulator placed    . spinal cord stimulator removed    . TIBIA FRACTURE SURGERY     multiple  . TONSILLECTOMY     as a child  . undescended testicle     as a child  . VASECTOMY      Family History  Problem Relation Age of Onset  . Stroke Father   . Asthma Other   . Depression Other   . Diabetes Other   . Heart disease Other   . Hypertension Other   . Stroke Other   . Drug abuse Daughter   . Dementia Neg Hx      Social history:  reports that he has never smoked. He has never used smokeless tobacco. He reports that he has current or past drug history. Drug: Marijuana. He reports that he does not drink alcohol.  Medications:  Prior to Admission medications   Medication Sig Start Date End Date Taking? Authorizing Provider  atorvastatin (LIPITOR) 10 MG tablet Take 1 tablet (10 mg total) by mouth daily. 08/13/17  Yes Leamon Arnt, MD  ibuprofen (ADVIL,MOTRIN) 200 MG tablet Take 800 mg by mouth every 6 (six) hours as needed for moderate pain.   Yes [provider]  metoprolol tartrate (LOPRESSOR) 25 MG tablet Take 1 tablet (25 mg total) by mouth 2 (two) times daily. 07/22/17  Yes Leamon Arnt, MD  omeprazole (PRILOSEC) 20 MG capsule Take 1 capsule (20 mg total) by mouth daily. 07/22/17  Yes Leamon Arnt, MD  venlafaxine XR (EFFEXOR XR) 37.5 MG 24 hr capsule Take one capsule a day for one week, then take 2 capsules a day for one week, then take 3 capsules daily 09/29/17   Kathrynn Ducking, MD     No Known Allergies  ROS:  Out of a complete 14 system review of symptoms, Randy Grimes complains only of Randy following symptoms, and all other reviewed systems are negative.  Aching muscles  Blood pressure (!) 141/88, pulse 60, height 6' (1.829 m), weight (!) 315 lb 8 oz (143.1 kg).  Physical Exam  General: Randy Grimes is alert and cooperative at Randy time of Randy examination.  Randy Grimes is markedly obese.  Eyes: Pupils are equal, round, and reactive to light. Discs are flat bilaterally.  Neck: Randy neck is supple, no carotid bruits are noted.  Respiratory: Randy respiratory examination is clear.  Cardiovascular: Randy cardiovascular examination reveals a regular rate and rhythm, no obvious murmurs or rubs are noted.  Skin: Extremities are with 3+ edema below Randy knees bilaterally.  Neurologic Exam  Mental status: Randy Grimes is alert and oriented x 3 at Randy time of Randy examination. Randy  Grimes has apparent normal recent and remote memory, with an apparently normal attention span and concentration ability.  Cranial nerves: Facial symmetry is present. There is good sensation of Randy face to pinprick and soft touch bilaterally. Randy strength of Randy facial muscles and Randy muscles to head turning and shoulder shrug are normal bilaterally. Speech is well enunciated, no aphasia or dysarthria is noted. Extraocular movements are full. Visual fields are full. Randy tongue is midline, and Randy Grimes has symmetric elevation of Randy soft palate. No obvious hearing deficits are noted.  Motor: Randy motor testing reveals 5 over 5 strength of all 4 extremities. Good symmetric motor tone is noted throughout.  Sensory: Sensory testing is intact to pinprick, soft touch, vibration sensation, and position sense on all 4 extremities, with exception of some decrease in pinprick sensation of Randy left forearm and decreased vibration sensation in Randy left foot. No evidence of extinction is noted.  Coordination: Cerebellar testing reveals good finger-nose-finger and heel-to-shin bilaterally.  Gait and station: Gait is normal. Tandem gait is slightly unsteady. Romberg is negative. No drift is seen.  Reflexes: Deep tendon reflexes are symmetric, but are depressed bilaterally. Toes are downgoing bilaterally.   Assessment/Plan:  1.  Chronic daily headache, likely tension headache  2.  Reports of memory disturbance  Randy Grimes will be placed on Effexor, Randy dose will be gradually increased over time.  He will follow-up in 4 months.  Randy headaches have been present for several decades, it is unlikely that Randy headaches will be completely eliminated but hopefully we can reduce Randy severity of Randy headache.  Randy Grimes will call for any dose adjustments or any side effects he may have on Randy medication.  Randy use of ibuprofen is probably Randy best choice as this is Randy medication that is least likely to cause rebound  headache.  I have asked him to cut back on some of his caffeine intake.  Jill Alexanders MD 09/29/2017 9:58 AM  [facility] [address] [place], [place] [address]  Phone [phone] Fax [phone]

## 2017-10-05 ENCOUNTER — Ambulatory Visit: Payer: Medicare Other | Admitting: Family Medicine

## 2017-10-06 ENCOUNTER — Ambulatory Visit: Payer: Medicare Other | Admitting: Family Medicine

## 2017-10-28 ENCOUNTER — Encounter: Payer: Self-pay | Admitting: Family Medicine

## 2017-10-28 ENCOUNTER — Ambulatory Visit (INDEPENDENT_AMBULATORY_CARE_PROVIDER_SITE_OTHER): Payer: Medicare Other | Admitting: Family Medicine

## 2017-10-28 ENCOUNTER — Other Ambulatory Visit: Payer: Self-pay

## 2017-10-28 VITALS — BP 142/102 | HR 55 | Temp 98.5°F | Ht 72.0 in | Wt 319.2 lb

## 2017-10-28 DIAGNOSIS — E119 Type 2 diabetes mellitus without complications: Secondary | ICD-10-CM | POA: Diagnosis not present

## 2017-10-28 DIAGNOSIS — M549 Dorsalgia, unspecified: Secondary | ICD-10-CM

## 2017-10-28 NOTE — Progress Notes (Signed)
Subjective  CC:  Chief Complaint  Patient presents with  . Back Pain    Low Back Pain x months, but states pain is interfering with sleep 7/10 on pain scale     HPI: Randy Grimes is a 67 y.o. male who presents to the office today to address the problems listed above in the chief complaint.  See ov 06/2017: has 7-8 month h/o right lateral/mid thoracic back pain. Worked up for flank pain in April:nl urine, renal function and xrays. Pt reports constant dull pain now worsening and interfering with sleep. Has h/o chronic low back pain due to traumatic injury; failed pain clinics. Lives with pain. But this pain is in a different location. Nothing helps or changes the pain.   DM: new onset with a1c 6.5 last visit. Denies foot sxs.    Assessment  1. Mid back pain on left side   2. New onset type 2 diabetes mellitus ([place])      Plan   Back pain:  Pt says nothing helps; has failed pain meds, mm relaxers, and reportedly "lots of other meds". Will refer to ortho for assistance.   DM- discussed diabetic diet to control diabetes. See avs.   Follow up: Return in about 6 weeks (around 12/09/2017) for follow up Diabetes.   Orders Placed This Encounter  Procedures  . Ambulatory referral to Orthopedic Surgery   No orders of the defined types were placed in this encounter.     I reviewed the patients updated PMH, FH, and SocHx.    Patient Active Problem List   Diagnosis Date Noted  . Thoracic aortic aneurysm, without rupture ([place]) 08/21/2017    Priority: High  . Major depression, recurrent ([place]) 04/04/2013    Priority: High  . Chronic pain 11/26/2012    Priority: High  . Morbid obesity ([place]) 11/26/2012    Priority: High  . History of GI bleed - PUD 1989 09/04/2012    Priority: High  . Essential hypertension 08/11/2012    Priority: High  . Lumbar post-laminectomy syndrome 05/26/2012    Priority: High  . Chronic headache 07/22/2017    Priority: Medium  . History of acute  respiratory failure 07/22/2017    Priority: Medium  . Memory disorder 04/30/2015    Priority: Medium  . 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 Lilyan Punt 08/11/2012    Priority: Medium  . New onset type 2 diabetes mellitus ([place]) 10/28/2017  . Chest pain 08/15/2017  . HLD (hyperlipidemia) 08/15/2017  . GERD (gastroesophageal reflux disease) 08/15/2017  . Suprapubic abdominal pain 08/15/2017  . Ascending aortic aneurysm ([place]) 08/15/2017  . Uncontrolled type 2 diabetes mellitus with hyperglycemia ([place]) 08/13/2017  . Posttraumatic stress disorder 09/04/2012  . Pain syndrome, chronic 04/22/2012   Current Meds  Medication Sig  . atorvastatin (LIPITOR) 10 MG tablet Take 1 tablet (10 mg total) by mouth daily.  Marland Kitchen ibuprofen (ADVIL,MOTRIN) 200 MG tablet Take 800 mg by mouth every 6 (six) hours as needed for moderate pain.  . metoprolol tartrate (LOPRESSOR) 25 MG tablet Take 1 tablet (25 mg total) by mouth 2 (two) times daily.  Marland Kitchen omeprazole (PRILOSEC) 20 MG capsule Take 1 capsule (20 mg total) by mouth daily.  Marland Kitchen venlafaxine XR (EFFEXOR XR) 37.5 MG 24 hr capsule Take one capsule a day for one week, then take 2 capsules a day for one week, then take 3 capsules daily    Allergies: Patient has No Known Allergies. Family History: Patient family history includes Asthma  in his other; Depression in his other; Diabetes in his other; Drug abuse in his daughter; Heart disease in his other; Hypertension in his other; Stroke in his father and other. Social History:  Patient  reports that he has never smoked. He has never used smokeless tobacco. He reports that he has current or past drug history. Drug: Marijuana. He reports that he does not drink alcohol.  Review of Systems: Constitutional: Negative for fever malaise or anorexia Cardiovascular: negative for chest pain Respiratory: negative for SOB or persistent cough Gastrointestinal: negative for abdominal pain  Objective  Vitals: BP (!) 142/102    Pulse (!) 55   Temp 98.5 F (36.9 C)   Ht 6' (1.829 m)   Wt (!) 319 lb 3.2 oz (144.8 kg)   SpO2 99%   BMI 43.29 kg/m  General: no acute distress , A&Ox3, flat affect Moves well Right paravertebral thoracic area: non-tender. No spinal ttp.    Commons side effects, risks, benefits, and alternatives for medications and treatment plan prescribed today were discussed, and the patient expressed understanding of the given instructions. Patient is instructed to call or message via MyChart if he/she has any questions or concerns regarding our treatment plan. No barriers to understanding were identified. We discussed Red Flag symptoms and signs in detail. Patient expressed understanding regarding what to do in case of urgent or emergency type symptoms.   Medication list was reconciled, printed and provided to the patient in AVS. Patient instructions and summary information was reviewed with the patient as documented in the AVS. This note was prepared with assistance of Dragon voice recognition software. Occasional wrong-word or sound-a-like substitutions may have occurred due to the inherent limitations of voice recognition software

## 2017-10-28 NOTE — Patient Instructions (Signed)
Please return in September for diabetes check.   If you have any questions or concerns, please don't hesitate to send me a message via MyChart or call the office at [phone]. Thank you for visiting with [place] today! It's our pleasure caring for you.  We will call you with information regarding your referral appointment. Orthopedic for your back pain. If you do not hear from [place] within the next 2 weeks, please let me know. It can take 1-2 weeks to get appointments set up with the specialists.    Diabetes Mellitus and Nutrition When you have diabetes (diabetes mellitus), it is very important to have healthy eating habits because your blood sugar (glucose) levels are greatly affected by what you eat and drink. Eating healthy foods in the appropriate amounts, at about the same times every day, can help you:  Control your blood glucose.  Lower your risk of heart disease.  Improve your blood pressure.  Reach or maintain a healthy weight.  Every person with diabetes is different, and each person has different needs for a meal plan. Your health care provider may recommend that you work with a diet and nutrition specialist (dietitian) to make a meal plan that is best for you. Your meal plan may vary depending on factors such as:  The calories you need.  The medicines you take.  Your weight.  Your blood glucose, blood pressure, and cholesterol levels.  Your activity level.  Other health conditions you have, such as heart or kidney disease.  How do carbohydrates affect me? Carbohydrates affect your blood glucose level more than any other type of food. Eating carbohydrates naturally increases the amount of glucose in your blood. Carbohydrate counting is a method for keeping track of how many carbohydrates you eat. Counting carbohydrates is important to keep your blood glucose at a healthy level, especially if you use insulin or take certain oral diabetes medicines. It is important to know how  many carbohydrates you can safely have in each meal. This is different for every person. Your dietitian can help you calculate how many carbohydrates you should have at each meal and for snack. Foods that contain carbohydrates include:  Bread, cereal, rice, pasta, and crackers.  Potatoes and corn.  Peas, beans, and lentils.  Milk and yogurt.  Fruit and juice.  Desserts, such as cakes, cookies, ice cream, and candy.  How does alcohol affect me? Alcohol can cause a sudden decrease in blood glucose (hypoglycemia), especially if you use insulin or take certain oral diabetes medicines. Hypoglycemia can be a life-threatening condition. Symptoms of hypoglycemia (sleepiness, dizziness, and confusion) are similar to symptoms of having too much alcohol. If your health care provider says that alcohol is safe for you, follow these guidelines:  Limit alcohol intake to no more than 1 drink per day for nonpregnant women and 2 drinks per day for men. One drink equals 12 oz of beer, 5 oz of wine, or 1 oz of hard liquor.  Do not drink on an empty stomach.  Keep yourself hydrated with water, diet soda, or unsweetened iced tea.  Keep in mind that regular soda, juice, and other mixers may contain a lot of sugar and must be counted as carbohydrates.  What are tips for following this plan? Reading food labels  Start by checking the serving size on the label. The amount of calories, carbohydrates, fats, and other nutrients listed on the label are based on one serving of the food. Many foods contain more than one  serving per package.  Check the total grams (g) of carbohydrates in one serving. You can calculate the number of servings of carbohydrates in one serving by dividing the total carbohydrates by 15. For example, if a food has 30 g of total carbohydrates, it would be equal to 2 servings of carbohydrates.  Check the number of grams (g) of saturated and trans fats in one serving. Choose foods that have  low or no amount of these fats.  Check the number of milligrams (mg) of sodium in one serving. Most people should limit total sodium intake to less than 2,300 mg per day.  Always check the nutrition information of foods labeled as "low-fat" or "nonfat". These foods may be higher in added sugar or refined carbohydrates and should be avoided.  Talk to your dietitian to identify your daily goals for nutrients listed on the label. Shopping  Avoid buying canned, premade, or processed foods. These foods tend to be high in fat, sodium, and added sugar.  Shop around the outside edge of the grocery store. This includes fresh fruits and vegetables, bulk grains, fresh meats, and fresh dairy. Cooking  Use low-heat cooking methods, such as baking, instead of high-heat cooking methods like deep frying.  Cook using healthy oils, such as olive, canola, or sunflower oil.  Avoid cooking with butter, cream, or high-fat meats. Meal planning  Eat meals and snacks regularly, preferably at the same times every day. Avoid going long periods of time without eating.  Eat foods high in fiber, such as fresh fruits, vegetables, beans, and whole grains. Talk to your dietitian about how many servings of carbohydrates you can eat at each meal.  Eat 4-6 ounces of lean protein each day, such as lean meat, chicken, fish, eggs, or tofu. 1 ounce is equal to 1 ounce of meat, chicken, or fish, 1 egg, or 1/4 cup of tofu.  Eat some foods each day that contain healthy fats, such as avocado, nuts, seeds, and fish. Lifestyle   Check your blood glucose regularly.  Exercise at least 30 minutes 5 or more days each week, or as told by your health care provider.  Take medicines as told by your health care provider.  Do not use any products that contain nicotine or tobacco, such as cigarettes and e-cigarettes. If you need help quitting, ask your health care provider.  Work with a Social worker or diabetes educator to identify  strategies to manage stress and any emotional and social challenges. What are some questions to ask my health care provider?  Do I need to meet with a diabetes educator?  Do I need to meet with a dietitian?  What number can I call if I have questions?  When are the best times to check my blood glucose? Where to find more information:  American Diabetes Association: diabetes.org/food-and-fitness/food  Academy of Nutrition and Dietetics: PokerClues.dk  [employer] of Diabetes and Digestive and Kidney Diseases (NIH): ContactWire.be Summary  A healthy meal plan will help you control your blood glucose and maintain a healthy lifestyle.  Working with a diet and nutrition specialist (dietitian) can help you make a meal plan that is best for you.  Keep in mind that carbohydrates and alcohol have immediate effects on your blood glucose levels. It is important to count carbohydrates and to use alcohol carefully. This information is not intended to replace advice given to you by your health care provider. Make sure you discuss any questions you have with your health care provider. Document  Released: 12/12/2004 Document Revised: 04/21/2016 Document Reviewed: 04/21/2016 Elsevier Interactive Patient Education  [employer].

## 2017-11-06 DIAGNOSIS — M546 Pain in thoracic spine: Secondary | ICD-10-CM | POA: Diagnosis not present

## 2017-11-09 ENCOUNTER — Other Ambulatory Visit: Payer: Self-pay | Admitting: Orthopedic Surgery

## 2017-11-09 DIAGNOSIS — M546 Pain in thoracic spine: Secondary | ICD-10-CM

## 2017-11-21 ENCOUNTER — Ambulatory Visit
Admission: RE | Admit: 2017-11-21 | Discharge: 2017-11-21 | Disposition: A | Discharge status: Home / Self Care | Payer: Medicare Other | Source: Ambulatory Visit | Attending: Orthopedic Surgery | Admitting: Orthopedic Surgery

## 2017-11-21 DIAGNOSIS — M546 Pain in thoracic spine: Secondary | ICD-10-CM | POA: Insufficient documentation

## 2017-11-21 DIAGNOSIS — D1809 Hemangioma of other sites: Secondary | ICD-10-CM | POA: Insufficient documentation

## 2017-11-24 DIAGNOSIS — M546 Pain in thoracic spine: Secondary | ICD-10-CM | POA: Diagnosis not present

## 2017-12-10 ENCOUNTER — Ambulatory Visit: Payer: Self-pay | Admitting: Family Medicine

## 2018-01-29 NOTE — Progress Notes (Deleted)
[facility]  PATIENT: Randy Grimes DOB: [birth year]   REASON FOR VISIT: *** HISTORY FROM:    HISTORY OF PRESENT ILLNESS: 09/29/17 KWMr. Kocurek is a 67 year old right-handed white male with a history of obesity and chronic low back pain.  The patient was seen through this office in 2017 with a reported memory disturbance.  Formal neuropsychological evaluation did not show true dementia, the patient was felt to have symptoms of ADD.  The patient was lost to follow-up.  He returns at this point for evaluation of chronic daily headaches.  He claims that his headaches began several decades ago, he does not remember the last day without a headache.  The headaches are usually in the front of the head or on the temporal areas associated with a squeezing sensation as if his head is in a vice.  He denies any significant neck discomfort.  He denies photophobia or phonophobia or nausea or vomiting with the headache.  The patient will take ibuprofen with good benefit, he takes ibuprofen daily.  He drinks sweet tea during the day, he does not use any other caffeinated products.  The patient reports no numbness or weakness of the face, arms, legs.  He denies any balance issues or difficulty controlling the bowels or the bladder.  He is quite obese, but he denies any problems with fatigue or excessive daytime drowsiness, he sleeps 6 to 7 hours at night.  The patient indicates that his wife died 71 month ago.  His headaches of course predate this by several decades.  He continues to report some troubles with memory.   REVIEW OF SYSTEMS: Full 14 system review of systems performed and notable only for those listed, all others are neg:  Constitutional: neg  Cardiovascular: neg Ear/Nose/Throat: neg  Skin: neg Eyes: neg Respiratory: neg Gastroitestinal: neg  Hematology/Lymphatic: neg  Endocrine: neg Musculoskeletal:neg Allergy/Immunology: neg Neurological: neg Psychiatric: neg Sleep :  neg   ALLERGIES: No Known Allergies  HOME MEDICATIONS: Outpatient Medications Prior to Visit  Medication Sig Dispense Refill  . atorvastatin (LIPITOR) 10 MG tablet Take 1 tablet (10 mg total) by mouth daily. 90 tablet 3  . ibuprofen (ADVIL,MOTRIN) 200 MG tablet Take 800 mg by mouth every 6 (six) hours as needed for moderate pain.    . metoprolol tartrate (LOPRESSOR) 25 MG tablet Take 1 tablet (25 mg total) by mouth 2 (two) times daily. 180 tablet 3  . omeprazole (PRILOSEC) 20 MG capsule Take 1 capsule (20 mg total) by mouth daily. 90 capsule 1  . venlafaxine XR (EFFEXOR XR) 37.5 MG 24 hr capsule Take one capsule a day for one week, then take 2 capsules a day for one week, then take 3 capsules daily 90 capsule 2   No facility-administered medications prior to visit.     PAST MEDICAL HISTORY: Past Medical History:  Diagnosis Date  . Allergy   . Arthritis   . Chronic back pain   . Chronic headache 07/22/2017   "sinus" s/p sinus surgery with minimal relief.  . Depression   . GERD (gastroesophageal reflux disease)    takes Protonix daily  . GI bleeding 1989  . History of acute respiratory failure 07/22/2017   Due to unintentional OD (chronic opioids) and pna: 2011 ARF due to rhabdo  . History of blood transfusion    no abnormal reaction noted  . History of gastric ulcer    x2  . Hypertension   . Memory disorder 04/30/2015  . Obesity, Class  III, BMI 40-49.9 (morbid obesity) ([place])     PAST SURGICAL HISTORY: Past Surgical History:  Procedure Laterality Date  . ABDOMINAL FAT GRAPH  03/26/2012   Procedure: ABDOMINAL FAT GRAPH;  Surgeon: Ruby Cola, MD;  Location: [facility];  Service: ENT;  Laterality: N/A;  . ANKLE SURGERY Left   . BACK SURGERY    . ESOPHAGOGASTRODUODENOSCOPY N/A 05/27/2012   Procedure: ESOPHAGOGASTRODUODENOSCOPY (EGD);  Surgeon: Shann Medal, MD;  Location: [facility];  Service: General;  Laterality: N/A;  . ESOPHAGOGASTRODUODENOSCOPY N/A 08/10/2012    Procedure: ESOPHAGOGASTRODUODENOSCOPY (EGD);  Surgeon: Madilyn Hook, DO;  Location: [facility];  Service: General;  Laterality: N/A;  . FRACTURE SURGERY     left leg as child  . HAND SURGERY Left   . LAPAROSCOPIC GASTRIC SLEEVE RESECTION N/A 08/10/2012   Procedure: LAPAROSCOPIC GASTRIC SLEEVE RESECTION;  Surgeon: Madilyn Hook, DO;  Location: [facility];  Service: General;  Laterality: N/A;  . left testcle removed    . LEG SURGERY Bilateral   . NASAL SINUS SURGERY  03/26/2012   with fusion, x3  . SINUS ENDO W/FUSION  03/26/2012   Procedure: ENDOSCOPIC SINUS SURGERY WITH FUSION NAVIGATION;  Surgeon: Ruby Cola, MD;  Location: [facility];  Service: ENT;  Laterality: N/A;  LEFT ETHMOIDECTOMY; BILATERAL SPHENOIDECTOMY; LEFT MAXILLARY ANTROSTOMY WITH IMAGE GUIDANCE; REPAIR LEFT SPHENOID CSF LEAK; NASAL SEPTAL FLAP  . spinal cord stimulator placed    . spinal cord stimulator removed    . TIBIA FRACTURE SURGERY     multiple  . TONSILLECTOMY     as a child  . undescended testicle     as a child  . VASECTOMY      FAMILY HISTORY: Family History  Problem Relation Age of Onset  . Stroke Father   . Asthma Other   . Depression Other   . Diabetes Other   . Heart disease Other   . Hypertension Other   . Stroke Other   . Drug abuse Daughter   . Dementia Neg Hx     SOCIAL HISTORY: Social History   Socioeconomic History  . Marital status: Widowed    Spouse name: Cecille Rubin  . Number of children: 5  . Years of education: 74  . Highest education level: Not on file  Occupational History  . Occupation: disabled  Social Needs  . Financial resource strain: Somewhat hard  . Food insecurity:    Worry: Not on file    Inability: Not on file  . Transportation needs:    Medical: Not on file    Non-medical: Not on file  Tobacco Use  . Smoking status: Never Smoker  . Smokeless tobacco: Never Used  Substance and Sexual Activity  . Alcohol use: No    Comment: rare  . Drug use: Yes    Types: Marijuana     Comment: months  ago   . Sexual activity: Not Currently  Lifestyle  . Physical activity:    Days per week: Not on file    Minutes per session: Not on file  . Stress: Not on file  Relationships  . Social connections:    Talks on phone: Not on file    Gets together: Not on file    Attends religious service: Not on file    Active member of club or organization: Not on file    Attends meetings of clubs or organizations: Not on file    Relationship status: Not on file  . Intimate partner violence:  Fear of current or ex partner: Not on file    Emotionally abused: Not on file    Physically abused: Not on file    Forced sexual activity: Not on file  Other Topics Concern  . Not on file  Social History Narrative   Has been on disability since 2006   Never smoker or drink   Curtrently lives in [place] with wife   Patient drinks caffeine occasionally.   Patient is right handed.      PHYSICAL EXAM  There were no vitals filed for this visit. There is no height or weight on file to calculate BMI.  Generalized: Well developed, in no acute distress  Head: normocephalic and atraumatic,. Oropharynx benign  Neck: Supple, no carotid bruits  Cardiac: Regular rate rhythm, no murmur  Musculoskeletal: No deformity   Neurological examination   Mentation: Alert oriented to time, place, history taking. Attention span and concentration appropriate. Recent and remote memory intact.  Follows all commands speech and language fluent.   Cranial nerve II-XII: Fundoscopic exam reveals sharp disc margins.Pupils were equal round reactive to light extraocular movements were full, visual field were full on confrontational test. Facial sensation and strength were normal. hearing was intact to finger rubbing bilaterally. Uvula tongue midline. head turning and shoulder shrug were normal and symmetric.Tongue protrusion into cheek strength was normal. Motor: normal bulk and tone, full strength in the BUE,  BLE, fine finger movements normal, no pronator drift. No focal weakness Sensory: normal and symmetric to light touch, pinprick, and  Vibration, proprioception  Coordination: finger-nose-finger, heel-to-shin bilaterally, no dysmetria Reflexes: Brachioradialis 2/2, biceps 2/2, triceps 2/2, patellar 2/2, Achilles 2/2, plantar responses were flexor bilaterally. Gait and Station: Rising up from seated position without assistance, normal stance,  moderate stride, good arm swing, smooth turning, able to perform tiptoe, and heel walking without difficulty. Tandem gait is steady  DIAGNOSTIC DATA (LABS, IMAGING, TESTING) - I reviewed patient records, labs, notes, testing and imaging myself where available.  Lab Results  Component Value Date   WBC 4.7 08/15/2017   HGB 11.2 (L) 08/15/2017   HCT 37.3 (L) 08/15/2017   MCV 79.9 08/15/2017   PLT 222 08/15/2017      Component Value Date/Time   NA 139 08/21/2017 1115   NA 140 05/23/2014   K 4.4 08/21/2017 1115   CL 103 08/21/2017 1115   CO2 28 08/21/2017 1115   GLUCOSE 129 (H) 08/21/2017 1115   BUN 11 08/21/2017 1115   BUN 10 05/23/2014   CREATININE 0.79 08/21/2017 1115   CREATININE 1.16 11/26/2012 1326   CALCIUM 8.5 08/21/2017 1115   PROT 6.2 07/22/2017 1035   ALBUMIN 3.5 07/22/2017 1035   AST 12 07/22/2017 1035   ALT 15 07/22/2017 1035   ALKPHOS 68 07/22/2017 1035   BILITOT 0.5 07/22/2017 1035   GFRNONAA >60 08/16/2017 0253   GFRAA >60 08/16/2017 0253   Lab Results  Component Value Date   CHOL 194 08/15/2017   HDL 48 08/15/2017   LDLCALC 134 (H) 08/15/2017   TRIG 58 08/15/2017   CHOLHDL 4.0 08/15/2017   Lab Results  Component Value Date   HGBA1C 6.5 (H) 08/15/2017   Lab Results  Component Value Date   VITAMINB12 439 04/30/2015   Lab Results  Component Value Date   TSH 2.04 07/22/2017    ***  ASSESSMENT AND PLAN  67 y.o. year old male  has a past medical history of Allergy, Arthritis, Chronic back pain, Chronic headache  (07/22/2017),  Depression, GERD (gastroesophageal reflux disease), GI bleeding (1989), History of acute respiratory failure (07/22/2017), History of blood transfusion, History of gastric ulcer, Hypertension, Memory disorder (04/30/2015), and Obesity, Class III, BMI 40-49.9 (morbid obesity) ([place]). here with ***  Chronic daily headache, likely tension headache  2.  Reports of memory disturbance  The patient will be placed on Effexor, the dose will be gradually increased over time.  He will follow-up in 4 months.  The headaches have been present for several decades, it is unlikely that the headaches will be completely eliminated but hopefully we can reduce the severity of the headache.  The patient will call for any dose adjustments or any side effects he may have on the medication.  The use of ibuprofen is probably the best choice as this is the medication that is least likely to cause rebound headache.  I have asked him to cut back on some of his caffeine intake.  Dennie Bible, [facility], [facility], APRN  [facility] [address] [place], [place] [address] [phone]

## 2018-02-01 ENCOUNTER — Ambulatory Visit: Payer: Medicare Other | Admitting: Nurse Practitioner

## 2018-02-08 ENCOUNTER — Other Ambulatory Visit: Payer: Self-pay | Admitting: Surgery

## 2018-02-08 DIAGNOSIS — I712 Thoracic aortic aneurysm, without rupture, unspecified: Secondary | ICD-10-CM

## 2018-04-07 ENCOUNTER — Other Ambulatory Visit: Payer: Self-pay

## 2018-04-07 ENCOUNTER — Ambulatory Visit: Payer: Self-pay | Admitting: Surgery

## 2018-12-05 IMAGING — CT CT ANGIO CHEST
3 of 7 series · 18 of 36 positions shown · IV contrast (APPLIED)
Comparison: Chest radiograph performed earlier today at [DATE] a.m.,
and CT of the chest performed 12/09/2009

CLINICAL DATA: Elevated D-dimer. Assess for pulmonary embolus.

EXAM:
CT ANGIOGRAPHY CHEST WITH CONTRAST
TECHNIQUE: Multidetector CT imaging of the chest was performed using the
standard protocol during bolus administration of intravenous
contrast. Multiplanar CT image reconstructions and MIPs were
obtained to evaluate the vascular anatomy.
CONTRAST:  100mL [id number] IOPAMIDOL ([id number]) INJECTION 76%

[Series 6: lung · axial · 0.78mm/px · z∈[+1272,+1376]mm · 3 of 130 slices shown]
[im 26/130  mediastinal]
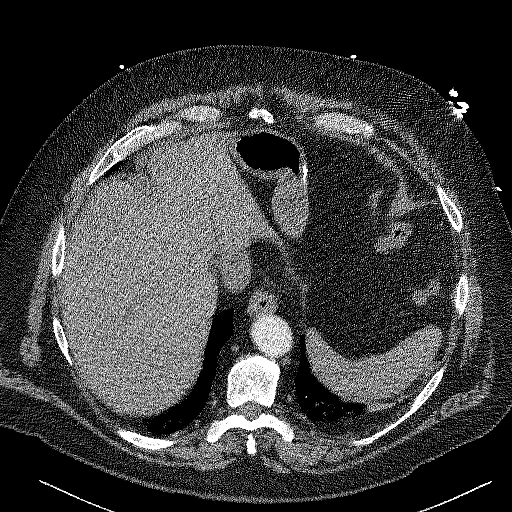
[im 52/130  mediastinal]
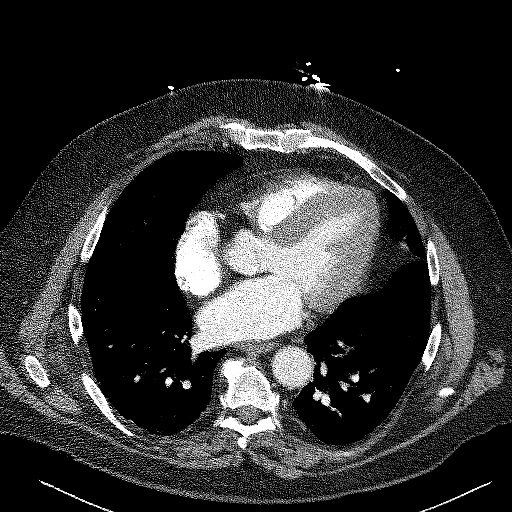
[im 78/130  mediastinal]
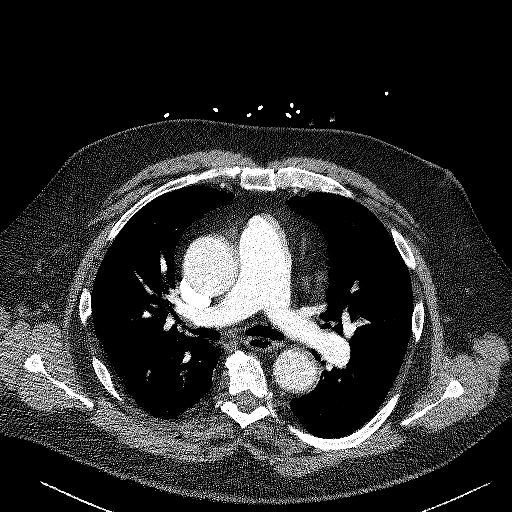

[Series 7: thins · axial · 0.78mm/px · z∈[+1238,+1463]mm · 14 of 371 slices shown]
[im 25/371  lung]
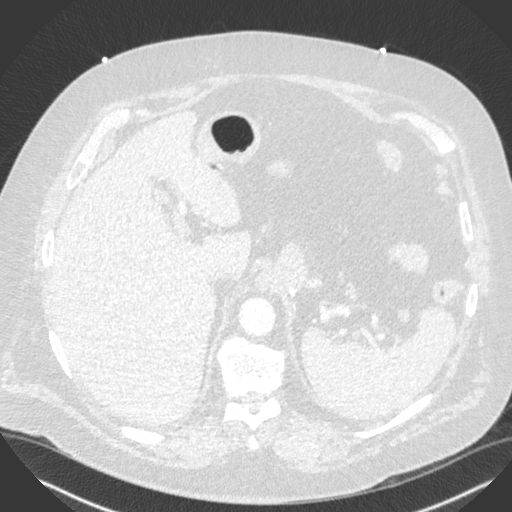
[im 50/371  mediastinal]
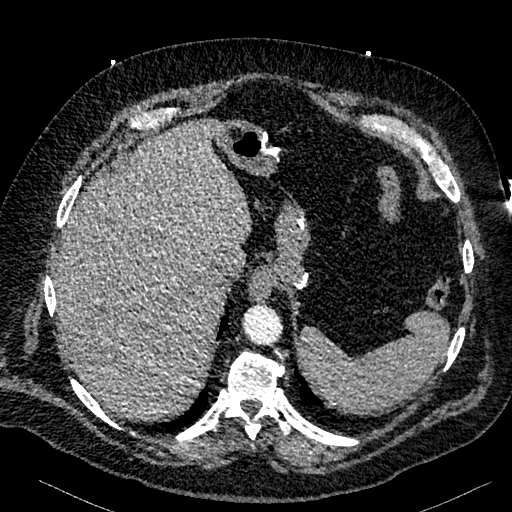
[im 75/371  lung]
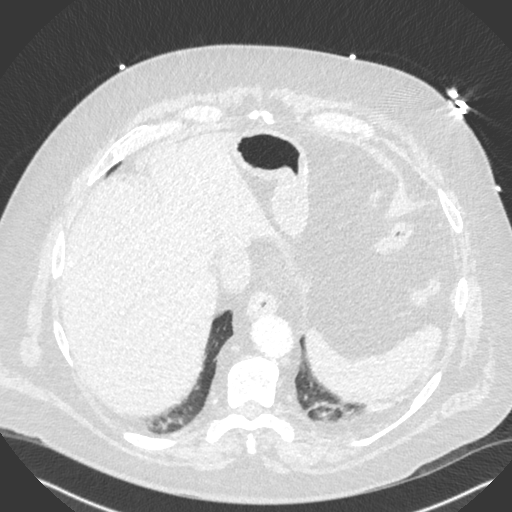
[im 99/371  mediastinal]
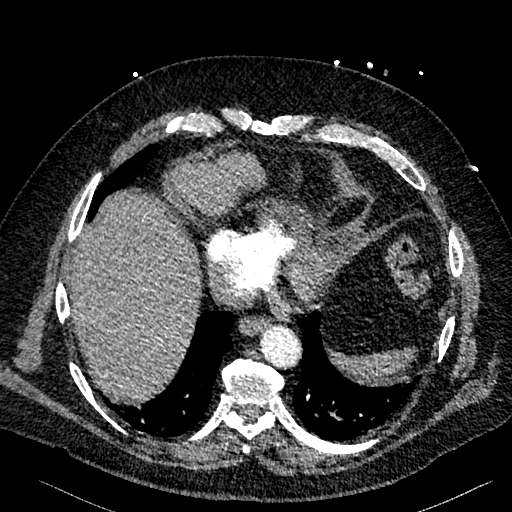
[im 124/371  lung]
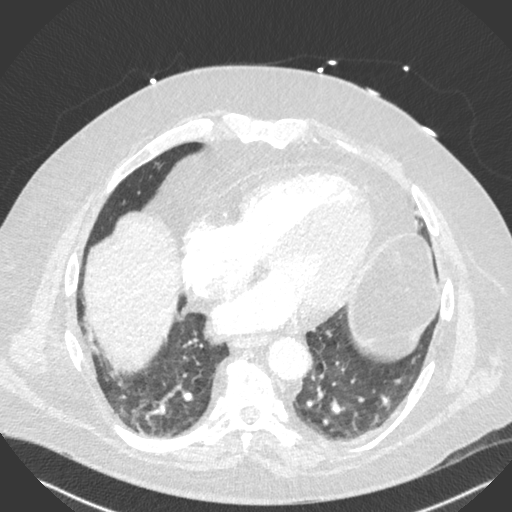
[im 149/371  mediastinal]
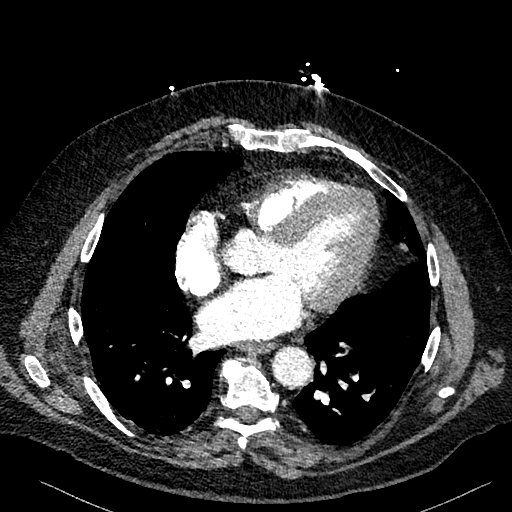
[im 173/371  lung]
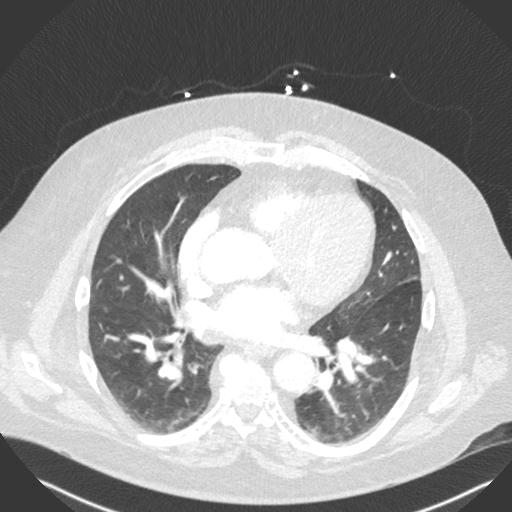
[im 198/371  mediastinal]
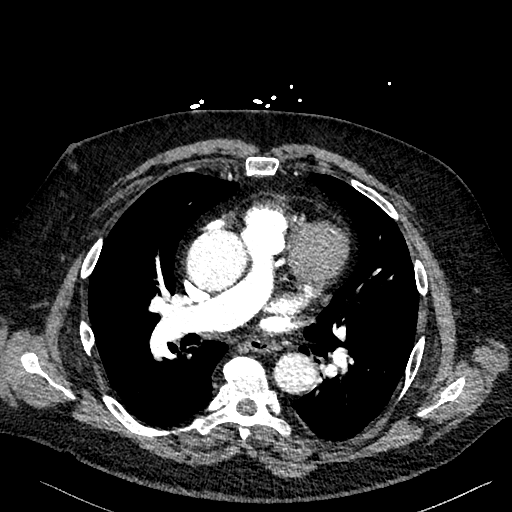
[im 223/371  lung]
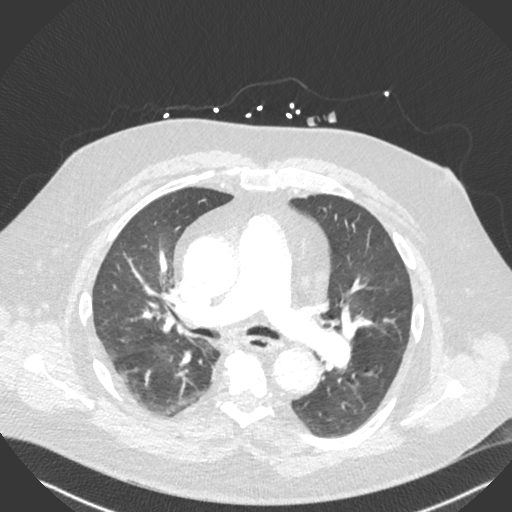
[im 247/371  mediastinal]
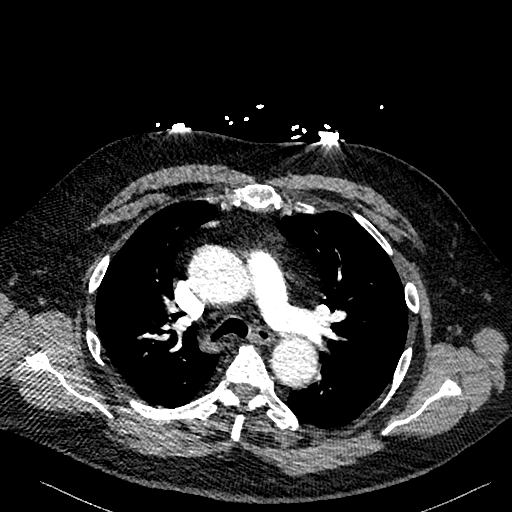
[im 272/371  lung]
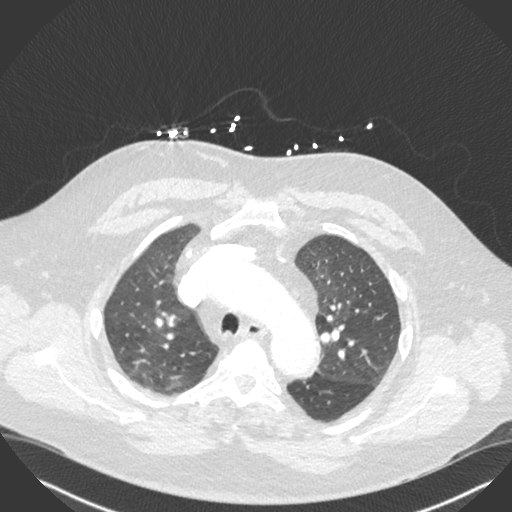
[im 297/371  mediastinal]
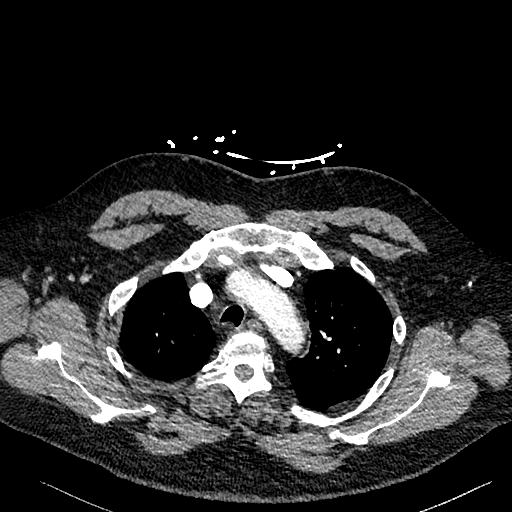
[im 321/371  lung]
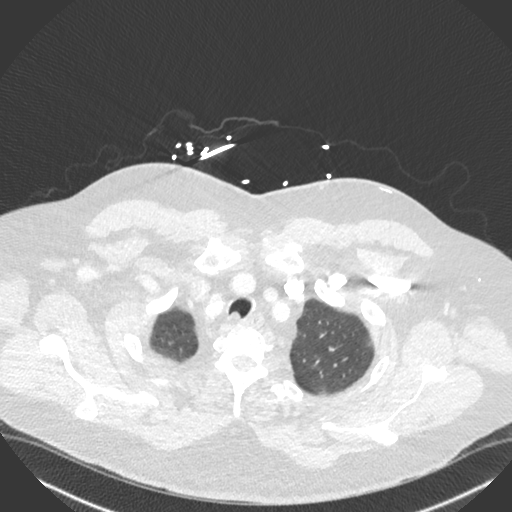
[im 346/371  mediastinal]
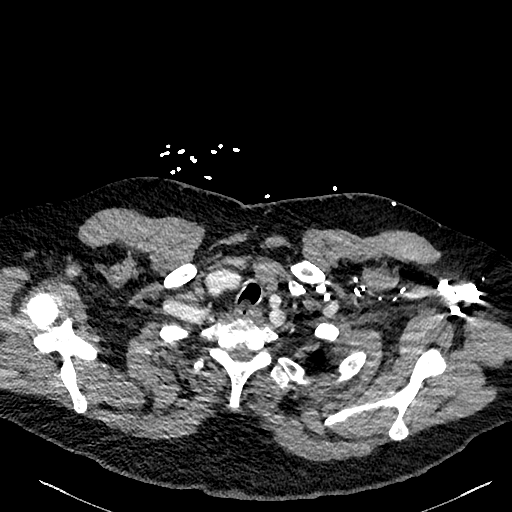

[Series 8: cor · coronal · 0.59mm/px · 1 of 162 slices shown]
[im 81/162  mediastinal]
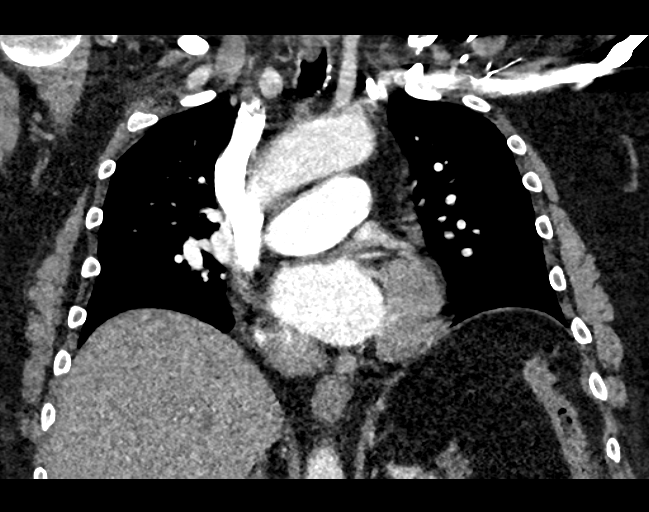

[18 of 36 positions shown; findings below may reference images not displayed]

FINDINGS: Cardiovascular:  There is no evidence of pulmonary embolus.

There is aneurysmal dilatation of the ascending thoracic aorta to
4.9 cm in AP dimension. The aneurysmal dilatation resolves at the
aortic arch. The great vessels are grossly unremarkable.

Mediastinum/Nodes: The mediastinum is unremarkable in appearance. No
mediastinal lymphadenopathy is seen. The visualized portions of the
thyroid gland are unremarkable. No axillary lymphadenopathy is seen.

Lungs/Pleura: Mild bilateral dependent subsegmental atelectasis is
noted. No pleural effusion or pneumothorax is seen. No masses are
identified.

Upper Abdomen: The visualized portions of the liver and spleen are
unremarkable. The patient is status post sleeve gastrectomy.

Musculoskeletal: No acute osseous abnormalities are identified. The
visualized musculature is unremarkable in appearance.

Review of the MIP images confirms the above findings.
IMPRESSION: 1. No evidence of pulmonary embolus.
2. Aneurysmal dilatation of the ascending thoracic aorta to 4.9 cm
in AP dimension. Ascending thoracic aortic aneurysm. Recommend
semi-annual imaging followup by CTA or MRA and referral to
cardiothoracic surgery if not already obtained. This recommendation
follows 6010 ACCF/AHA/AATS/ACR/ASA/SCA/JIM/MELE/MONTE/FABIAN Guidelines
for the Diagnosis and Management of Patients With Thoracic Aortic
Disease. Circulation. 6010; 121: [id number]
3. Mild bilateral dependent subsegmental atelectasis noted; lungs
otherwise clear.

## 2020-12-15 ENCOUNTER — Other Ambulatory Visit: Payer: Self-pay

## 2020-12-15 ENCOUNTER — Emergency Department (HOSPITAL_COMMUNITY)
Admission: EM | Admit: 2020-12-15 | Discharge: 2020-12-15 | Discharge status: Against Medical Advice | Payer: Medicare Other | Attending: Emergency Medicine | Admitting: Emergency Medicine

## 2020-12-15 DIAGNOSIS — R531 Weakness: Secondary | ICD-10-CM | POA: Diagnosis not present

## 2020-12-15 DIAGNOSIS — R42 Dizziness and giddiness: Secondary | ICD-10-CM | POA: Diagnosis not present

## 2020-12-15 DIAGNOSIS — Z5321 Procedure and treatment not carried out due to patient leaving prior to being seen by health care provider: Secondary | ICD-10-CM | POA: Diagnosis not present

## 2020-12-15 LAB — BASIC METABOLIC PANEL
Anion gap: 10 (ref 5–15)
BUN: 12 mg/dL (ref 8–23)
CO2: 24 mmol/L (ref 22–32)
Calcium: 9.5 mg/dL (ref 8.9–10.3)
Chloride: 104 mmol/L (ref 98–111)
Creatinine, Ser: 0.77 mg/dL (ref 0.61–1.24)
GFR, Estimated: >60 mL/min (ref >60)
Glucose, Bld: 137 mg/dL — ABNORMAL HIGH (ref 70–99)
Potassium: 4.3 mmol/L (ref 3.5–5.1)
Sodium: 138 mmol/L (ref 135–145)

## 2020-12-15 LAB — CBC
HCT: 42 % (ref 39.0–52.0)
Hemoglobin: 13.8 g/dL (ref 13.0–17.0)
MCH: 30.5 pg (ref 26.0–34.0)
MCHC: 32.9 g/dL (ref 30.0–36.0)
MCV: 92.7 fL (ref 80.0–100.0)
Platelets: 291 10*3/uL (ref 150–400)
RBC: 4.53 MIL/uL (ref 4.22–5.81)
RDW: 13.1 % (ref 11.5–15.5)
WBC: 9 10*3/uL (ref 4.0–10.5)
nRBC: 0 % (ref 0.0–0.2)

## 2020-12-15 LAB — CBG MONITORING, ED: Glucose-Capillary: 122 mg/dL — ABNORMAL HIGH (ref 70–99)

## 2020-12-15 NOTE — ED Triage Notes (Signed)
Pt arrives via ems from an outdoor event. Pt states when he went from a sitting to a standing position, he became dizzy, light headed, and felt weak all over. Pt states those symptoms lasted about 30 mins, but have resolved.

## 2020-12-15 NOTE — ED Provider Notes (Signed)
Emergency Medicine Provider Triage Evaluation Note  ROCCI SIRCY , a 70 y.o. male  was evaluated in triage.  Pt complains of weak.  Review of Systems  Positive: Dizzy, weak Negative: Cp, headache, focal numbness/weakness, dysuria  Physical Exam  BP (!) 135/93 (BP Location: Left Arm)   Pulse 70   Temp 98.1 F (36.7 C) (Oral)   Resp 16   SpO2 100%  Gen:   Awake, no distress   Resp:  Normal effort  MSK:   Moves extremities without difficulty  Other:    Medical Decision Making  Medically screening exam initiated at 4:15 PM.  Appropriate orders placed.  Ted Mcalpine Breidenbach was informed that the remainder of the evaluation will be completed by another provider, this initial triage assessment does not replace that evaluation, and the importance of remaining in the ED until their evaluation is complete.  Pt was at a [place] event outside for most of the day, got up to walk to his car but felt weak and wobbly.  Did not eat/drink anything throughout the day.     Domenic Moras, PA-C 12/15/20 1617    Milton Ferguson, MD 12/17/20 1726

## 2020-12-15 NOTE — ED Notes (Signed)
Pt called for in ED lobby for room assignment, no answer x1. [employer]
